# Patient Record
Sex: Female | Born: 1960 | Race: White | Hispanic: No | Marital: Single | Smoking: Never smoker
Health system: Southern US, Community
[De-identification: ages and names within clinical notes are randomized; demographics above are authoritative.]

## PROBLEM LIST (undated history)

## (undated) DIAGNOSIS — R51 Headache: Secondary | ICD-10-CM

## (undated) DIAGNOSIS — K5090 Crohn's disease, unspecified, without complications: Secondary | ICD-10-CM

## (undated) DIAGNOSIS — F319 Bipolar disorder, unspecified: Secondary | ICD-10-CM

## (undated) DIAGNOSIS — R569 Unspecified convulsions: Secondary | ICD-10-CM

## (undated) DIAGNOSIS — F329 Major depressive disorder, single episode, unspecified: Secondary | ICD-10-CM

## (undated) DIAGNOSIS — G43909 Migraine, unspecified, not intractable, without status migrainosus: Secondary | ICD-10-CM

## (undated) DIAGNOSIS — K259 Gastric ulcer, unspecified as acute or chronic, without hemorrhage or perforation: Secondary | ICD-10-CM

## (undated) DIAGNOSIS — K219 Gastro-esophageal reflux disease without esophagitis: Secondary | ICD-10-CM

## (undated) DIAGNOSIS — I10 Essential (primary) hypertension: Secondary | ICD-10-CM

## (undated) DIAGNOSIS — F32A Depression, unspecified: Secondary | ICD-10-CM

## (undated) DIAGNOSIS — E7800 Pure hypercholesterolemia, unspecified: Secondary | ICD-10-CM

## (undated) DIAGNOSIS — F419 Anxiety disorder, unspecified: Secondary | ICD-10-CM

## (undated) DIAGNOSIS — B029 Zoster without complications: Secondary | ICD-10-CM

## (undated) DIAGNOSIS — G8929 Other chronic pain: Secondary | ICD-10-CM

## (undated) HISTORY — PX: TONSILLECTOMY: SUR1361

## (undated) HISTORY — PX: STOMACH SURGERY: SHX791

## (undated) HISTORY — PX: CHOLECYSTECTOMY: SHX55

## (undated) HISTORY — PX: TUBAL LIGATION: SHX77

---

## 2004-08-03 ENCOUNTER — Emergency Department (HOSPITAL_COMMUNITY): Admission: EM | Admit: 2004-08-03 | Discharge: 2004-08-03 | Payer: Self-pay | Admitting: Emergency Medicine

## 2004-09-06 ENCOUNTER — Emergency Department (HOSPITAL_COMMUNITY): Admission: EM | Admit: 2004-09-06 | Discharge: 2004-09-06 | Payer: Self-pay | Admitting: Emergency Medicine

## 2004-10-08 ENCOUNTER — Emergency Department (HOSPITAL_COMMUNITY): Admission: EM | Admit: 2004-10-08 | Discharge: 2004-10-08 | Payer: Self-pay | Admitting: Emergency Medicine

## 2004-11-12 ENCOUNTER — Ambulatory Visit: Payer: Self-pay | Admitting: Internal Medicine

## 2004-11-12 ENCOUNTER — Inpatient Hospital Stay (HOSPITAL_COMMUNITY): Admission: EM | Admit: 2004-11-12 | Discharge: 2004-11-16 | Payer: Self-pay | Admitting: Emergency Medicine

## 2004-12-03 ENCOUNTER — Emergency Department (HOSPITAL_COMMUNITY): Admission: EM | Admit: 2004-12-03 | Discharge: 2004-12-03 | Payer: Self-pay | Admitting: Emergency Medicine

## 2004-12-23 ENCOUNTER — Emergency Department (HOSPITAL_COMMUNITY): Admission: EM | Admit: 2004-12-23 | Discharge: 2004-12-23 | Payer: Self-pay | Admitting: Emergency Medicine

## 2005-01-08 ENCOUNTER — Emergency Department (HOSPITAL_COMMUNITY): Admission: EM | Admit: 2005-01-08 | Discharge: 2005-01-08 | Payer: Self-pay | Admitting: Emergency Medicine

## 2005-01-20 ENCOUNTER — Inpatient Hospital Stay (HOSPITAL_COMMUNITY): Admission: EM | Admit: 2005-01-20 | Discharge: 2005-01-23 | Payer: Self-pay | Admitting: Emergency Medicine

## 2005-02-25 ENCOUNTER — Emergency Department (HOSPITAL_COMMUNITY): Admission: EM | Admit: 2005-02-25 | Discharge: 2005-02-25 | Payer: Self-pay | Admitting: Emergency Medicine

## 2005-04-29 ENCOUNTER — Emergency Department (HOSPITAL_COMMUNITY): Admission: EM | Admit: 2005-04-29 | Discharge: 2005-04-29 | Payer: Self-pay | Admitting: Emergency Medicine

## 2005-05-14 ENCOUNTER — Emergency Department (HOSPITAL_COMMUNITY): Admission: EM | Admit: 2005-05-14 | Discharge: 2005-05-14 | Payer: Self-pay | Admitting: Emergency Medicine

## 2005-05-15 ENCOUNTER — Emergency Department (HOSPITAL_COMMUNITY): Admission: EM | Admit: 2005-05-15 | Discharge: 2005-05-15 | Payer: Self-pay | Admitting: Emergency Medicine

## 2005-05-21 ENCOUNTER — Emergency Department (HOSPITAL_COMMUNITY): Admission: EM | Admit: 2005-05-21 | Discharge: 2005-05-21 | Payer: Self-pay | Admitting: Emergency Medicine

## 2005-05-23 ENCOUNTER — Observation Stay (HOSPITAL_COMMUNITY): Admission: EM | Admit: 2005-05-23 | Discharge: 2005-05-24 | Payer: Self-pay | Admitting: Emergency Medicine

## 2005-05-26 ENCOUNTER — Ambulatory Visit: Payer: Self-pay | Admitting: Family Medicine

## 2005-06-09 ENCOUNTER — Emergency Department (HOSPITAL_COMMUNITY): Admission: EM | Admit: 2005-06-09 | Discharge: 2005-06-09 | Payer: Self-pay | Admitting: Emergency Medicine

## 2005-06-10 ENCOUNTER — Emergency Department (HOSPITAL_COMMUNITY): Admission: EM | Admit: 2005-06-10 | Discharge: 2005-06-11 | Payer: Self-pay | Admitting: Emergency Medicine

## 2005-06-10 ENCOUNTER — Emergency Department (HOSPITAL_COMMUNITY): Admission: EM | Admit: 2005-06-10 | Discharge: 2005-06-10 | Payer: Self-pay | Admitting: Emergency Medicine

## 2005-06-14 ENCOUNTER — Emergency Department (HOSPITAL_COMMUNITY): Admission: EM | Admit: 2005-06-14 | Discharge: 2005-06-14 | Payer: Self-pay | Admitting: Emergency Medicine

## 2005-06-22 ENCOUNTER — Emergency Department (HOSPITAL_COMMUNITY): Admission: EM | Admit: 2005-06-22 | Discharge: 2005-06-22 | Payer: Self-pay | Admitting: Emergency Medicine

## 2005-07-07 ENCOUNTER — Emergency Department (HOSPITAL_COMMUNITY): Admission: EM | Admit: 2005-07-07 | Discharge: 2005-07-07 | Payer: Self-pay | Admitting: Emergency Medicine

## 2005-07-08 ENCOUNTER — Emergency Department (HOSPITAL_COMMUNITY): Admission: EM | Admit: 2005-07-08 | Discharge: 2005-07-08 | Payer: Self-pay | Admitting: Emergency Medicine

## 2005-07-09 ENCOUNTER — Observation Stay (HOSPITAL_COMMUNITY): Admission: EM | Admit: 2005-07-09 | Discharge: 2005-07-10 | Payer: Self-pay | Admitting: Emergency Medicine

## 2005-09-14 ENCOUNTER — Emergency Department (HOSPITAL_COMMUNITY): Admission: EM | Admit: 2005-09-14 | Discharge: 2005-09-14 | Payer: Self-pay | Admitting: Emergency Medicine

## 2005-10-12 ENCOUNTER — Emergency Department (HOSPITAL_COMMUNITY): Admission: EM | Admit: 2005-10-12 | Discharge: 2005-10-12 | Payer: Self-pay | Admitting: *Deleted

## 2005-10-16 ENCOUNTER — Emergency Department (HOSPITAL_COMMUNITY): Admission: EM | Admit: 2005-10-16 | Discharge: 2005-10-16 | Payer: Self-pay | Admitting: Emergency Medicine

## 2005-10-26 ENCOUNTER — Emergency Department (HOSPITAL_COMMUNITY): Admission: EM | Admit: 2005-10-26 | Discharge: 2005-10-26 | Payer: Self-pay | Admitting: Emergency Medicine

## 2005-11-06 ENCOUNTER — Emergency Department (HOSPITAL_COMMUNITY): Admission: EM | Admit: 2005-11-06 | Discharge: 2005-11-06 | Payer: Self-pay | Admitting: Emergency Medicine

## 2005-12-10 ENCOUNTER — Emergency Department (HOSPITAL_COMMUNITY): Admission: EM | Admit: 2005-12-10 | Discharge: 2005-12-10 | Payer: Self-pay | Admitting: Emergency Medicine

## 2005-12-15 ENCOUNTER — Emergency Department (HOSPITAL_COMMUNITY): Admission: EM | Admit: 2005-12-15 | Discharge: 2005-12-15 | Payer: Self-pay | Admitting: Emergency Medicine

## 2005-12-30 ENCOUNTER — Emergency Department (HOSPITAL_COMMUNITY): Admission: EM | Admit: 2005-12-30 | Discharge: 2005-12-30 | Payer: Self-pay | Admitting: Emergency Medicine

## 2006-01-06 ENCOUNTER — Ambulatory Visit: Payer: Self-pay | Admitting: Family Medicine

## 2006-01-06 ENCOUNTER — Emergency Department (HOSPITAL_COMMUNITY): Admission: EM | Admit: 2006-01-06 | Discharge: 2006-01-06 | Payer: Self-pay | Admitting: Emergency Medicine

## 2006-01-12 ENCOUNTER — Ambulatory Visit (HOSPITAL_COMMUNITY): Admission: RE | Admit: 2006-01-12 | Discharge: 2006-01-12 | Payer: Self-pay | Admitting: Emergency Medicine

## 2006-01-15 ENCOUNTER — Emergency Department (HOSPITAL_COMMUNITY): Admission: EM | Admit: 2006-01-15 | Discharge: 2006-01-15 | Payer: Self-pay | Admitting: Emergency Medicine

## 2006-01-20 ENCOUNTER — Ambulatory Visit: Payer: Self-pay | Admitting: Family Medicine

## 2006-01-21 ENCOUNTER — Ambulatory Visit (HOSPITAL_COMMUNITY): Admission: RE | Admit: 2006-01-21 | Discharge: 2006-01-21 | Payer: Self-pay | Admitting: Family Medicine

## 2006-01-22 ENCOUNTER — Emergency Department (HOSPITAL_COMMUNITY): Admission: EM | Admit: 2006-01-22 | Discharge: 2006-01-22 | Payer: Self-pay | Admitting: Emergency Medicine

## 2006-02-23 ENCOUNTER — Ambulatory Visit: Payer: Self-pay | Admitting: Family Medicine

## 2006-03-20 ENCOUNTER — Emergency Department (HOSPITAL_COMMUNITY): Admission: EM | Admit: 2006-03-20 | Discharge: 2006-03-20 | Payer: Self-pay | Admitting: Emergency Medicine

## 2006-03-26 ENCOUNTER — Ambulatory Visit: Payer: Self-pay | Admitting: Internal Medicine

## 2006-04-02 ENCOUNTER — Ambulatory Visit (HOSPITAL_COMMUNITY): Admission: RE | Admit: 2006-04-02 | Discharge: 2006-04-02 | Payer: Self-pay | Admitting: Internal Medicine

## 2006-04-09 ENCOUNTER — Emergency Department (HOSPITAL_COMMUNITY): Admission: EM | Admit: 2006-04-09 | Discharge: 2006-04-09 | Payer: Self-pay | Admitting: Emergency Medicine

## 2006-04-11 ENCOUNTER — Emergency Department (HOSPITAL_COMMUNITY): Admission: EM | Admit: 2006-04-11 | Discharge: 2006-04-11 | Payer: Self-pay | Admitting: Emergency Medicine

## 2006-04-14 ENCOUNTER — Ambulatory Visit (HOSPITAL_COMMUNITY): Admission: RE | Admit: 2006-04-14 | Discharge: 2006-04-14 | Payer: Self-pay | Admitting: Internal Medicine

## 2006-04-14 ENCOUNTER — Ambulatory Visit: Payer: Self-pay | Admitting: Internal Medicine

## 2006-04-27 ENCOUNTER — Emergency Department (HOSPITAL_COMMUNITY): Admission: EM | Admit: 2006-04-27 | Discharge: 2006-04-27 | Payer: Self-pay | Admitting: Emergency Medicine

## 2006-04-30 ENCOUNTER — Emergency Department (HOSPITAL_COMMUNITY): Admission: EM | Admit: 2006-04-30 | Discharge: 2006-04-30 | Payer: Self-pay | Admitting: Emergency Medicine

## 2006-05-03 ENCOUNTER — Ambulatory Visit: Payer: Self-pay | Admitting: Internal Medicine

## 2006-05-23 ENCOUNTER — Emergency Department (HOSPITAL_COMMUNITY): Admission: EM | Admit: 2006-05-23 | Discharge: 2006-05-23 | Payer: Self-pay | Admitting: Emergency Medicine

## 2006-06-15 ENCOUNTER — Emergency Department (HOSPITAL_COMMUNITY): Admission: EM | Admit: 2006-06-15 | Discharge: 2006-06-15 | Payer: Self-pay | Admitting: Emergency Medicine

## 2006-06-22 ENCOUNTER — Emergency Department (HOSPITAL_COMMUNITY): Admission: EM | Admit: 2006-06-22 | Discharge: 2006-06-22 | Payer: Self-pay | Admitting: Emergency Medicine

## 2006-06-24 ENCOUNTER — Ambulatory Visit: Payer: Self-pay | Admitting: Gastroenterology

## 2006-06-30 ENCOUNTER — Ambulatory Visit (HOSPITAL_COMMUNITY): Admission: RE | Admit: 2006-06-30 | Discharge: 2006-06-30 | Payer: Self-pay | Admitting: Gastroenterology

## 2006-06-30 ENCOUNTER — Ambulatory Visit: Payer: Self-pay | Admitting: Gastroenterology

## 2006-07-02 ENCOUNTER — Encounter (HOSPITAL_COMMUNITY): Admission: RE | Admit: 2006-07-02 | Discharge: 2006-08-01 | Payer: Self-pay | Admitting: Gastroenterology

## 2006-08-21 ENCOUNTER — Emergency Department (HOSPITAL_COMMUNITY): Admission: EM | Admit: 2006-08-21 | Discharge: 2006-08-21 | Payer: Self-pay | Admitting: Emergency Medicine

## 2006-08-23 ENCOUNTER — Ambulatory Visit: Payer: Self-pay | Admitting: Gastroenterology

## 2006-09-08 ENCOUNTER — Ambulatory Visit (HOSPITAL_COMMUNITY): Admission: RE | Admit: 2006-09-08 | Discharge: 2006-09-08 | Payer: Self-pay | Admitting: Gastroenterology

## 2006-09-08 ENCOUNTER — Ambulatory Visit: Payer: Self-pay | Admitting: Gastroenterology

## 2006-10-04 ENCOUNTER — Emergency Department (HOSPITAL_COMMUNITY): Admission: EM | Admit: 2006-10-04 | Discharge: 2006-10-05 | Payer: Self-pay | Admitting: *Deleted

## 2006-10-20 ENCOUNTER — Emergency Department (HOSPITAL_COMMUNITY): Admission: EM | Admit: 2006-10-20 | Discharge: 2006-10-20 | Payer: Self-pay | Admitting: Emergency Medicine

## 2006-12-05 ENCOUNTER — Emergency Department (HOSPITAL_COMMUNITY): Admission: EM | Admit: 2006-12-05 | Discharge: 2006-12-06 | Payer: Self-pay | Admitting: Emergency Medicine

## 2006-12-07 ENCOUNTER — Observation Stay (HOSPITAL_COMMUNITY): Admission: EM | Admit: 2006-12-07 | Discharge: 2006-12-09 | Payer: Self-pay | Admitting: Emergency Medicine

## 2007-01-08 ENCOUNTER — Emergency Department (HOSPITAL_COMMUNITY): Admission: EM | Admit: 2007-01-08 | Discharge: 2007-01-08 | Payer: Self-pay | Admitting: Emergency Medicine

## 2007-02-07 ENCOUNTER — Emergency Department (HOSPITAL_COMMUNITY): Admission: EM | Admit: 2007-02-07 | Discharge: 2007-02-07 | Payer: Self-pay | Admitting: Emergency Medicine

## 2007-02-23 ENCOUNTER — Emergency Department (HOSPITAL_COMMUNITY): Admission: EM | Admit: 2007-02-23 | Discharge: 2007-02-23 | Payer: Self-pay | Admitting: Emergency Medicine

## 2007-04-07 ENCOUNTER — Emergency Department (HOSPITAL_COMMUNITY): Admission: EM | Admit: 2007-04-07 | Discharge: 2007-04-07 | Payer: Self-pay | Admitting: Emergency Medicine

## 2007-04-19 ENCOUNTER — Emergency Department (HOSPITAL_COMMUNITY): Admission: EM | Admit: 2007-04-19 | Discharge: 2007-04-19 | Payer: Self-pay | Admitting: Emergency Medicine

## 2007-05-05 ENCOUNTER — Emergency Department (HOSPITAL_COMMUNITY): Admission: EM | Admit: 2007-05-05 | Discharge: 2007-05-05 | Payer: Self-pay | Admitting: Emergency Medicine

## 2007-05-09 ENCOUNTER — Emergency Department (HOSPITAL_COMMUNITY): Admission: EM | Admit: 2007-05-09 | Discharge: 2007-05-09 | Payer: Self-pay | Admitting: Emergency Medicine

## 2007-05-25 ENCOUNTER — Emergency Department (HOSPITAL_COMMUNITY): Admission: EM | Admit: 2007-05-25 | Discharge: 2007-05-25 | Payer: Self-pay | Admitting: Emergency Medicine

## 2007-05-26 ENCOUNTER — Emergency Department (HOSPITAL_COMMUNITY): Admission: EM | Admit: 2007-05-26 | Discharge: 2007-05-26 | Payer: Self-pay | Admitting: Emergency Medicine

## 2007-06-13 ENCOUNTER — Emergency Department (HOSPITAL_COMMUNITY): Admission: EM | Admit: 2007-06-13 | Discharge: 2007-06-13 | Payer: Self-pay | Admitting: Emergency Medicine

## 2007-06-17 ENCOUNTER — Emergency Department (HOSPITAL_COMMUNITY): Admission: EM | Admit: 2007-06-17 | Discharge: 2007-06-17 | Payer: Self-pay | Admitting: Emergency Medicine

## 2007-06-23 ENCOUNTER — Emergency Department (HOSPITAL_COMMUNITY): Admission: EM | Admit: 2007-06-23 | Discharge: 2007-06-23 | Payer: Self-pay | Admitting: Emergency Medicine

## 2007-07-02 ENCOUNTER — Emergency Department (HOSPITAL_COMMUNITY): Admission: EM | Admit: 2007-07-02 | Discharge: 2007-07-02 | Payer: Self-pay | Admitting: *Deleted

## 2007-07-05 ENCOUNTER — Emergency Department (HOSPITAL_COMMUNITY): Admission: EM | Admit: 2007-07-05 | Discharge: 2007-07-05 | Payer: Self-pay | Admitting: *Deleted

## 2007-07-21 ENCOUNTER — Emergency Department (HOSPITAL_COMMUNITY): Admission: EM | Admit: 2007-07-21 | Discharge: 2007-07-21 | Payer: Self-pay | Admitting: Emergency Medicine

## 2007-08-01 ENCOUNTER — Emergency Department (HOSPITAL_COMMUNITY): Admission: EM | Admit: 2007-08-01 | Discharge: 2007-08-01 | Payer: Self-pay | Admitting: Emergency Medicine

## 2007-08-10 ENCOUNTER — Emergency Department (HOSPITAL_COMMUNITY): Admission: EM | Admit: 2007-08-10 | Discharge: 2007-08-10 | Payer: Self-pay | Admitting: Emergency Medicine

## 2007-08-15 ENCOUNTER — Emergency Department (HOSPITAL_COMMUNITY): Admission: EM | Admit: 2007-08-15 | Discharge: 2007-08-15 | Payer: Self-pay | Admitting: Emergency Medicine

## 2007-08-19 ENCOUNTER — Emergency Department (HOSPITAL_COMMUNITY): Admission: EM | Admit: 2007-08-19 | Discharge: 2007-08-19 | Payer: Self-pay | Admitting: Emergency Medicine

## 2007-08-28 ENCOUNTER — Emergency Department (HOSPITAL_COMMUNITY): Admission: EM | Admit: 2007-08-28 | Discharge: 2007-08-28 | Payer: Self-pay | Admitting: Emergency Medicine

## 2007-09-07 ENCOUNTER — Emergency Department (HOSPITAL_COMMUNITY): Admission: EM | Admit: 2007-09-07 | Discharge: 2007-09-07 | Payer: Self-pay | Admitting: Emergency Medicine

## 2007-09-14 ENCOUNTER — Emergency Department (HOSPITAL_COMMUNITY): Admission: EM | Admit: 2007-09-14 | Discharge: 2007-09-14 | Payer: Self-pay | Admitting: Emergency Medicine

## 2007-09-16 ENCOUNTER — Emergency Department (HOSPITAL_COMMUNITY): Admission: EM | Admit: 2007-09-16 | Discharge: 2007-09-17 | Payer: Self-pay | Admitting: Emergency Medicine

## 2007-09-22 ENCOUNTER — Emergency Department (HOSPITAL_COMMUNITY): Admission: EM | Admit: 2007-09-22 | Discharge: 2007-09-22 | Payer: Self-pay | Admitting: Emergency Medicine

## 2007-10-15 ENCOUNTER — Emergency Department (HOSPITAL_COMMUNITY): Admission: EM | Admit: 2007-10-15 | Discharge: 2007-10-15 | Payer: Self-pay | Admitting: Emergency Medicine

## 2007-11-04 ENCOUNTER — Emergency Department (HOSPITAL_COMMUNITY): Admission: EM | Admit: 2007-11-04 | Discharge: 2007-11-04 | Payer: Self-pay | Admitting: Emergency Medicine

## 2007-11-26 ENCOUNTER — Emergency Department (HOSPITAL_COMMUNITY): Admission: EM | Admit: 2007-11-26 | Discharge: 2007-11-26 | Payer: Self-pay | Admitting: Emergency Medicine

## 2007-11-27 ENCOUNTER — Emergency Department (HOSPITAL_COMMUNITY): Admission: EM | Admit: 2007-11-27 | Discharge: 2007-11-27 | Payer: Self-pay | Admitting: Emergency Medicine

## 2007-11-27 ENCOUNTER — Emergency Department (HOSPITAL_COMMUNITY): Admission: EM | Admit: 2007-11-27 | Discharge: 2007-11-28 | Payer: Self-pay | Admitting: Emergency Medicine

## 2007-11-29 ENCOUNTER — Ambulatory Visit: Payer: Self-pay | Admitting: Internal Medicine

## 2007-12-25 ENCOUNTER — Emergency Department (HOSPITAL_COMMUNITY): Admission: EM | Admit: 2007-12-25 | Discharge: 2007-12-25 | Payer: Self-pay | Admitting: Emergency Medicine

## 2007-12-27 ENCOUNTER — Emergency Department (HOSPITAL_COMMUNITY): Admission: EM | Admit: 2007-12-27 | Discharge: 2007-12-27 | Payer: Self-pay | Admitting: Emergency Medicine

## 2008-01-19 ENCOUNTER — Ambulatory Visit: Payer: Self-pay | Admitting: Gastroenterology

## 2008-01-19 ENCOUNTER — Emergency Department (HOSPITAL_COMMUNITY): Admission: EM | Admit: 2008-01-19 | Discharge: 2008-01-19 | Payer: Self-pay | Admitting: Emergency Medicine

## 2008-02-24 ENCOUNTER — Emergency Department (HOSPITAL_COMMUNITY): Admission: EM | Admit: 2008-02-24 | Discharge: 2008-02-24 | Payer: Self-pay | Admitting: Emergency Medicine

## 2008-02-28 ENCOUNTER — Emergency Department (HOSPITAL_COMMUNITY): Admission: EM | Admit: 2008-02-28 | Discharge: 2008-02-28 | Payer: Self-pay | Admitting: Emergency Medicine

## 2008-02-29 ENCOUNTER — Emergency Department (HOSPITAL_COMMUNITY): Admission: EM | Admit: 2008-02-29 | Discharge: 2008-02-29 | Payer: Self-pay | Admitting: Emergency Medicine

## 2008-04-04 ENCOUNTER — Emergency Department (HOSPITAL_COMMUNITY): Admission: EM | Admit: 2008-04-04 | Discharge: 2008-04-04 | Payer: Self-pay | Admitting: Emergency Medicine

## 2008-05-04 ENCOUNTER — Emergency Department (HOSPITAL_COMMUNITY): Admission: EM | Admit: 2008-05-04 | Discharge: 2008-05-04 | Payer: Self-pay | Admitting: Emergency Medicine

## 2008-06-04 ENCOUNTER — Emergency Department (HOSPITAL_COMMUNITY): Admission: EM | Admit: 2008-06-04 | Discharge: 2008-06-04 | Payer: Self-pay | Admitting: Emergency Medicine

## 2008-06-06 ENCOUNTER — Emergency Department (HOSPITAL_COMMUNITY): Admission: EM | Admit: 2008-06-06 | Discharge: 2008-06-06 | Payer: Self-pay | Admitting: Emergency Medicine

## 2008-07-07 ENCOUNTER — Emergency Department (HOSPITAL_COMMUNITY): Admission: EM | Admit: 2008-07-07 | Discharge: 2008-07-07 | Payer: Self-pay | Admitting: Emergency Medicine

## 2008-08-25 ENCOUNTER — Emergency Department (HOSPITAL_COMMUNITY): Admission: EM | Admit: 2008-08-25 | Discharge: 2008-08-26 | Payer: Self-pay | Admitting: Emergency Medicine

## 2008-08-31 ENCOUNTER — Ambulatory Visit: Payer: Self-pay | Admitting: Gastroenterology

## 2008-10-06 ENCOUNTER — Emergency Department (HOSPITAL_COMMUNITY): Admission: EM | Admit: 2008-10-06 | Discharge: 2008-10-06 | Payer: Self-pay | Admitting: Emergency Medicine

## 2009-01-25 ENCOUNTER — Telehealth (INDEPENDENT_AMBULATORY_CARE_PROVIDER_SITE_OTHER): Payer: Self-pay

## 2009-01-25 ENCOUNTER — Ambulatory Visit: Payer: Self-pay | Admitting: Gastroenterology

## 2009-01-28 ENCOUNTER — Encounter: Payer: Self-pay | Admitting: Urgent Care

## 2009-01-30 ENCOUNTER — Telehealth (INDEPENDENT_AMBULATORY_CARE_PROVIDER_SITE_OTHER): Payer: Self-pay

## 2009-01-30 ENCOUNTER — Ambulatory Visit: Payer: Self-pay | Admitting: Gastroenterology

## 2009-02-01 ENCOUNTER — Telehealth (INDEPENDENT_AMBULATORY_CARE_PROVIDER_SITE_OTHER): Payer: Self-pay

## 2009-02-01 LAB — CONVERTED CEMR LAB
Eosinophils Absolute: 0.2 10*3/uL (ref 0.0–0.7)
Eosinophils Relative: 2 % (ref 0–5)
HCT: 31.8 % — ABNORMAL LOW (ref 36.0–46.0)
Hemoglobin: 9.2 g/dL — ABNORMAL LOW (ref 12.0–15.0)
Lymphocytes Relative: 41 % (ref 12–46)
Lymphs Abs: 3 10*3/uL (ref 0.7–4.0)
MCV: 73.8 fL — ABNORMAL LOW (ref 78.0–100.0)
Monocytes Absolute: 0.4 10*3/uL (ref 0.1–1.0)
Monocytes Relative: 5 % (ref 3–12)
RBC: 4.31 M/uL (ref 3.87–5.11)
Tissue Transglutaminase Ab, IgA: 0.4 units (ref <7)
UIBC: 512 ug/dL
Vitamin B-12: 446 pg/mL (ref 211–911)
WBC: 7.3 10*3/uL (ref 4.0–10.5)

## 2009-02-11 ENCOUNTER — Ambulatory Visit (HOSPITAL_COMMUNITY): Payer: Self-pay | Admitting: Oncology

## 2009-02-12 ENCOUNTER — Encounter: Payer: Self-pay | Admitting: Gastroenterology

## 2009-02-13 ENCOUNTER — Telehealth (INDEPENDENT_AMBULATORY_CARE_PROVIDER_SITE_OTHER): Payer: Self-pay

## 2009-02-14 ENCOUNTER — Encounter: Payer: Self-pay | Admitting: Gastroenterology

## 2009-02-14 ENCOUNTER — Ambulatory Visit (HOSPITAL_COMMUNITY): Admission: RE | Admit: 2009-02-14 | Discharge: 2009-02-14 | Payer: Self-pay | Admitting: Internal Medicine

## 2009-02-14 ENCOUNTER — Ambulatory Visit: Payer: Self-pay | Admitting: Gastroenterology

## 2009-02-18 ENCOUNTER — Telehealth: Payer: Self-pay | Admitting: Gastroenterology

## 2009-02-18 ENCOUNTER — Encounter: Payer: Self-pay | Admitting: Gastroenterology

## 2009-02-19 ENCOUNTER — Telehealth: Payer: Self-pay | Admitting: Gastroenterology

## 2009-02-19 ENCOUNTER — Ambulatory Visit (HOSPITAL_COMMUNITY): Admission: RE | Admit: 2009-02-19 | Discharge: 2009-02-19 | Payer: Self-pay | Admitting: Oncology

## 2009-02-19 ENCOUNTER — Encounter: Payer: Self-pay | Admitting: Gastroenterology

## 2009-02-22 ENCOUNTER — Encounter: Payer: Self-pay | Admitting: Gastroenterology

## 2009-02-25 ENCOUNTER — Encounter (INDEPENDENT_AMBULATORY_CARE_PROVIDER_SITE_OTHER): Payer: Self-pay

## 2009-02-25 ENCOUNTER — Ambulatory Visit (HOSPITAL_COMMUNITY): Admission: RE | Admit: 2009-02-25 | Discharge: 2009-02-25 | Payer: Self-pay | Admitting: Gastroenterology

## 2009-03-05 ENCOUNTER — Telehealth: Payer: Self-pay | Admitting: Gastroenterology

## 2009-03-05 ENCOUNTER — Encounter (INDEPENDENT_AMBULATORY_CARE_PROVIDER_SITE_OTHER): Payer: Self-pay | Admitting: *Deleted

## 2009-03-15 ENCOUNTER — Encounter: Payer: Self-pay | Admitting: Gastroenterology

## 2009-03-20 ENCOUNTER — Inpatient Hospital Stay (HOSPITAL_COMMUNITY): Admission: EM | Admit: 2009-03-20 | Discharge: 2009-03-27 | Payer: Self-pay | Admitting: Emergency Medicine

## 2009-03-21 ENCOUNTER — Ambulatory Visit: Payer: Self-pay | Admitting: Internal Medicine

## 2009-03-22 ENCOUNTER — Encounter: Payer: Self-pay | Admitting: Internal Medicine

## 2009-03-23 ENCOUNTER — Ambulatory Visit: Payer: Self-pay | Admitting: Gastroenterology

## 2009-03-25 ENCOUNTER — Ambulatory Visit: Payer: Self-pay | Admitting: Internal Medicine

## 2009-03-26 ENCOUNTER — Ambulatory Visit: Payer: Self-pay | Admitting: Gastroenterology

## 2009-03-27 ENCOUNTER — Ambulatory Visit: Payer: Self-pay | Admitting: Gastroenterology

## 2009-04-02 ENCOUNTER — Observation Stay (HOSPITAL_COMMUNITY): Admission: EM | Admit: 2009-04-02 | Discharge: 2009-04-03 | Payer: Self-pay | Admitting: Obstetrics and Gynecology

## 2009-04-04 ENCOUNTER — Encounter: Payer: Self-pay | Admitting: Gastroenterology

## 2009-04-09 ENCOUNTER — Telehealth (INDEPENDENT_AMBULATORY_CARE_PROVIDER_SITE_OTHER): Payer: Self-pay

## 2009-04-17 ENCOUNTER — Telehealth: Payer: Self-pay | Admitting: Gastroenterology

## 2009-05-02 ENCOUNTER — Encounter: Payer: Self-pay | Admitting: Gastroenterology

## 2009-05-02 ENCOUNTER — Telehealth (INDEPENDENT_AMBULATORY_CARE_PROVIDER_SITE_OTHER): Payer: Self-pay

## 2009-05-08 ENCOUNTER — Encounter: Payer: Self-pay | Admitting: Gastroenterology

## 2009-05-08 ENCOUNTER — Telehealth (INDEPENDENT_AMBULATORY_CARE_PROVIDER_SITE_OTHER): Payer: Self-pay

## 2009-05-27 ENCOUNTER — Inpatient Hospital Stay (HOSPITAL_COMMUNITY): Admission: EM | Admit: 2009-05-27 | Discharge: 2009-05-28 | Payer: Self-pay | Admitting: Emergency Medicine

## 2009-06-04 ENCOUNTER — Telehealth (INDEPENDENT_AMBULATORY_CARE_PROVIDER_SITE_OTHER): Payer: Self-pay

## 2009-06-07 ENCOUNTER — Emergency Department (HOSPITAL_COMMUNITY): Admission: EM | Admit: 2009-06-07 | Discharge: 2009-06-07 | Payer: Self-pay | Admitting: Emergency Medicine

## 2009-06-11 DIAGNOSIS — K3184 Gastroparesis: Secondary | ICD-10-CM

## 2009-06-11 DIAGNOSIS — R569 Unspecified convulsions: Secondary | ICD-10-CM

## 2009-06-11 DIAGNOSIS — K6289 Other specified diseases of anus and rectum: Secondary | ICD-10-CM

## 2009-06-11 DIAGNOSIS — K7689 Other specified diseases of liver: Secondary | ICD-10-CM

## 2009-06-11 DIAGNOSIS — Z8719 Personal history of other diseases of the digestive system: Secondary | ICD-10-CM

## 2009-06-11 DIAGNOSIS — K5900 Constipation, unspecified: Secondary | ICD-10-CM | POA: Insufficient documentation

## 2009-06-11 DIAGNOSIS — F329 Major depressive disorder, single episode, unspecified: Secondary | ICD-10-CM

## 2009-06-11 DIAGNOSIS — G589 Mononeuropathy, unspecified: Secondary | ICD-10-CM | POA: Insufficient documentation

## 2009-06-11 DIAGNOSIS — M549 Dorsalgia, unspecified: Secondary | ICD-10-CM | POA: Insufficient documentation

## 2009-06-11 DIAGNOSIS — M129 Arthropathy, unspecified: Secondary | ICD-10-CM | POA: Insufficient documentation

## 2009-06-11 DIAGNOSIS — R109 Unspecified abdominal pain: Secondary | ICD-10-CM | POA: Insufficient documentation

## 2009-06-11 DIAGNOSIS — J189 Pneumonia, unspecified organism: Secondary | ICD-10-CM

## 2009-06-11 DIAGNOSIS — I10 Essential (primary) hypertension: Secondary | ICD-10-CM | POA: Insufficient documentation

## 2009-06-11 DIAGNOSIS — N390 Urinary tract infection, site not specified: Secondary | ICD-10-CM

## 2009-06-11 DIAGNOSIS — F411 Generalized anxiety disorder: Secondary | ICD-10-CM | POA: Insufficient documentation

## 2009-06-11 DIAGNOSIS — G43909 Migraine, unspecified, not intractable, without status migrainosus: Secondary | ICD-10-CM | POA: Insufficient documentation

## 2009-06-11 DIAGNOSIS — D649 Anemia, unspecified: Secondary | ICD-10-CM | POA: Insufficient documentation

## 2009-06-29 ENCOUNTER — Emergency Department (HOSPITAL_COMMUNITY): Admission: EM | Admit: 2009-06-29 | Discharge: 2009-06-29 | Payer: Self-pay | Admitting: Emergency Medicine

## 2009-07-05 ENCOUNTER — Telehealth (INDEPENDENT_AMBULATORY_CARE_PROVIDER_SITE_OTHER): Payer: Self-pay | Admitting: *Deleted

## 2009-07-30 ENCOUNTER — Emergency Department (HOSPITAL_COMMUNITY): Admission: EM | Admit: 2009-07-30 | Discharge: 2009-07-30 | Payer: Self-pay | Admitting: Emergency Medicine

## 2009-08-17 ENCOUNTER — Emergency Department (HOSPITAL_COMMUNITY): Admission: EM | Admit: 2009-08-17 | Discharge: 2009-08-18 | Payer: Self-pay | Admitting: Emergency Medicine

## 2009-09-06 ENCOUNTER — Emergency Department (HOSPITAL_COMMUNITY): Admission: EM | Admit: 2009-09-06 | Discharge: 2009-09-06 | Payer: Self-pay | Admitting: Emergency Medicine

## 2009-10-06 ENCOUNTER — Inpatient Hospital Stay (HOSPITAL_COMMUNITY): Admission: EM | Admit: 2009-10-06 | Discharge: 2009-10-13 | Payer: Self-pay | Admitting: Emergency Medicine

## 2009-10-09 ENCOUNTER — Encounter: Payer: Self-pay | Admitting: Gastroenterology

## 2009-12-01 ENCOUNTER — Emergency Department (HOSPITAL_COMMUNITY): Admission: EM | Admit: 2009-12-01 | Discharge: 2009-12-01 | Payer: Self-pay | Admitting: Emergency Medicine

## 2009-12-03 ENCOUNTER — Emergency Department (HOSPITAL_COMMUNITY): Admission: EM | Admit: 2009-12-03 | Discharge: 2009-12-03 | Payer: Self-pay | Admitting: Emergency Medicine

## 2010-02-15 ENCOUNTER — Emergency Department (HOSPITAL_COMMUNITY): Admission: EM | Admit: 2010-02-15 | Discharge: 2010-02-15 | Payer: Self-pay | Admitting: Emergency Medicine

## 2010-04-10 ENCOUNTER — Telehealth (INDEPENDENT_AMBULATORY_CARE_PROVIDER_SITE_OTHER): Payer: Self-pay

## 2010-04-21 ENCOUNTER — Encounter: Payer: Self-pay | Admitting: Urgent Care

## 2010-05-05 ENCOUNTER — Telehealth (INDEPENDENT_AMBULATORY_CARE_PROVIDER_SITE_OTHER): Payer: Self-pay

## 2010-05-15 ENCOUNTER — Encounter (INDEPENDENT_AMBULATORY_CARE_PROVIDER_SITE_OTHER): Payer: Self-pay | Admitting: *Deleted

## 2010-05-15 ENCOUNTER — Inpatient Hospital Stay (HOSPITAL_COMMUNITY): Admission: EM | Admit: 2010-05-15 | Discharge: 2010-05-17 | Payer: Self-pay | Admitting: Emergency Medicine

## 2010-05-30 ENCOUNTER — Encounter: Payer: Self-pay | Admitting: Internal Medicine

## 2010-06-06 ENCOUNTER — Encounter: Admission: RE | Admit: 2010-06-06 | Discharge: 2010-06-06 | Payer: Self-pay | Admitting: Family Medicine

## 2010-06-26 ENCOUNTER — Emergency Department (HOSPITAL_COMMUNITY): Admission: EM | Admit: 2010-06-26 | Discharge: 2010-06-26 | Payer: Self-pay | Admitting: Emergency Medicine

## 2010-07-16 ENCOUNTER — Ambulatory Visit: Payer: Self-pay | Admitting: Vascular Surgery

## 2010-07-23 ENCOUNTER — Ambulatory Visit: Payer: Self-pay | Admitting: Vascular Surgery

## 2010-07-28 ENCOUNTER — Emergency Department (HOSPITAL_COMMUNITY): Admission: EM | Admit: 2010-07-28 | Discharge: 2010-07-28 | Payer: Self-pay | Admitting: Emergency Medicine

## 2010-10-25 ENCOUNTER — Emergency Department (HOSPITAL_COMMUNITY): Admission: EM | Admit: 2010-10-25 | Discharge: 2010-10-26 | Payer: Self-pay | Admitting: Emergency Medicine

## 2010-12-21 ENCOUNTER — Encounter: Payer: Self-pay | Admitting: Internal Medicine

## 2010-12-21 ENCOUNTER — Encounter: Payer: Self-pay | Admitting: Preventative Medicine

## 2010-12-21 ENCOUNTER — Encounter: Payer: Self-pay | Admitting: Emergency Medicine

## 2010-12-31 NOTE — Letter (Signed)
Summary: DC from practice acknowledged  DC from practice acknowledged   Imported By: Minna Merritts 05/30/2010 11:29:50  _____________________________________________________________________  External Attachment:    Type:   Image     Comment:   External Document

## 2010-12-31 NOTE — Progress Notes (Signed)
Summary: pain rx  Phone Note Call from Patient Call back at Home Phone [phone]   Caller: Patient Summary of Call: pt called- she has had a headache with vomiting and diarrhea (? migraine) for 2 days. pt does not have a pcp. She stated she has tried to take excederin and bc powders and it made her have increased diarrhea. She also said she was trying to drink ensure and couldnt keep it down. informed her she should stay away from dairy products when having vomiting and diarrhea. also advise clear liquids untill diarrhea stops and them brat diet and tylenol for her headache. pt stated she didnt think that would work and wanted something called in for pain. Advised pt that she has not been seen since 12/2008 and has "no-showed" several appts and that I could not guarantee that we could call in anything. pt stated she was aware of this. please advise.. Initial call taken by: Hendricks Limes LPN,  Apr 10, 2010 12:25 PM     Appended Document: pain rx Pt need to go to ED if she needs to be evaluated. We would be more than happy to make a follow up appt. She needs an E:30 visit and 1st available slot. Because she has not been seen in over a year, we cannot call in any meds for her. She may use Tylenol and/or Imodium as needed for diarrhea.  Appended Document: pain rx tried to call pt- LMOM  Appended Document: pain rx LMOM to call.  Appended Document: pain rx Pt called and was informed. Said she is doing some better, but would like an appt.  Appended Document: pain rx Pt on the schedule for 05/12/10@11 :00 a.m.,called pt, lmom.

## 2010-12-31 NOTE — Medication Information (Signed)
Summary: Tax adviser   Imported By: Diana Eves 04/21/2010 11:51:35  _____________________________________________________________________  External Attachment:    Type:   Image     Comment:   External Document  Appended Document: RX Folder Please let pt know she needs to be seen 1st  Appended Document: RX Folder tried to call pt NA- pharmacy aware to let pt know

## 2010-12-31 NOTE — Progress Notes (Signed)
Summary: phone note...Marland KitchenMarland Kitchenn/v diarrhea/ abd pain  Phone Note Call from Patient   Caller: Patient Summary of Call: Pt called and said she is having n/v and diarrhea today. She also has a migraine  (was seen in ER yesterday for that).  She said she has had very bad abdominal pain. Only had baby food to eat. Please advise.  [phone])   uses [employer]. Initial call taken by: Cloria Spring LPN,  May 05, 5620 3:31 PM     Appended Document: phone note...Marland KitchenMarland Kitchenn/v diarrhea/ abd pain VM from pt just now, pleading for pain med to be called in for her. (sending Dr. Darrick Penna page)  Appended Document: phone note...Marland KitchenMarland Kitchenn/v diarrhea/ abd pain LMOM  i have not heard from Dr. Darrick Penna, if she is hurting that bad, she might should go to the hospital.  Appended Document: phone note...Marland KitchenMarland Kitchenn/v diarrhea/ abd pain Pt has not been seen in over a year. She needs 1st available OPV, E:30, RE: abd pain. She needs to see her PCP or go to the ED for evaluation until then.  Appended Document: phone note...Marland KitchenMarland Kitchenn/v diarrhea/ abd pain tried to call pt- LMOM  Appended Document: phone note...Marland KitchenMarland Kitchenn/v diarrhea/ abd pain pt aware.pt wanted me to let SLF know that she doesnt have a PCP and went to the hosp in [place], [place] the other day. pt is claiming that there is something wrong with her stomach and causing her to double over in pain. She has appt on 05/12/10. would like to be informed if anyone cancels their appt.  Appended Document: phone note...Marland KitchenMarland Kitchenn/v diarrhea/ abd pain Obtain records from ED in [facility].  Appended Document: phone note...Marland KitchenMarland Kitchenn/v diarrhea/ abd pain I tried calling pt at [phone], but the voice mailbox is full where I couldnt leave a message. I need to get pt to sign a release of information before I can get any records from [facility] ED.   Appended Document: phone note...Marland KitchenMarland Kitchenn/v diarrhea/ abd pain Pt called Friday 6/10 and I told her that I needed for her to call [facility] to see if they would fax [place]  the records of her visit or for her to sign release on Monday when she comes to her appt.. Pt was aware of appointment for 6/13 w/KJ

## 2010-12-31 NOTE — Letter (Signed)
Summary: Patient Discharge Letter  [facility]  [address]   [place], [place] [address]   Phone: [phone]  Fax: [phone]    May 15, 2010  [name] [address] [place], [place]  [address] [birth year]   Dear Ms. Brassell,   This is to inform you our intention to terminate the physician/patient relationship.  The physician/patient relationship is no longer productive due to the frequency of your missed appointments.  We we will no longer be responsible for your care effective July 15,2011.  Upon proper authorization, we will be glad to provide a copy of your medical records to the Gastroenterologist of your choice.  You may consult your family physician and/or yellow pages of the phone directory to find another qualified Gastroenterologist to provide your care.  It is important that you follow up with another Gastroenterologist for your medical needs.  Please do not neglect your health.   Best regards,      Sandi L. Darrick Penna, MD [facility] Ph: [phone]    Fax: [phone]

## 2011-02-15 LAB — BASIC METABOLIC PANEL
CO2: 22 mEq/L (ref 19–32)
CO2: 24 mEq/L (ref 19–32)
CO2: 20 mEq/L (ref 19–32)
Calcium: 9.7 mg/dL (ref 8.4–10.5)
Calcium: 9.1 mg/dL (ref 8.4–10.5)
Chloride: 104 mEq/L (ref 96–112)
Chloride: 99 mEq/L (ref 96–112)
GFR calc Af Amer: 46 mL/min — ABNORMAL LOW (ref >60)
GFR calc Af Amer: >60 mL/min (ref >60)
GFR calc Af Amer: 9 mL/min — ABNORMAL LOW (ref >60)
GFR calc non Af Amer: 59 mL/min — ABNORMAL LOW (ref >60)
Glucose, Bld: 104 mg/dL — ABNORMAL HIGH (ref 70–99)
Potassium: 3.7 mEq/L (ref 3.5–5.1)
Sodium: 133 mEq/L — ABNORMAL LOW (ref 135–145)
Sodium: 137 mEq/L (ref 135–145)
Sodium: 131 mEq/L — ABNORMAL LOW (ref 135–145)

## 2011-02-15 LAB — RENAL FUNCTION PANEL
BUN: 14 mg/dL (ref 6–23)
Chloride: 114 mEq/L — ABNORMAL HIGH (ref 96–112)
Creatinine, Ser: 1.05 mg/dL (ref 0.4–1.2)
Glucose, Bld: 114 mg/dL — ABNORMAL HIGH (ref 70–99)
Potassium: 4.6 mEq/L (ref 3.5–5.1)

## 2011-02-15 LAB — RETICULOCYTES
RBC.: 3.25 MIL/uL — ABNORMAL LOW (ref 3.87–5.11)
Retic Count, Absolute: 68.3 10*3/uL (ref 19.0–186.0)
Retic Ct Pct: 2.1 % (ref 0.4–3.1)

## 2011-02-15 LAB — COMPREHENSIVE METABOLIC PANEL
ALT: 17 U/L (ref 0–35)
ALT: 18 U/L (ref 0–35)
AST: 14 U/L (ref 0–37)
Albumin: 3.5 g/dL (ref 3.5–5.2)
BUN: 49 mg/dL — ABNORMAL HIGH (ref 6–23)
CO2: 19 mEq/L (ref 19–32)
CO2: 22 mEq/L (ref 19–32)
Calcium: 9 mg/dL (ref 8.4–10.5)
Chloride: 110 mEq/L (ref 96–112)
Creatinine, Ser: 1.98 mg/dL — ABNORMAL HIGH (ref 0.4–1.2)
Creatinine, Ser: 3.79 mg/dL — ABNORMAL HIGH (ref 0.4–1.2)
GFR calc Af Amer: 32 mL/min — ABNORMAL LOW (ref >60)
GFR calc non Af Amer: 13 mL/min — ABNORMAL LOW (ref >60)
GFR calc non Af Amer: 27 mL/min — ABNORMAL LOW (ref >60)
Glucose, Bld: 88 mg/dL (ref 70–99)
Sodium: 130 mEq/L — ABNORMAL LOW (ref 135–145)
Sodium: 137 mEq/L (ref 135–145)
Total Bilirubin: 0.6 mg/dL (ref 0.3–1.2)
Total Protein: 7.1 g/dL (ref 6.0–8.3)

## 2011-02-15 LAB — CBC
HCT: 33.7 % — ABNORMAL LOW (ref 36.0–46.0)
Hemoglobin: 11.5 g/dL — ABNORMAL LOW (ref 12.0–15.0)
Hemoglobin: 9.8 g/dL — ABNORMAL LOW (ref 12.0–15.0)
MCHC: 34 g/dL (ref 30.0–36.0)
MCHC: 34.7 g/dL (ref 30.0–36.0)
Platelets: 234 10*3/uL (ref 150–400)
RBC: 3.97 MIL/uL (ref 3.87–5.11)
RBC: 3.16 MIL/uL — ABNORMAL LOW (ref 3.87–5.11)
RBC: 3.33 MIL/uL — ABNORMAL LOW (ref 3.87–5.11)
RDW: 19.6 % — ABNORMAL HIGH (ref 11.5–15.5)
WBC: 5.1 10*3/uL (ref 4.0–10.5)
WBC: 7.3 10*3/uL (ref 4.0–10.5)

## 2011-02-15 LAB — DIFFERENTIAL
Band Neutrophils: 0 % (ref 0–10)
Basophils Absolute: 0 10*3/uL (ref 0.0–0.1)
Basophils Absolute: 0 10*3/uL (ref 0.0–0.1)
Basophils Relative: 0 % (ref 0–1)
Basophils Relative: 0 % (ref 0–1)
Eosinophils Absolute: 0.2 10*3/uL (ref 0.0–0.7)
Eosinophils Relative: 5 % (ref 0–5)
Lymphocytes Relative: 34 % (ref 12–46)
Lymphocytes Relative: 42 % (ref 12–46)
Lymphs Abs: 3.4 10*3/uL (ref 0.7–4.0)
Lymphs Abs: 2.1 10*3/uL (ref 0.7–4.0)
Lymphs Abs: 2.2 10*3/uL (ref 0.7–4.0)
Metamyelocytes Relative: 0 %
Monocytes Absolute: 0.2 10*3/uL (ref 0.1–1.0)
Monocytes Absolute: 0.4 10*3/uL (ref 0.1–1.0)
Monocytes Absolute: 0.5 10*3/uL (ref 0.1–1.0)
Monocytes Relative: 2 % — ABNORMAL LOW (ref 3–12)
Monocytes Relative: 8 % (ref 3–12)
Neutro Abs: 4.2 10*3/uL (ref 1.7–7.7)
Neutrophils Relative %: 57 % (ref 43–77)
Smear Review: ADEQUATE

## 2011-02-15 LAB — URINALYSIS, ROUTINE W REFLEX MICROSCOPIC
Bilirubin Urine: NEGATIVE
Bilirubin Urine: NEGATIVE
Glucose, UA: NEGATIVE mg/dL
Hgb urine dipstick: NEGATIVE
Hgb urine dipstick: NEGATIVE
Ketones, ur: NEGATIVE mg/dL
Protein, ur: NEGATIVE mg/dL
Specific Gravity, Urine: 1.025 (ref 1.005–1.030)
Urobilinogen, UA: 0.2 mg/dL (ref 0.0–1.0)
Urobilinogen, UA: 0.2 mg/dL (ref 0.0–1.0)
pH: 5 (ref 5.0–8.0)

## 2011-02-15 LAB — RAPID URINE DRUG SCREEN, HOSP PERFORMED
Amphetamines: NOT DETECTED
Barbiturates: NOT DETECTED
Barbiturates: NOT DETECTED
Benzodiazepines: POSITIVE — AB
Cocaine: NOT DETECTED
Opiates: NOT DETECTED
Tetrahydrocannabinol: NOT DETECTED

## 2011-02-15 LAB — ETHANOL
Alcohol, Ethyl (B): <5 mg/dL (ref 0–10)
Alcohol, Ethyl (B): <5 mg/dL (ref 0–10)

## 2011-02-15 LAB — MAGNESIUM: Magnesium: 2.1 mg/dL (ref 1.5–2.5)

## 2011-02-15 LAB — IRON AND TIBC
Iron: 62 ug/dL (ref 42–135)
TIBC: 381 ug/dL (ref 250–470)
UIBC: 319 ug/dL

## 2011-02-15 LAB — TSH: TSH: 0.894 u[IU]/mL (ref 0.350–4.500)

## 2011-02-15 LAB — URINE MICROSCOPIC-ADD ON

## 2011-02-15 LAB — PREGNANCY, URINE: Preg Test, Ur: NEGATIVE

## 2011-03-04 LAB — CBC
HCT: 25.1 % — ABNORMAL LOW (ref 36.0–46.0)
HCT: 28.3 % — ABNORMAL LOW (ref 36.0–46.0)
HCT: 29.2 % — ABNORMAL LOW (ref 36.0–46.0)
HCT: 29.2 % — ABNORMAL LOW (ref 36.0–46.0)
Hemoglobin: 8.5 g/dL — ABNORMAL LOW (ref 12.0–15.0)
Hemoglobin: 9.5 g/dL — ABNORMAL LOW (ref 12.0–15.0)
Hemoglobin: 9.7 g/dL — ABNORMAL LOW (ref 12.0–15.0)
Hemoglobin: 9.9 g/dL — ABNORMAL LOW (ref 12.0–15.0)
MCHC: 33.2 g/dL (ref 30.0–36.0)
MCHC: 33.9 g/dL (ref 30.0–36.0)
MCHC: 34 g/dL (ref 30.0–36.0)
MCHC: 34 g/dL (ref 30.0–36.0)
MCHC: 34.2 g/dL (ref 30.0–36.0)
MCV: 79.5 fL (ref 78.0–100.0)
MCV: 83.6 fL (ref 78.0–100.0)
MCV: 83.6 fL (ref 78.0–100.0)
Platelets: 433 10*3/uL — ABNORMAL HIGH (ref 150–400)
Platelets: 442 10*3/uL — ABNORMAL HIGH (ref 150–400)
Platelets: 461 10*3/uL — ABNORMAL HIGH (ref 150–400)
Platelets: 462 10*3/uL — ABNORMAL HIGH (ref 150–400)
RBC: 3.16 MIL/uL — ABNORMAL LOW (ref 3.87–5.11)
RBC: 3.48 MIL/uL — ABNORMAL LOW (ref 3.87–5.11)
RDW: 17.3 % — ABNORMAL HIGH (ref 11.5–15.5)
RDW: 17.4 % — ABNORMAL HIGH (ref 11.5–15.5)
RDW: 17.5 % — ABNORMAL HIGH (ref 11.5–15.5)
RDW: 17.7 % — ABNORMAL HIGH (ref 11.5–15.5)
WBC: 10.7 10*3/uL — ABNORMAL HIGH (ref 4.0–10.5)
WBC: 9.9 10*3/uL (ref 4.0–10.5)

## 2011-03-04 LAB — URINE CULTURE

## 2011-03-04 LAB — RAPID URINE DRUG SCREEN, HOSP PERFORMED
Benzodiazepines: POSITIVE — AB
Cocaine: NOT DETECTED
Opiates: NOT DETECTED
Tetrahydrocannabinol: NOT DETECTED

## 2011-03-04 LAB — CROSSMATCH: ABO/RH(D): AB POS

## 2011-03-04 LAB — DIFFERENTIAL
Basophils Absolute: 0 10*3/uL (ref 0.0–0.1)
Basophils Absolute: 0 10*3/uL (ref 0.0–0.1)
Basophils Absolute: 0 10*3/uL (ref 0.0–0.1)
Basophils Absolute: 0 10*3/uL (ref 0.0–0.1)
Basophils Absolute: 0 10*3/uL (ref 0.0–0.1)
Basophils Relative: 0 % (ref 0–1)
Basophils Relative: 0 % (ref 0–1)
Basophils Relative: 0 % (ref 0–1)
Basophils Relative: 0 % (ref 0–1)
Basophils Relative: 1 % (ref 0–1)
Eosinophils Absolute: 0 10*3/uL (ref 0.0–0.7)
Eosinophils Absolute: 0.1 10*3/uL (ref 0.0–0.7)
Eosinophils Absolute: 0.1 10*3/uL (ref 0.0–0.7)
Eosinophils Absolute: 0.2 10*3/uL (ref 0.0–0.7)
Eosinophils Relative: 1 % (ref 0–5)
Eosinophils Relative: 1 % (ref 0–5)
Eosinophils Relative: 2 % (ref 0–5)
Eosinophils Relative: 2 % (ref 0–5)
Lymphocytes Relative: 21 % (ref 12–46)
Lymphocytes Relative: 22 % (ref 12–46)
Lymphs Abs: 1.2 10*3/uL (ref 0.7–4.0)
Lymphs Abs: 1.5 10*3/uL (ref 0.7–4.0)
Monocytes Absolute: 0.4 10*3/uL (ref 0.1–1.0)
Monocytes Absolute: 0.6 10*3/uL (ref 0.1–1.0)
Monocytes Absolute: 0.6 10*3/uL (ref 0.1–1.0)
Monocytes Absolute: 0.9 10*3/uL (ref 0.1–1.0)
Monocytes Relative: 7 % (ref 3–12)
Monocytes Relative: 9 % (ref 3–12)
Monocytes Relative: 9 % (ref 3–12)
Neutro Abs: 11.9 10*3/uL — ABNORMAL HIGH (ref 1.7–7.7)
Neutro Abs: 8.3 10*3/uL — ABNORMAL HIGH (ref 1.7–7.7)
Neutrophils Relative %: 78 % — ABNORMAL HIGH (ref 43–77)
Neutrophils Relative %: 78 % — ABNORMAL HIGH (ref 43–77)
Neutrophils Relative %: 81 % — ABNORMAL HIGH (ref 43–77)

## 2011-03-04 LAB — COMPREHENSIVE METABOLIC PANEL
ALT: 42 U/L — ABNORMAL HIGH (ref 0–35)
ALT: 49 U/L — ABNORMAL HIGH (ref 0–35)
AST: 18 U/L (ref 0–37)
AST: 38 U/L — ABNORMAL HIGH (ref 0–37)
Albumin: 2.6 g/dL — ABNORMAL LOW (ref 3.5–5.2)
Alkaline Phosphatase: 98 U/L (ref 39–117)
BUN: 75 mg/dL — ABNORMAL HIGH (ref 6–23)
CO2: 16 mEq/L — ABNORMAL LOW (ref 19–32)
Calcium: 8.2 mg/dL — ABNORMAL LOW (ref 8.4–10.5)
Calcium: 8.7 mg/dL (ref 8.4–10.5)
Chloride: 106 mEq/L (ref 96–112)
GFR calc Af Amer: 24 mL/min — ABNORMAL LOW (ref >60)
GFR calc Af Amer: 6 mL/min — ABNORMAL LOW (ref >60)
GFR calc non Af Amer: 4 mL/min — ABNORMAL LOW (ref >60)
Glucose, Bld: 101 mg/dL — ABNORMAL HIGH (ref 70–99)
Potassium: 4.1 mEq/L (ref 3.5–5.1)
Sodium: 132 mEq/L — ABNORMAL LOW (ref 135–145)
Sodium: 133 mEq/L — ABNORMAL LOW (ref 135–145)
Sodium: 146 mEq/L — ABNORMAL HIGH (ref 135–145)
Total Bilirubin: 0.6 mg/dL (ref 0.3–1.2)
Total Protein: 6.2 g/dL (ref 6.0–8.3)

## 2011-03-04 LAB — RENAL FUNCTION PANEL
Albumin: 2.6 g/dL — ABNORMAL LOW (ref 3.5–5.2)
Albumin: 2.8 g/dL — ABNORMAL LOW (ref 3.5–5.2)
Albumin: 3.1 g/dL — ABNORMAL LOW (ref 3.5–5.2)
BUN: 10 mg/dL (ref 6–23)
BUN: 18 mg/dL (ref 6–23)
BUN: 6 mg/dL (ref 6–23)
BUN: 6 mg/dL (ref 6–23)
CO2: 29 mEq/L (ref 19–32)
CO2: 29 mEq/L (ref 19–32)
CO2: 33 mEq/L — ABNORMAL HIGH (ref 19–32)
Calcium: 7.9 mg/dL — ABNORMAL LOW (ref 8.4–10.5)
Calcium: 8 mg/dL — ABNORMAL LOW (ref 8.4–10.5)
Calcium: 8.3 mg/dL — ABNORMAL LOW (ref 8.4–10.5)
Calcium: 9 mg/dL (ref 8.4–10.5)
Chloride: 104 mEq/L (ref 96–112)
Chloride: 106 mEq/L (ref 96–112)
Creatinine, Ser: 1.08 mg/dL (ref 0.4–1.2)
Creatinine, Ser: 1.2 mg/dL (ref 0.4–1.2)
Creatinine, Ser: 1.32 mg/dL — ABNORMAL HIGH (ref 0.4–1.2)
Creatinine, Ser: 1.49 mg/dL — ABNORMAL HIGH (ref 0.4–1.2)
GFR calc Af Amer: 45 mL/min — ABNORMAL LOW (ref >60)
GFR calc Af Amer: 58 mL/min — ABNORMAL LOW (ref >60)
GFR calc Af Amer: >60 mL/min (ref >60)
GFR calc non Af Amer: 37 mL/min — ABNORMAL LOW (ref >60)
GFR calc non Af Amer: 43 mL/min — ABNORMAL LOW (ref >60)
GFR calc non Af Amer: 9 mL/min — ABNORMAL LOW (ref >60)
Glucose, Bld: 101 mg/dL — ABNORMAL HIGH (ref 70–99)
Glucose, Bld: 95 mg/dL (ref 70–99)
Phosphorus: 4 mg/dL (ref 2.3–4.6)
Phosphorus: 5.4 mg/dL — ABNORMAL HIGH (ref 2.3–4.6)
Potassium: 3.7 mEq/L (ref 3.5–5.1)
Sodium: 139 mEq/L (ref 135–145)

## 2011-03-04 LAB — URINE MICROSCOPIC-ADD ON

## 2011-03-04 LAB — IRON AND TIBC
Iron: 22 ug/dL — ABNORMAL LOW (ref 42–135)
Saturation Ratios: 7 % — ABNORMAL LOW (ref 20–55)
UIBC: 291 ug/dL

## 2011-03-04 LAB — LIPASE, BLOOD: Lipase: 28 U/L (ref 11–59)

## 2011-03-04 LAB — TSH
TSH: 0.269 u[IU]/mL — ABNORMAL LOW (ref 0.350–4.500)
TSH: 1.625 u[IU]/mL (ref 0.350–4.500)

## 2011-03-04 LAB — VITAMIN B12: Vitamin B-12: 334 pg/mL (ref 211–911)

## 2011-03-04 LAB — TYPE AND SCREEN: ABO/RH(D): AB POS

## 2011-03-04 LAB — URINALYSIS, ROUTINE W REFLEX MICROSCOPIC
Glucose, UA: NEGATIVE mg/dL
Specific Gravity, Urine: >1.03 — ABNORMAL HIGH (ref 1.005–1.030)

## 2011-03-04 LAB — T3: T3, Total: 80.3 ng/dl (ref 80.0–204.0)

## 2011-03-04 LAB — HEMOCCULT GUIAC POC 1CARD (OFFICE): Fecal Occult Bld: NEGATIVE

## 2011-03-04 LAB — PROTIME-INR: Prothrombin Time: 14.1 seconds (ref 11.6–15.2)

## 2011-03-04 LAB — ETHANOL: Alcohol, Ethyl (B): <5 mg/dL (ref 0–10)

## 2011-03-08 LAB — CBC
Hemoglobin: 11 g/dL — ABNORMAL LOW (ref 12.0–15.0)
Platelets: 224 10*3/uL (ref 150–400)
RDW: 17.8 % — ABNORMAL HIGH (ref 11.5–15.5)
WBC: 8.9 10*3/uL (ref 4.0–10.5)

## 2011-03-08 LAB — HEPATIC FUNCTION PANEL
ALT: 14 U/L (ref 0–35)
AST: 19 U/L (ref 0–37)
Total Protein: 7.5 g/dL (ref 6.0–8.3)

## 2011-03-08 LAB — DIFFERENTIAL
Basophils Absolute: 0 10*3/uL (ref 0.0–0.1)
Lymphocytes Relative: 37 % (ref 12–46)
Monocytes Absolute: 0.7 10*3/uL (ref 0.1–1.0)
Neutro Abs: 4.7 10*3/uL (ref 1.7–7.7)

## 2011-03-08 LAB — URINALYSIS, ROUTINE W REFLEX MICROSCOPIC
Bilirubin Urine: NEGATIVE
Nitrite: NEGATIVE
Specific Gravity, Urine: 1.01 (ref 1.005–1.030)
Urobilinogen, UA: 0.2 mg/dL (ref 0.0–1.0)

## 2011-03-08 LAB — BASIC METABOLIC PANEL
Calcium: 9.7 mg/dL (ref 8.4–10.5)
GFR calc non Af Amer: 57 mL/min — ABNORMAL LOW (ref >60)
Glucose, Bld: 85 mg/dL (ref 70–99)
Sodium: 138 mEq/L (ref 135–145)

## 2011-03-09 LAB — COMPREHENSIVE METABOLIC PANEL
ALT: 24 U/L (ref 0–35)
ALT: 24 U/L (ref 0–35)
Alkaline Phosphatase: 98 U/L (ref 39–117)
BUN: 17 mg/dL (ref 6–23)
Calcium: 8.7 mg/dL (ref 8.4–10.5)
Chloride: 111 mEq/L (ref 96–112)
Creatinine, Ser: 1.02 mg/dL (ref 0.4–1.2)
Glucose, Bld: 107 mg/dL — ABNORMAL HIGH (ref 70–99)
Glucose, Bld: 82 mg/dL (ref 70–99)
Potassium: 4 mEq/L (ref 3.5–5.1)
Sodium: 135 mEq/L (ref 135–145)
Sodium: 140 mEq/L (ref 135–145)
Total Protein: 6.4 g/dL (ref 6.0–8.3)
Total Protein: 6.6 g/dL (ref 6.0–8.3)

## 2011-03-09 LAB — CBC
HCT: 30.5 % — ABNORMAL LOW (ref 36.0–46.0)
Hemoglobin: 9.2 g/dL — ABNORMAL LOW (ref 12.0–15.0)
MCHC: 34.2 g/dL (ref 30.0–36.0)
MCV: 77.7 fL — ABNORMAL LOW (ref 78.0–100.0)
MCV: 79.2 fL (ref 78.0–100.0)
Platelets: 209 10*3/uL (ref 150–400)
RBC: 3.86 MIL/uL — ABNORMAL LOW (ref 3.87–5.11)
RDW: 21.5 % — ABNORMAL HIGH (ref 11.5–15.5)
WBC: 7 10*3/uL (ref 4.0–10.5)

## 2011-03-09 LAB — URINALYSIS, ROUTINE W REFLEX MICROSCOPIC
Glucose, UA: NEGATIVE mg/dL
pH: 5.5 (ref 5.0–8.0)

## 2011-03-09 LAB — BLOOD GAS, ARTERIAL
Acid-base deficit: 5 mmol/L — ABNORMAL HIGH (ref 0.0–2.0)
Bicarbonate: 20.4 mEq/L (ref 20.0–24.0)
O2 Content: 4 L/min
O2 Saturation: 99 %
pCO2 arterial: 39.3 mmHg (ref 35.0–45.0)
pH, Arterial: 7.341 — ABNORMAL LOW (ref 7.350–7.400)

## 2011-03-09 LAB — DIFFERENTIAL
Basophils Relative: 1 % (ref 0–1)
Eosinophils Absolute: 0.1 10*3/uL (ref 0.0–0.7)
Lymphocytes Relative: 29 % (ref 12–46)
Lymphs Abs: 2.6 10*3/uL (ref 0.7–4.0)
Monocytes Absolute: 0.4 10*3/uL (ref 0.1–1.0)
Monocytes Relative: 5 % (ref 3–12)
Monocytes Relative: 7 % (ref 3–12)
Neutro Abs: 5.5 10*3/uL (ref 1.7–7.7)
Neutrophils Relative %: 57 % (ref 43–77)
Neutrophils Relative %: 63 % (ref 43–77)

## 2011-03-09 LAB — TROPONIN I: Troponin I: 0.03 ng/mL (ref 0.00–0.06)

## 2011-03-09 LAB — CARDIAC PANEL(CRET KIN+CKTOT+MB+TROPI)
CK, MB: 1.3 ng/mL (ref 0.3–4.0)
Relative Index: INVALID (ref 0.0–2.5)
Relative Index: INVALID (ref 0.0–2.5)
Total CK: 92 U/L (ref 7–177)

## 2011-03-09 LAB — CK TOTAL AND CKMB (NOT AT ARMC)
CK, MB: 1 ng/mL (ref 0.3–4.0)
Relative Index: INVALID (ref 0.0–2.5)
Total CK: 66 U/L (ref 7–177)

## 2011-03-09 LAB — URINE CULTURE: Colony Count: NO GROWTH

## 2011-03-09 LAB — RAPID URINE DRUG SCREEN, HOSP PERFORMED
Amphetamines: NOT DETECTED
Barbiturates: NOT DETECTED

## 2011-03-09 LAB — ACETAMINOPHEN LEVEL
Acetaminophen (Tylenol), Serum: 17.7 ug/mL (ref 10–30)
Acetaminophen (Tylenol), Serum: <10 ug/mL — ABNORMAL LOW (ref 10–30)

## 2011-03-09 LAB — TSH: TSH: 0.428 u[IU]/mL (ref 0.350–4.500)

## 2011-03-10 LAB — URINE CULTURE
Colony Count: NO GROWTH
Culture: NO GROWTH

## 2011-03-10 LAB — HEPATIC FUNCTION PANEL
Albumin: 3.9 g/dL (ref 3.5–5.2)
Bilirubin, Direct: 0.1 mg/dL (ref 0.0–0.3)
Indirect Bilirubin: 0.2 mg/dL — ABNORMAL LOW (ref 0.3–0.9)
Total Bilirubin: 0.3 mg/dL (ref 0.3–1.2)

## 2011-03-10 LAB — URINALYSIS, ROUTINE W REFLEX MICROSCOPIC
Bilirubin Urine: NEGATIVE
Glucose, UA: NEGATIVE mg/dL
Glucose, UA: NEGATIVE mg/dL
Hgb urine dipstick: NEGATIVE
Ketones, ur: NEGATIVE mg/dL
Nitrite: NEGATIVE
Protein, ur: NEGATIVE mg/dL
Specific Gravity, Urine: 1.01 (ref 1.005–1.030)
pH: 5 (ref 5.0–8.0)

## 2011-03-10 LAB — DIFFERENTIAL
Basophils Absolute: 0 10*3/uL (ref 0.0–0.1)
Basophils Relative: 0 % (ref 0–1)
Eosinophils Absolute: 0.3 10*3/uL (ref 0.0–0.7)
Eosinophils Relative: 2 % (ref 0–5)
Monocytes Absolute: 0.7 10*3/uL (ref 0.1–1.0)
Monocytes Relative: 5 % (ref 3–12)
Neutro Abs: 7.1 10*3/uL (ref 1.7–7.7)
Neutrophils Relative %: 72 % (ref 43–77)
Neutrophils Relative %: 80 % — ABNORMAL HIGH (ref 43–77)

## 2011-03-10 LAB — BASIC METABOLIC PANEL
CO2: 26 mEq/L (ref 19–32)
Calcium: 9 mg/dL (ref 8.4–10.5)
Calcium: 9.3 mg/dL (ref 8.4–10.5)
Creatinine, Ser: 1.5 mg/dL — ABNORMAL HIGH (ref 0.4–1.2)
GFR calc Af Amer: 45 mL/min — ABNORMAL LOW (ref >60)
GFR calc Af Amer: >60 mL/min (ref >60)
GFR calc non Af Amer: 37 mL/min — ABNORMAL LOW (ref >60)
GFR calc non Af Amer: >60 mL/min (ref >60)
Sodium: 136 mEq/L (ref 135–145)

## 2011-03-10 LAB — CBC
MCHC: 33.7 g/dL (ref 30.0–36.0)
MCV: 73.7 fL — ABNORMAL LOW (ref 78.0–100.0)
Platelets: 325 10*3/uL (ref 150–400)
RBC: 4.2 MIL/uL (ref 3.87–5.11)
RDW: 27.1 % — ABNORMAL HIGH (ref 11.5–15.5)
WBC: 13.3 10*3/uL — ABNORMAL HIGH (ref 4.0–10.5)

## 2011-03-10 LAB — RAPID URINE DRUG SCREEN, HOSP PERFORMED
Barbiturates: NOT DETECTED
Benzodiazepines: POSITIVE — AB
Cocaine: NOT DETECTED

## 2011-03-10 LAB — PHOSPHORUS: Phosphorus: 5 mg/dL — ABNORMAL HIGH (ref 2.3–4.6)

## 2011-03-10 LAB — CARDIAC PANEL(CRET KIN+CKTOT+MB+TROPI)
Relative Index: INVALID (ref 0.0–2.5)
Total CK: 82 U/L (ref 7–177)
Troponin I: 0.02 ng/mL (ref 0.00–0.06)

## 2011-03-10 LAB — PROTIME-INR: Prothrombin Time: 13.4 seconds (ref 11.6–15.2)

## 2011-03-10 LAB — ETHANOL: Alcohol, Ethyl (B): <5 mg/dL (ref 0–10)

## 2011-03-10 LAB — PHENYTOIN LEVEL, TOTAL: Phenytoin Lvl: <2.5 ug/mL — ABNORMAL LOW (ref 10.0–20.0)

## 2011-03-11 LAB — COMPREHENSIVE METABOLIC PANEL
ALT: 159 U/L — ABNORMAL HIGH (ref 0–35)
ALT: 164 U/L — ABNORMAL HIGH (ref 0–35)
ALT: 52 U/L — ABNORMAL HIGH (ref 0–35)
AST: 151 U/L — ABNORMAL HIGH (ref 0–37)
AST: 247 U/L — ABNORMAL HIGH (ref 0–37)
Albumin: 3.1 g/dL — ABNORMAL LOW (ref 3.5–5.2)
Albumin: 3.1 g/dL — ABNORMAL LOW (ref 3.5–5.2)
Albumin: 3.2 g/dL — ABNORMAL LOW (ref 3.5–5.2)
Alkaline Phosphatase: 119 U/L — ABNORMAL HIGH (ref 39–117)
Alkaline Phosphatase: 135 U/L — ABNORMAL HIGH (ref 39–117)
Alkaline Phosphatase: 158 U/L — ABNORMAL HIGH (ref 39–117)
Alkaline Phosphatase: 212 U/L — ABNORMAL HIGH (ref 39–117)
BUN: 2 mg/dL — ABNORMAL LOW (ref 6–23)
BUN: 22 mg/dL (ref 6–23)
BUN: 28 mg/dL — ABNORMAL HIGH (ref 6–23)
BUN: 9 mg/dL (ref 6–23)
CO2: 22 mEq/L (ref 19–32)
CO2: 24 mEq/L (ref 19–32)
CO2: 29 mEq/L (ref 19–32)
Calcium: 8.3 mg/dL — ABNORMAL LOW (ref 8.4–10.5)
Chloride: 107 mEq/L (ref 96–112)
Chloride: 109 mEq/L (ref 96–112)
Chloride: 112 mEq/L (ref 96–112)
Creatinine, Ser: 2.43 mg/dL — ABNORMAL HIGH (ref 0.4–1.2)
GFR calc Af Amer: >60 mL/min (ref >60)
GFR calc Af Amer: >60 mL/min (ref >60)
GFR calc non Af Amer: 21 mL/min — ABNORMAL LOW (ref >60)
GFR calc non Af Amer: >60 mL/min (ref >60)
GFR calc non Af Amer: >60 mL/min (ref >60)
Glucose, Bld: 101 mg/dL — ABNORMAL HIGH (ref 70–99)
Glucose, Bld: 110 mg/dL — ABNORMAL HIGH (ref 70–99)
Glucose, Bld: 83 mg/dL (ref 70–99)
Potassium: 3.2 mEq/L — ABNORMAL LOW (ref 3.5–5.1)
Potassium: 3.5 mEq/L (ref 3.5–5.1)
Potassium: 3.9 mEq/L (ref 3.5–5.1)
Potassium: 4.2 mEq/L (ref 3.5–5.1)
Sodium: 138 mEq/L (ref 135–145)
Sodium: 138 mEq/L (ref 135–145)
Sodium: 140 mEq/L (ref 135–145)
Sodium: 141 mEq/L (ref 135–145)
Total Bilirubin: 0.2 mg/dL — ABNORMAL LOW (ref 0.3–1.2)
Total Bilirubin: 0.3 mg/dL (ref 0.3–1.2)
Total Bilirubin: 0.3 mg/dL (ref 0.3–1.2)
Total Bilirubin: 0.4 mg/dL (ref 0.3–1.2)
Total Protein: 5.6 g/dL — ABNORMAL LOW (ref 6.0–8.3)
Total Protein: 5.8 g/dL — ABNORMAL LOW (ref 6.0–8.3)
Total Protein: 6.7 g/dL (ref 6.0–8.3)

## 2011-03-11 LAB — DIFFERENTIAL
Basophils Absolute: 0 10*3/uL (ref 0.0–0.1)
Basophils Absolute: 0 10*3/uL (ref 0.0–0.1)
Basophils Absolute: 0 10*3/uL (ref 0.0–0.1)
Basophils Absolute: 0 10*3/uL (ref 0.0–0.1)
Basophils Absolute: 0 10*3/uL (ref 0.0–0.1)
Basophils Absolute: 0 10*3/uL (ref 0.0–0.1)
Basophils Absolute: 0 10*3/uL (ref 0.0–0.1)
Basophils Relative: 0 % (ref 0–1)
Basophils Relative: 0 % (ref 0–1)
Basophils Relative: 0 % (ref 0–1)
Basophils Relative: 1 % (ref 0–1)
Basophils Relative: 1 % (ref 0–1)
Eosinophils Absolute: 0.1 10*3/uL (ref 0.0–0.7)
Eosinophils Absolute: 0.1 10*3/uL (ref 0.0–0.7)
Eosinophils Absolute: 0.1 10*3/uL (ref 0.0–0.7)
Eosinophils Absolute: 0.1 10*3/uL (ref 0.0–0.7)
Eosinophils Absolute: 0.2 10*3/uL (ref 0.0–0.7)
Eosinophils Relative: 0 % (ref 0–5)
Eosinophils Relative: 1 % (ref 0–5)
Eosinophils Relative: 1 % (ref 0–5)
Eosinophils Relative: 1 % (ref 0–5)
Eosinophils Relative: 2 % (ref 0–5)
Eosinophils Relative: 2 % (ref 0–5)
Lymphocytes Relative: 10 % — ABNORMAL LOW (ref 12–46)
Lymphocytes Relative: 23 % (ref 12–46)
Lymphocytes Relative: 28 % (ref 12–46)
Lymphs Abs: 2.2 10*3/uL (ref 0.7–4.0)
Lymphs Abs: 2.3 10*3/uL (ref 0.7–4.0)
Lymphs Abs: 2.7 10*3/uL (ref 0.7–4.0)
Monocytes Absolute: 0.5 10*3/uL (ref 0.1–1.0)
Monocytes Absolute: 0.6 10*3/uL (ref 0.1–1.0)
Monocytes Absolute: 0.6 10*3/uL (ref 0.1–1.0)
Monocytes Absolute: 0.7 10*3/uL (ref 0.1–1.0)
Monocytes Absolute: 0.8 10*3/uL (ref 0.1–1.0)
Monocytes Relative: 6 % (ref 3–12)
Monocytes Relative: 8 % (ref 3–12)
Monocytes Relative: 8 % (ref 3–12)
Neutro Abs: 18 10*3/uL — ABNORMAL HIGH (ref 1.7–7.7)
Neutro Abs: 4.4 10*3/uL (ref 1.7–7.7)
Neutro Abs: 5.4 10*3/uL (ref 1.7–7.7)
Neutro Abs: 6.1 10*3/uL (ref 1.7–7.7)
Neutro Abs: 7.8 10*3/uL — ABNORMAL HIGH (ref 1.7–7.7)
Neutrophils Relative %: 57 % (ref 43–77)
Neutrophils Relative %: 60 % (ref 43–77)
Neutrophils Relative %: 67 % (ref 43–77)
Neutrophils Relative %: 73 % (ref 43–77)

## 2011-03-11 LAB — BASIC METABOLIC PANEL
BUN: 1 mg/dL — ABNORMAL LOW (ref 6–23)
BUN: 4 mg/dL — ABNORMAL LOW (ref 6–23)
BUN: 6 mg/dL (ref 6–23)
CO2: 25 mEq/L (ref 19–32)
Calcium: 9 mg/dL (ref 8.4–10.5)
Chloride: 107 mEq/L (ref 96–112)
Chloride: 110 mEq/L (ref 96–112)
Creatinine, Ser: 0.56 mg/dL (ref 0.4–1.2)
Creatinine, Ser: 0.66 mg/dL (ref 0.4–1.2)
GFR calc Af Amer: >60 mL/min (ref >60)
GFR calc non Af Amer: >60 mL/min (ref >60)
GFR calc non Af Amer: >60 mL/min (ref >60)
Glucose, Bld: 90 mg/dL (ref 70–99)
Glucose, Bld: 92 mg/dL (ref 70–99)
Potassium: 2.8 mEq/L — ABNORMAL LOW (ref 3.5–5.1)
Potassium: 3.2 mEq/L — ABNORMAL LOW (ref 3.5–5.1)
Sodium: 141 mEq/L (ref 135–145)

## 2011-03-11 LAB — CLOSTRIDIUM DIFFICILE EIA: C difficile Toxins A+B, EIA: NEGATIVE

## 2011-03-11 LAB — STOOL CULTURE

## 2011-03-11 LAB — OVA AND PARASITE EXAMINATION: Ova and parasites: NONE SEEN

## 2011-03-11 LAB — CBC
HCT: 22.7 % — ABNORMAL LOW (ref 36.0–46.0)
HCT: 24.2 % — ABNORMAL LOW (ref 36.0–46.0)
HCT: 25.2 % — ABNORMAL LOW (ref 36.0–46.0)
HCT: 27.9 % — ABNORMAL LOW (ref 36.0–46.0)
HCT: 29.2 % — ABNORMAL LOW (ref 36.0–46.0)
HCT: 29.5 % — ABNORMAL LOW (ref 36.0–46.0)
Hemoglobin: 7.9 g/dL — CL (ref 12.0–15.0)
Hemoglobin: 8.1 g/dL — ABNORMAL LOW (ref 12.0–15.0)
Hemoglobin: 9.1 g/dL — ABNORMAL LOW (ref 12.0–15.0)
Hemoglobin: 9.4 g/dL — ABNORMAL LOW (ref 12.0–15.0)
Hemoglobin: 9.5 g/dL — ABNORMAL LOW (ref 12.0–15.0)
MCHC: 32.1 g/dL (ref 30.0–36.0)
MCHC: 32.7 g/dL (ref 30.0–36.0)
MCHC: 33 g/dL (ref 30.0–36.0)
MCV: 73 fL — ABNORMAL LOW (ref 78.0–100.0)
MCV: 73.6 fL — ABNORMAL LOW (ref 78.0–100.0)
MCV: 74.4 fL — ABNORMAL LOW (ref 78.0–100.0)
Platelets: 177 10*3/uL (ref 150–400)
Platelets: 182 10*3/uL (ref 150–400)
Platelets: 198 10*3/uL (ref 150–400)
Platelets: 208 10*3/uL (ref 150–400)
Platelets: 208 10*3/uL (ref 150–400)
Platelets: 216 10*3/uL (ref 150–400)
Platelets: 229 10*3/uL (ref 150–400)
RBC: 3.33 MIL/uL — ABNORMAL LOW (ref 3.87–5.11)
RBC: 3.33 MIL/uL — ABNORMAL LOW (ref 3.87–5.11)
RBC: 3.5 MIL/uL — ABNORMAL LOW (ref 3.87–5.11)
RBC: 3.79 MIL/uL — ABNORMAL LOW (ref 3.87–5.11)
RBC: 4.01 MIL/uL (ref 3.87–5.11)
RDW: 25.4 % — ABNORMAL HIGH (ref 11.5–15.5)
RDW: 25.9 % — ABNORMAL HIGH (ref 11.5–15.5)
RDW: 26.1 % — ABNORMAL HIGH (ref 11.5–15.5)
RDW: 27 % — ABNORMAL HIGH (ref 11.5–15.5)
RDW: 27.2 % — ABNORMAL HIGH (ref 11.5–15.5)
RDW: 28.1 % — ABNORMAL HIGH (ref 11.5–15.5)
WBC: 21.5 10*3/uL — ABNORMAL HIGH (ref 4.0–10.5)
WBC: 7.8 10*3/uL (ref 4.0–10.5)
WBC: 7.9 10*3/uL (ref 4.0–10.5)
WBC: 9.3 10*3/uL (ref 4.0–10.5)

## 2011-03-11 LAB — CROSSMATCH

## 2011-03-11 LAB — RAPID URINE DRUG SCREEN, HOSP PERFORMED
Amphetamines: NOT DETECTED
Benzodiazepines: POSITIVE — AB
Cocaine: NOT DETECTED
Opiates: POSITIVE — AB
Tetrahydrocannabinol: NOT DETECTED

## 2011-03-11 LAB — URINALYSIS, ROUTINE W REFLEX MICROSCOPIC
Glucose, UA: NEGATIVE mg/dL
Ketones, ur: NEGATIVE mg/dL
Urobilinogen, UA: 0.2 mg/dL (ref 0.0–1.0)

## 2011-03-11 LAB — PHENYTOIN LEVEL, TOTAL: Phenytoin Lvl: <2.5 ug/mL — ABNORMAL LOW (ref 10.0–20.0)

## 2011-03-11 LAB — METHYLMALONIC ACID, SERUM: Methylmalonic Acid, Quantitative: 109 nmol/L (ref 87–318)

## 2011-03-11 LAB — URINE MICROSCOPIC-ADD ON

## 2011-03-11 LAB — HEMOGLOBIN AND HEMATOCRIT, BLOOD
HCT: 26.4 % — ABNORMAL LOW (ref 36.0–46.0)
Hemoglobin: 8.6 g/dL — ABNORMAL LOW (ref 12.0–15.0)

## 2011-03-11 LAB — LIPASE, BLOOD: Lipase: 40 U/L (ref 11–59)

## 2011-03-11 LAB — PHOSPHORUS: Phosphorus: 4.9 mg/dL — ABNORMAL HIGH (ref 2.3–4.6)

## 2011-03-11 LAB — VITAMIN B12: Vitamin B-12: 402 pg/mL (ref 211–911)

## 2011-03-12 LAB — BASIC METABOLIC PANEL
BUN: 9 mg/dL (ref 6–23)
CO2: 25 mEq/L (ref 19–32)
Chloride: 87 mEq/L — ABNORMAL LOW (ref 96–112)
Creatinine, Ser: 0.61 mg/dL (ref 0.4–1.2)
GFR calc Af Amer: >60 mL/min (ref >60)
GFR calc non Af Amer: >60 mL/min (ref >60)
Glucose, Bld: 80 mg/dL (ref 70–99)
Potassium: 3.6 mEq/L (ref 3.5–5.1)
Sodium: 126 mEq/L — ABNORMAL LOW (ref 135–145)

## 2011-03-12 LAB — HEMOGLOBIN AND HEMATOCRIT, BLOOD
HCT: 25.1 % — ABNORMAL LOW (ref 36.0–46.0)
Hemoglobin: 8 g/dL — ABNORMAL LOW (ref 12.0–15.0)

## 2011-03-30 ENCOUNTER — Emergency Department (HOSPITAL_COMMUNITY)
Admission: EM | Admit: 2011-03-30 | Discharge: 2011-03-30 | Discharge status: Against Medical Advice | Payer: Self-pay | Attending: Emergency Medicine | Admitting: Emergency Medicine

## 2011-04-14 NOTE — Procedures (Signed)
NAME:  [name], [name]                ACCOUNT NO.:  [id number]   MEDICAL RECORD NO.:  [id number]          PATIENT TYPE:  INP   LOCATION:  A306                          FACILITY:  [facility]   PHYSICIAN:  Kofi A. Gerilyn Pilgrim, M.D. DATE OF BIRTH:  [birth year]   DATE OF PROCEDURE:  DATE OF DISCHARGE:  03/27/2009                              EEG INTERPRETATION   The patient is a 47-year lady who presents with altered mental status,  confusion with possible seizures, does have a history of seizures.   MEDICATIONS:  Xanax, Zestril, Lasix, Tenormin, Seroquel, Cymbalta,  Levaquin, Bentyl, Protonix, Phenergan, potassium, Dilaudid, Benadryl,  Darvocet, and Keppra.   ANALYSIS:  A 16-channel recording is conducted for approximately 20  minutes.  There is a well-formed posterior rhythm of 10 Hz with  attenuates with eye opening.  There is beta activity observed in the  frontal areas.  Awake and drowsy activities are observed.  Photic  stimulation is carried out without significant changes in the background  activity.  There is no focal or lateralized slowing.  There are few  sharp wave activity with phase reverses at T3.   IMPRESSION:  Mildly abnormal recording with a few epileptiform activity  involving in the left temporal area.  However, there is no evidence of  ongoing electrographic seizures.      Kofi A. Gerilyn Pilgrim, M.D.  Electronically Signed     KAD/MEDQ  D:  03/27/2009  T:  03/27/2009  Job:  [id number]

## 2011-04-14 NOTE — Procedures (Signed)
RENAL ARTERY DUPLEX EVALUATION   INDICATION:  Follow up abnormal MRA on 06/06/2010.   HISTORY:  Diabetes:  No.  Cardiac:  No.  Hypertension:  Yes.  Smoking:  No.   RENAL ARTERY DUPLEX FINDINGS:  Aorta-Proximal:  94 cm/s  Aorta-Mid:  85 cm/s  Aorta-Distal:  85 cm/s  Celiac Artery Origin:  85 cm/s  SMA Origin:  186 cm/s                                    RIGHT               LEFT  Renal Artery Origin:             Not visualized      196 cm/s  Renal Artery Proximal:           146 cm/s            168 cm/s  Renal Artery Mid:                160 cm/s            134 cm/s  Renal Artery Distal:             130 cm/s            72 cm/s  Hilar Acceleration Time (AT):  Renal-Aortic Ratio (RAR):        1.88                2.3  Kidney Size:                     8.52 cm             11.43 cm  End Diastolic Ratio (EDR):  Resistive Index (RI):            0.72                0.63   IMPRESSION:  1. No evidence of a hemodynamically significant stenosis of the      bilateral renal arteries, based on renal aortic ratio and decreased      visualization. Mildly increased velocities of >150 cm/s noted in      the bilateral renal arteries, as described above, with no plaque      formations being adequately visualized.  2. The bilateral intrarenal resistive indices and kidney length      measurements are within normal limits.  3. Unable to adequately visualize the right renal artery origin due to      overlying bowel gas patterns.   ___________________________________________  [name], M.D.   CH/MEDQ  D:  07/23/2010  T:  07/23/2010  Job:  [id number]

## 2011-04-14 NOTE — Assessment & Plan Note (Signed)
NAMEMarland Kitchen  Danielle, [name]                 CHART#:  [id number]   DATE:  11/29/2007                       DOB:  18-Jun-1961   CHIEF COMPLAINT:  Abdominal pain, nausea and vomiting.   SUBJECTIVE:  [name] is here for an urgent visit today. Dr. Jena Gauss  requested that we see her today as she was seen in the Emergency  Department twice yesterday with complaints of migraine headaches and  abdominal pain. She also reports nausea and vomiting. She is well known  to Dr. Cira Servant. The last time she was seen, however, was in September  2007. She has either rescheduled or no showed on multiple visits since  that time. We most recently had contact with the patient when she called  in November 10 complaining of abdominal pain. She rescheduled a November  11 and no showed for November 13 appointment. She states she has had  intermittent abdominal pain and vomiting for a couple of months now. She  complains of a constant epigastric pain. She states she has intermittent  pain in the left upper quadrant region underneath her ribs.  Seems to be  worse postprandially. She states she has been avoiding foods. She states  she is vomiting green slimy stuff for the past 3-4 days. She also  complains of passing mucusy stools 1 or 2 times a week. Denies any blood  in the stools or emesis. She denies any dysuria. She does have some back  pain in the midback. Denies any fever or chills. During the course of  our conversations, she talked about how persistent her vomiting has  been. She states she lost about 25 pounds over the past several months.  She then, upon exiting the room, begins to enquire about having lap band  or gastric bypass surgery done for weight loss purposes. She also has  been having migraine headaches which she has had for 17 years. She  states they seem to be triggered by stress and she admits to having a  lot of stress lately. She usually has vomiting associated with her  migraines.   Recently she had a  CT of the head on December 5 which was stable with  small bilateral dilated perivascular stasis. She has had no recent  imaging of her abdomen. She is being treated for a UTI with greater than  100,000 colony forming units of gram negative rods detected the last 2  days. She started Cipro yesterday. No labs done the last 2 weeks.  Notably back in September 2008, her alk phos was up at 143.  In July  2008, her alk phos and transaminases were mildly elevated.   CURRENT MEDICATIONS:  Dilantin 100 mg t.i.d., Cymbalta 60 mg daily,  Xanax 1 mg t.i.d. p.r.n., Lasix 40 mg daily, Pepcid AC 20 mg t.i.d.,  Phenergan 25 mg q. 6 hours p.r.n., lisinopril 10 mg daily, atenolol 25  mg daily, Cipro b.i.d.   ALLERGIES:  Multiple. She lists penicillin, tetracycline, erythromycin,  Keflex, Compazine, Toradol, tramadol, Imitrex, Zofran, Reglan, Haldol  and Tace.   PHYSICAL EXAMINATION:  VITAL SIGNS: Weight 194, height 5 feet, 4 inches,  temperature 98.2, blood pressure 112/70, pulse 68. Her last documented  weight in our office from June 25, 2006 at which time she weight 212  pounds.  GENERAL: Pleasant, obese, Caucasian female who  appears in mild distress.  I can hear her moaning and heaving from outside the exam room.  Throughout a 20 minute encounter time, however, she did not heave any  and appeared quite comfortable.  SKIN: Warm and dry. No jaundice.  HEENT: Sclerae nonicteric.  OROPHARYNX: Mucosa moist and pink.  CHEST: Lungs clear to auscultation.  CARDIAC: Reveals regular rate and rhythm.  ABDOMEN: Obese. Positive bowel sounds. Soft. Mild epigastric tenderness  to deep palpation. No rebound tenderness or guarding. No abdominal  bruits or hernias.  EXTREMITIES: Lower extremities no edema.   IMPRESSION:  [name] is a 50 year old lady who presents for an urgent  visit for abdominal pain, vomiting. Clinically she does not appear to be  in any distress at this time. Given that there has not been any  recent  lab or radiological workup for abdominal pain, I feel that we do have to  pursue this. It is notable that she has had intermittent elevated  transaminases as well as alkaline phosphatase dating back to 2007.  Therefore we will repeat these. She has a history of pancreatitis in the  past. We will repeat her amylase and lipase as well. Unfortunately, this  patient has a reputation of multiple ED visits as well as reputation for  drug seeking. I have discussed with her today that we will treat her  abdominal pain with appropriate therapy. However, she needs to seek  treatment for her migraine headache from her [facility]  [facility]. After outlining our course  of action with the patient, she asks that we postpone workup until next  week, because she has something she had to get done today. I encouraged  her to proceed to the lab for blood work analysis and schedule CT of her  abdomen and pelvis as planned. She agreed with the plan and stated that  she would comply.   PLAN:  1. CBC, CMET, amylase and lipase.  2. CT of the abdomen and pelvis without oral contrast.  3. Bentyl 10 mg p.o. q.i.d. p.r.n. abdominal pain #60, zero refills.  4. Nexium 40 mg daily, #10 samples provided.  5. Further recommendations to follow.       Tana Coast, P.A.  Electronically Signed     R. Roetta Sessions, M.D.  Electronically Signed   LL/MEDQ  D:  11/29/2007  T:  11/29/2007  Job:  [id number]   cc:   [facility]

## 2011-04-14 NOTE — H&P (Signed)
NAMEMERRICK, [name]:  [id number]   MEDICAL RECORD NO.:  [id number]          PATIENT TYPE:  INP   LOCATION:  A337                          FACILITY:  [facility]   PHYSICIAN:  Dorris Singh, DO    DATE OF BIRTH:  [birth year]   DATE OF ADMISSION:  04/02/2009  DATE OF DISCHARGE:  LH                              HISTORY & PHYSICAL   ADMISSION DIAGNOSIS:  Altered mental status.   The patient is a 47-year female who presented to the [facility]  Emergency Room after being discharged on April 28 for altered mental  status where she received an EEG and was seen by neurology and it was a  slightly mildly abnormal recording.  She returned home and stated that  her family felt like she had a seizure, became confused, and they called  EMS.  She was also very upset that she could not find her medications  and she has had two of these episodes since being discharged in the  hospital.  Also there was some confusion that the patient per EMS was  injecting her on own medications at the PICC line.  Will have Dr.  Mariel Sleet see her regarding her IV iron and PICC line situation while  she is here as well.   PAST MEDICAL HISTORY:  Significant for hypertension, anemia, Crohn's  disease, depression, migraines, and bipolar.   PAST SURGICAL HISTORY:  Appendectomy, hysterectomy, PICC line placement  for IV iron, and laparoscopic cholecystectomy.   SOCIAL HISTORY:  The patient is a nonsmoker, nondrinker.  No drug abuse.   ALLERGIES:  She is allergic to several medications that include  penicillin, Zofran, tetracycline, Keflex, Reglan, Compazine, Toradol,  tramadol, and erythromycin.   CURRENT MEDICATIONS:  1. Lisinopril 20 mg once a day.  2. Alprazolam 1 mg four times a day.  3. Ambien 10 mg at bedtime.  4. Potassium chloride 10 mEq once a day.  5. Phenytoin 100 mg three tablets three times a day.  6. Furosemide 40 mg daily.  7. Bentyl 10 mg p.o. q.4 h.  8. Atenolol 25 mg p.o.  daily.  9. Seroquel 400 mg p.o. q.h.s.  10.Cymbalta 60 mg p.o. daily.  11.Aleve as needed.  12.Bactrim 1 tablet twice a day which was filled in April which she is      not on any more.  13.Amitiza 8 mg twice a day.   REVIEW OF SYSTEMS:  Pertinent positives:  CONSTITUTIONAL:  Negative.  HEENT: Negative.  CARDIOVASCULAR:  Negative.  RESPIRATORY:  Negative.  GASTROINTESTINAL:  Negative.  GU: Negative.  MUSCULOSKELETAL:  Negative.  PSYCHIATRIC:  Positive for altered mental status and confusion.   PHYSICAL EXAMINATION:  Blood pressure 118/71, pulse rate 80,  respirations 28, O2 sats 97%.  GENERAL:  The patient is a 47-year female who is well-developed, well-  nourished, and  in no acute distress.  She is able to answer questions  appropriately.  HEENT:  Head is normocephalic, atraumatic.  Eyes are PERLA.  There is no  scleral icterus or conjunctival injection.  Ears, nose, mouth, and  throat are  within normal limits.  NECK:  Supple.  No lymphadenopathy.  HEART:  Regular rate and rhythm.  No murmurs, rubs or gallops.  CHEST:  Clear to auscultation bilaterally.  No rales, wheeze or rhonchi.  ABDOMEN:  Soft and nontender.  Bowel sounds are present.  EXTREMITIES:  Positive pulses.  No ecchymosis or cyanosis.  NEUROLOGIC:  Cranial nerves are grossly intact.  She is alert and  oriented x3.  SKIN:  Good turgor, good texture.   LABORATORY DATA:  Her current labs are as follows:  EKG is normal.  Urine is within normal limits.  CT of the head:  No evidence of acute  intracranial abnormality.  Sodium is 131, potassium is 5.1, chloride is  102, carbon dioxide 20, glucose 91, BUN 16.  Alcohol level less than 5.  Liver enzymes:  Alkaline phosphatase is 137 which is the pertinent high:  White count 13.3, hemoglobin 10.3, hematocrit 31.1, platelet count of  340 and she is positive for opiates, benzodiazepines, and amphetamines.   ASSESSMENT/PLAN:  1. Altered mental status which is resolved.  2.  Hyponatremia.  3. Leukocytosis.   Plan will be to admit her for observation to telemetry to 3A, do q.2  neuro checks, and keep her sats above 92%.  We will put her on half  normal saline at a rate of 100 mL an hour.  Will repeat an EEG due to  that this could be seizure activity.  Will consult Dr. Gerilyn Pilgrim again to  see her in the morning.  We will place her on DVT and GI prophylaxis.  Will keep the patient off of all sedative  medications due to her  previous history on the last admission where she became unresponsive  after taking several medications.  I have expressed this to the patient  in detail.  She has stated understanding.  Also, we will continue to  monitor her white count and her low sodium level.      Dorris Singh, DO  Electronically Signed     CB/MEDQ  D:  04/02/2009  T:  04/02/2009  Job:  [id number]

## 2011-04-14 NOTE — Letter (Signed)
January 19, 2008    [facility]  [address]  [place], [place]  [address]   Dear Sir/Madam:   I am Dr. Kassie Mends, a Gastroenterologist in [place], [place]. I initially saw [name] in July 2007 for vomiting. She had  a subsequent workup, which included an upper endoscopy, which showed  normal Billroth II anatomy and a colonoscopy, which showed hemorrhoids  in 2007. She had a gastric emptying study, which revealed delayed  gastric emptying, which is not unusual in a patient who had partial  gastrectomy. I saw [name] in October of 2007 and she was not seen  again in our office until December of 2008. She would periodically call  and get Phenergan because of periodic vomiting, which is likely  secondary to her gastroparesis. She reports having a dystonic reaction  to Reglan and reports being allergic to Zofran and Erythromycin and can  only use Phenergan to her vomiting. She was asked to follow a  gastroparesis diet. When I first met [name], she was 212 pounds and  now, she is 187 pounds. She has been a no show for multiple appointments  in 2007 and in 2008. She requested that I write you a letter in regards  to her gastrointestinal issues.   Today, she complains of throwing up and hurting in her right and left  upper abdomen. I believe that [name] suffers from chronic abdominal  pain due to no acute intra-abdominal pathology. 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 She has  also intermittently had elevated liver enzymes since 2006. 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 She has had  negative serologies for hepatitis A, B, and C. She does not have any  evidence of fatigue, itching, yellow eyes, black  stool, or blood in her  stool. She reports throwing up about every day. She said she is  pretty much following her gastroparesis diet.  PHYSICAL EXAMINATION:  VITAL SIGNS:  Weight is 187 pounds, height 5 foot  4 1/2 inches. Body mass index is 32.1 (obese.) Temperature 97.9. Blood  pressure 110/82, pulse 64. GENERAL:  No apparent distress.LUNGS:  Clear  to auscultation bilaterally.  CARDIOVASCULAR:  Regular rhythm. No murmur. Normal S1 and S2.ABDOMEN:  Bowel sounds are present, soft, reproducible abdominal pain with  straight leg raises off the examination table, consistent with Carnet  sign and abdominal wall pain. She has no rebound or guarding. Her  abdomen is obese. EXTREMITIES:  No cyanosis or edema.  NEUROLOGIC:  She does not appear to have any focal neurological  deficits.   [name] is a 50 year old lady with gastroparesis and has periodic  vomiting. She has, what I believe, is abdominal wall pain. 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 The differential diagnoses for her elevated liver  enzymes includes primary biliary cirrhosis and a low likelihood of  autoimmune hepatitis. She will have labs that are pending to include an  GGT, an ANA, an anti-smooth muscle antibody, quantitative immune  globulin and anti-mitochondrial antibody. She is given prescription for  Phenergan to use as needed for vomiting. She is also given Bentyl to  assist with any component of functional gut disturbance. She has  a  return patient visit to see me in 4 months. I believe if Mr. [name]  remained on the proper therapy for her gastroparesis and followed her  diet, that the gastroparesis is not disabling. I believe the majority of  [name]' gastrointestinal difficulties result from the physical  manifestation of psychological stress.   Please feel free to contact me at [phone], if you have additional  questions.      Kassie Mends, M.D.   Electronically Signed     SM/MEDQ  D:  01/19/2008  T:  01/20/2008  Job:  [id number]
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
## 2011-04-14 NOTE — Consult Note (Signed)
NAME:  [name], [name]                ACCOUNT NO.:  [id number]   MEDICAL RECORD NO.:  [id number]          PATIENT TYPE:  INP   LOCATION:  A306                          FACILITY:  [facility]   PHYSICIAN:  Kofi A. Gerilyn Pilgrim, M.D. DATE OF BIRTH:  1961-04-16   DATE OF CONSULTATION:  DATE OF DISCHARGE:                                 CONSULTATION   REASON FOR CONSULTATION:  Altered mental status.   The patient is a 50 year old white female who has a long history of  abdominal and GI problems.  She is well known to Dr. Kassie Mends for  chronic abdominal pain.  She presented to the hospital for abdominal  pain.  It appears that she has had some problems with altered mentation  and hence the neurologic consultation.  Dr. Luvenia Starch note indicated that  she has problems with complete thought process.  Was not able to tell if  she has a PICC line in her arm or not.  Nurse reported that she has had  some problems with repeating things and short-term memory impairment.  The patient herself also reports today that she has had some problems  with short-term memory impairment.  It appears that the patient has had  a very extensive workup and evaluation over the years for chronic  abdominal pain which is elicited in Dr. Luvenia Starch notes.  The patient has  a history of seizure disorder.  She reports that her seizures have been  typical grand mal seizures with occasional having urinary incontinence  and sometimes biting her tongue.  She may also have a staring spell, but  the history was not crisp regarding this.  She cannot state exactly how  frequently she has seizures, and she really could not tell me what types  of medicine she takes for seizures.  She does take a lot of psychiatric  medications for depression which she sees Dr. Betti Cruz for.   PAST MEDICAL HISTORY:  1. Chronic abdominal pain thought to be significant functional      component.  2. Hypertension.  3. Depression.  4. Migraine headaches.  5.  Seizure disorder.  6. Peptic ulcer disease.  7. Iron deficiency anemia.  She sees Dr. Mariel Sleet.  8. Chronic low back pain.  9. History of prior polysubstance abuse.  No abuse recently.      Apparently consumed alcohol heavily in the past.   PAST SURGICAL HISTORY:  1. Recent EGD, colonoscopy.  2. She had a Billroth II procedure for peptic ulcer disease.  3. Appendectomy.  4. Cholecystectomy.  5. Tubal ligation.  6. PICC line placement.  7. Hysterectomy.   FAMILY HISTORY:  Negative for colorectal carcinoma.   ADMISSION MEDICATIONS:  1. Phenytoin 300 mg at bedtime.  2. Atenolol 25 mg daily.  3. Seroquel 400 mg q.h.s.  4. Ambien 10 mg at bedtime.  5. Amitiza 8 mcg b.i.d.  6. Lasix 40 mg.  7. Potassium chloride 10 mEq daily.  8. Cymbalta 60 mg daily.  9. Lisinopril 20 mg daily.  10.Sulfamethoxazole b.i.d. for 7 days.   ALLERGIES:  PENICILLIN, REGLAN, HALDOL, ZOFRAN, IMIPRAMINE,  TETRACYCLINE, ERYTHROMYCIN, KEFLEX, COMPAZINE, TORADOL, TRAMADOL.   SOCIAL HISTORY:  She is divorced.  She lives with her boyfriend.  Daughter lives in [place].  Son committed suicide 3 years ago.  The  patient suffers from depression.   REVIEW OF SYSTEMS:  As stated in the history of present illness.   PHYSICAL EXAMINATION:  Examination shows an obese pleasant lady in no  acute distress.  Temperature 98.6, pulse 110, respirations 18, blood pressure 159/92.  NECK:  Supple.  HEENT:  Head is normocephalic, atraumatic.  ABDOMEN:  Obese, soft.  EXTREMITIES:  No significant edema.  NEUROLOGIC:  Mentation:  The patient is currently awake and alert.  She  converses fairly well.  She has a little bit of difficulties with giving  a crisp history but indicated some likely short-term memory impairment.  No aphasia is observed.  No dysarthria.  She follows commands well.  She  is oriented to person, place, year and medical situation.  Cranial nerve  evaluation:  Pupils equal, round, reactive to light and  accommodation.  Visual fields are intact.  Extraocular movements are intact.  Facial  muscle strength is symmetric.  Tongue midline.  Uvula midline.  Motor  examination shows normal tone, bulk and strength.  There is no pronator  drift.  Coordination is essentially intact.  There is no parkinsonism,  no dysmetria or tremors.  Reflexes are normal throughout, although she  does have upgoing toes on the right and equivocal on the left.  Sensation normal to light touch.   LABORATORY EVALUATION:  She has had some increasing liver enzymes,  alkaline phosphatase 158 increased to 212, AFB 202 now increased to 209,  ALT 52 now increased to 138.  Today's basic chemistries showed a sodium  of 140, potassium 2.8, chloride 107, CO2 27, BUN 1, creatinine 0.3,  glucose 95.  Urinalysis showed moderate blood, leukocytes negative,  nitrites negative.  A CT scan is reviewed in person and shows frontal  hypodensity especially along the left frontal area.  Nothing acute is  seen, however.  Additional labs:  WBC is up to 21,500,  hemoglobin 9.   ASSESSMENT:  1. Mild encephalopathy of unclear etiology.  There is discussed to be      a low-grade mild cognitive impairment at baseline.  The recent      encephalopathy could be due to metabolic processes including      hepatic encephalopathy, acute medical illness.  The baseline mild      cognitive impairment may be due to ischemic white matter changes      especially involving the left frontal area which may cause some      problems with memory or language impairment.  2. Seizure disorder.  3. Hypertension which increases the risk of white matter lesions.  4. Status post abdominal procedure, Billroth II, which increases the      risk of nutritional deficiency.   RECOMMENDATIONS:  1. Blood testing for ammonia level, phenytoin level, vitamin B12      level, homocysteine level and methylmalonic acid level.  2. MRI of the brain.  3. EEG.      Kofi A.  Gerilyn Pilgrim, M.D.  Electronically Signed     KAD/MEDQ  D:  03/25/2009  T:  03/25/2009  Job:  [id number]

## 2011-04-14 NOTE — H&P (Signed)
NAME:  [name], [name]:  [id number]   MEDICAL RECORD NO.:  [id number]          PATIENT TYPE:  INP   LOCATION:  A316                          FACILITY:  [facility]   PHYSICIAN:  Dorris Singh, DO    DATE OF BIRTH:  Jan 05, 1961   DATE OF ADMISSION:  03/20/2009  DATE OF DISCHARGE:  LH                              HISTORY & PHYSICAL   PRIMARY CARE PHYSICIAN:  The patient has no PCP.   CHIEF COMPLAINT:  Abdominal pain.   HISTORY OF THE PRESENT ILLNESS:  The patient is a 50 year old female who  presented to the [facility] emergency room with the above complaint.  She states that she had been having problems for some time and it took  it to prostrate about 2 hours ago.  She tried to have a bowel movement;  however, she began to have severe cramping and she vomited.  The emesis  was green and it was liquidly.  She states that she has a history of  being anemic and she has been getting PICC line iron infusion.  She  states that her pain at the max was 10/10 and located in midabdominal  area.  Also about 2 hours ago she had a bologna burger and the pain  started; it is persistent and has not gone away.   PAST MEDICAL HISTORY:  The patient's past medical history is remarkable  for:  1. Hypertension.  2. Anemia.  3. Crohn's disease.  4. Depression.  5. Migraines.   PAST SURGICAL HISTORY:  1. Appendectomy.  2. Cholecystectomy.  3. Hysterectomy.  4. Also a PICC line placement.  5. Laparoscopic cholecystectomy.   SOCIAL HISTORY:  The patient is a nonsmoker and a nondrinker.  No drug  abuse.   ALLERGIES:  THE PATIENT IS ALLERGIC TO PENICILLIN.   MEDICATIONS:  The patient's list of medications include:  1. Lisinopril 20 mg once a day.  2. Alprazolam 1 mg four times a day.  3. Zolpidem 10 mg at bedtime.  4. Potassium chloride 10 mEq once a day.  5. Phenytoin 100 mg times 3 tablets at bedtime.  6. __________ 40 mg once a day.  7. Bentyl 10 mg every 4 hours.  8.  Atenolol 25 mg once a day.  9. Aleve as needed.  10.Seroquel 400 mg at bedtime.  11.Amitiza 8 mg twice a day.  12.Cymbalta 60 mg once a day.  13.Bactrim 1 tablet twice a day; this was just filled on March 19, 2009.   REVIEW OF SYSTEMS:  The patient's review of systems is generally  negative for changes; however, positive for changes in appetite.  Negative for fever or chills.  HEENT:  Eyes are negative for eye pain or  changes in vision.  Ears, nose, mouth and throat are negative for  changes in hearing, smell and taste.  CARDIOVASCULAR:  Negative for  chest pain or palpitations.  RESPIRATORY:  Negative for shortness of  breath and wheezing.  GASTROINTESTINAL:  Positive for nausea, vomiting  and abdominal pain; and, negative for diarrhea.  GENITOURINARY:  Negative for dysuria and flank pain.  MUSCULOSKELETAL:  Negative for  arthralgias, back pain and neck pain.  INTEGUMENT:  Skin is negative for  rash.  NEUROLOGICAL:  Positive for headache.  PSYCHIATRIC:  Positive for  depression.  HEMATOLOGIC:  Negative for anemia.   PHYSICAL EXAMINATION:  VITAL SIGNS:  The patient's current vital signs  reveal a blood pressure of 101/80, pulse rate 98, respirations 24 and  temperature 98.8.  GENERAL APPEARANCE:  Generally the patient is a 50 year old female who  is well-developed, well-nourished in no acute distress.  HEENT:  Head is normocephalic and atraumatic.  Eyes are PERRLA.  EOMI.  No scleral icterus or conjunctival injection.  Ears, nose, mouth and  throat are all within normal limits.  NECK:  The neck is supple.  No lymphadenopathy.  The neck has full range  of motion.  HEART:  Cardiovascular is regular rate and rhythm.  No murmurs, rubs or  gallops noted.  CHEST:  The chest is clear to auscultation bilaterally.  No rales,  wheezes or rhonchi.  ABDOMEN:  The abdomen is soft and nontender.  Bowel sounds are present.  EXTREMITIES:  The extremities have positive pulses.  No ecchymosis  or  cyanosis; however, with positive trace to +1 edema.  NEUROLOGIC EXAMINATION:  Neuro - cranial nerves II-XII are grossly  intact.  She is alert and oriented times three.  SKIN:  The skin has good turgor and good texture.  No breaks in the  skin.  No rashes noted.   LABORATORY DATA:  The patient's results were the following:  sodium 138,  potassium 4.2, chloride 102, carbon dioxide 29, glucose 101, BUN 9, and  creatinine 0.77.  Her lipase is 68.  White count of 11.4, hemoglobin  9.1, hematocrit 25.2 and platelet count 325,000.  An acute abdominal  series showed no acute abnormalities.   DIAGNOSES:  Abdominal pain of nausea and vomiting, and elevated lipase.   PLAN:  The plan will be to:  1. Admit the patient to the Service of [facility].  2. We will place her on a general med floor.  3. We will make her nothing per os.  4. We will do some pain management and also offer Fleet's enema if the      patient still feels constipated.  5. We will do DVT and GI prophylaxes.  6. We will place her on her home medications.  7. The patient will be nothing per os, except for meds; and, if need      be, we will consider a consult with gastroenterology.  8. We will continue to monitor her and change therapy as necessary.      Dorris Singh, DO  Electronically Signed     CB/MEDQ  D:  03/20/2009  T:  03/21/2009  Job:  [id number]

## 2011-04-14 NOTE — Discharge Summary (Signed)
NAME:  [name], [name]:  [id number]   MEDICAL RECORD NO.:  [id number]          PATIENT TYPE:  INP   LOCATION:  A306                          FACILITY:  [facility]   PHYSICIAN:  Dorris Singh, DO    DATE OF BIRTH:  04-Sep-1961   DATE OF ADMISSION:  03/20/2009  DATE OF DISCHARGE:  04/28/2010LH                               DISCHARGE SUMMARY   ADMISSION DIAGNOSES:  1. Abdominal pain.  2. Nausea, vomiting.  3. Elevated lipase.   DISCHARGE DIAGNOSES:  1. Seizures.  2. Altered mental status.  3. Gastroparesis, which causes her acute on chronic abdominal pain.  4. Polysubstance use of narcotics and antianxiolytics.   TESTING INCLUDES:  1. On April 21 an acute abdomen, which shows no acute abnormalities.  2. CT of the head on April 22, which shows no acute intracranial      abnormalities.  3. On April 22, ultrasound of the abdomen, which shows status post      cholecystectomy, no evidence of biliary dilation, incomplete      visualization of the pancreas, mildly coarse hepatic echogenicity      without focal mass lesion, nonspecific; minimal __________, right      kidney.  4. MRI of the brain on April 26 demonstrates abnormal usual pattern of      restricted diffusion as described above.  I believe findings are      most consistent with imaging of the brain and the intraictal or      postictal state.  See comments:  While not completely excluding the      imaging findings are not a characteristic of limbic encephalitis or      __________ encephalitis as described above.  5. The patient did have an EEG.   HOSPITAL COURSE:  The patient was originally admitted with abdominal  pain.  She was admitted with the above diagnoses.  While being  evaluated, patient had received a lot of pain medication.  This is when  I was following the patient and had to be given Narcan due to being  unresponsive.  She continued without being on any pain medications.  However, she was on  Phenergan.  Dr. Jena Gauss was consulted to see her due  to her nausea and vomiting.  She was found to have chronic gastroparesis  and needed to continue on her current medications as recommended.  Also  Dr. Gerilyn Pilgrim was consulted to see her and felt that her problems with  her altered mental status could be due to an increase in her seizure  activity.  She was then placed on IV Keppra and she did not get an EGD  done here due to some technical difficulties, so she will need to see  Dr. Gerilyn Pilgrim as an outpatient to have that done.  The patient was kept  on IV Keppra and on April 28 it was determined she could be sent home  and we will go ahead and change her to p.o. medications.  I spoke with  Dr. Gerilyn Pilgrim and GI regarding her plan of care for home.  The plan  will  be to keep her on Phenergan 12.5 mg p.o. t.i.d. and for her to take it  as directed from a GI standpoint, to follow up with them within 2-4  weeks.  Also for Dr. Gerilyn Pilgrim, the plan is to switch her to Keppra 500  mg one p.o. in the morning and two in the evening and for her to follow  up with him in 4 weeks and to have the EEG done at that point in time.  She will be sent home on the following medications which are her normal.   DISCHARGE MEDICATIONS:  1. Atenolol 25 mg one p.o. daily.  2. She will stop the phenytoin.  3. Seroquel 400 mg 1 tab at bedtime.  4. Ambien 10 mg at bedtime.  5. Amitiza 8 mcg twice a day.  6. Furosemide 40 mg p.o. daily.  7. Potassium chloride 10 mEq daily.  8. Cymbalta 60 mg daily.  9. Lisinopril 20 mg daily.  10.Also she was sent home on Levaquin p.o. daily times 5 days.  11.Phenergan 12.5 p.o. t.i.d.  12.An over-the-counter iron pill.  13.We increased her KCl from 10 mEq to 28 mEq p.o. daily.   Also the patient was not sent home on any type of narcotic medications  due to my experience with her and the Narcan, plus she was having issues  with altered mental status.  Also did not send her home on  any type of  antianxiety medications, even though she did receive them here in the  hospital.  This will be something she will need to take up with her  family care practitioner.   CONDITION AT DISCHARGE:  Stable.   DISPOSITION:  To home.   Her vitals and labs have been reviewed today.  The patient stated  understanding of plan of care.  She was concerned more about getting her  IV Phenergan than being interested in the plan of care at the time of  discharge.  However, hopefully she will follow up with doctors as  recommended.      Dorris Singh, DO  Electronically Signed     CB/MEDQ  D:  03/27/2009  T:  03/27/2009  Job:  [id number]

## 2011-04-14 NOTE — Assessment & Plan Note (Signed)
OFFICE VISIT   [name], Danielle L  DOB:  1961/03/25                                       07/23/2010  OZHYQ#:[id number]   I saw the patient in the office today for followup of her possible renal  artery stenosis.  This is a pleasant 50 year old woman whom I had seen  in consultation for possible renal artery stenosis on July 16, 2010.  She had an MRA which suggested a left renal artery stenosis although it  was somewhat difficult to quantitate this.  Her only complaint on her  previous visit was migraine headaches.  She had no flank pain or  hematuria, or significant problems with hypertension.  Since I saw her  last, again, she is continuing to have headaches and I have encouraged  her to go to the Emergency department to have these evaluated.  She also  states that she will follow up with her primary MD.   On review of systems, she has had no chest pain, chest pressure, or  palpitations.   On physical examination, this is a pleasant 50 year old woman who  appears her stated age.  Blood pressure is 82/50, heart rate is 83,  temperature is 97.8.  Lungs are clear bilaterally to auscultation.  She  did have a renal artery duplex today which shows that her renal aortic  ratio is 1.8 on the right and 2.3 on the left.  This would suggest only  mild renal artery stenosis of probably 50% or less.   I have reassured her that she has no evidence of significant renal  artery stenosis and at this point no further workup is indicated.  Her  biggest complaint appears to be her headaches and, again, I have  encouraged her to go to the Emergency department to have this evaluated  if she cannot get in to see her primary MD.  I have given her a  prescription for 2 Demerol to cover her until she can get help.  We will  see her back p.r.n.     [name], M.D.  Electronically Signed   CSD/MEDQ  D:  07/23/2010  T:  07/24/2010  Job:  [id number]   cc:   [name], PA

## 2011-04-14 NOTE — H&P (Signed)
NAMEMIYOSHI, LIGAS                ACCOUNT NO.:  [id number]   MEDICAL RECORD NO.:  [id number]          PATIENT TYPE:  INP   LOCATION:  IC04                          FACILITY:  [facility]   PHYSICIAN:  Osvaldo Shipper, MD     DATE OF BIRTH:  1961-05-18   DATE OF ADMISSION:  05/27/2009  DATE OF DISCHARGE:  LH                              HISTORY & PHYSICAL   The patient apparently does not have a primary medical doctor.  She is  followed by Dr. Kassie Mends for her GI issues, which include chronic  abdominal pain.  She is also apparently followed by Dr. Gerilyn Pilgrim for  seizure disorder and she is followed by Dr. Betti Cruz for her psychiatric  problems.   ADMITTING DIAGNOSES:  1. Altered mental status, likely secondary to benzodiazepine overdose.  2. History of seizure disorder.  3. History of depression and bipolar.  4. History of migraine headaches.   CHIEF COMPLAINT:  Unresponsive.   HISTORY OF PRESENT ILLNESS:  The patient is a 50 year old Caucasian  female who has a past medical history of bipolar disorder, depression,  gastroparesis, chronic abdominal pain and seizure disorder, who  apparently was in her usual state of health until about 9:00 p.m.  yesterday night.  This is according to her boyfriend.  The patient is  currently somnolent, not arousable, maintaining her respirations, but no  history is available from this patient.  So essentially according to the  boyfriend she was in her usual state of health until about 9:00 p.m.  yesterday when he saw her.  It appears that she appeared to be to the  boyfriend a little bit confused at times.  She was according to him  staggering.  This was at his home and then the patient told the  boyfriend that she was going to go to her place.  However, this did not  happen.  The patient was found outside [employer] unresponsive.  She was  actually found in their drive-thru.  EMS was called.  EMS gave her 2 mg  of Narcan with slight improvement.  She  was also found to be hypotensive  and she was brought into the hospital for further evaluation.  So  currently the patient again, she moans when I give her sternal rub, but  apart from that there is really no responsive.  So history is obviously  limited in this patient.  The boyfriend did mention that she was  complaining of shoulder pain a few days ago for which she took some  Tylenol over-the-counter.   MEDICATIONS AT HOME:  We only have one bottle of medication that is  Xanax 1 mg each tablet, 136 tablets were prescribed yesterday, of that,  only 125 tablets are left.  She was suppose to take it four times daily.  We also know that she is on Keppra, the dose is not clear.  She is also  on Cymbalta per her boyfriend.  I do not want to quote medication list  from her previous summaries because they have also been quoting from  previous summaries, so I told  the boyfriend to get all of her  medications tomorrow morning and give it to one of the nurse's so that  we can document accurately what she takes at home.   ALLERGIES:  She is allergic to a lot of medicines;  1. COMPAZINE.  2. ERYTHROMYCIN.  3. KEFLEX.  4. PENICILLIN.  5. REGLAN.  6. TETRACYCLINE.  7. TORADOL.  8. TRAMADOL.  9. ZOFRAN.   PAST MEDICAL HISTORY:  Based on previous records, she has a history of;  1. Chronic abdominal pain, thought to be functional.  2. She has a history of hypertension.  3. Depression.  4. Migraine headaches.  5. She has a history of peptic ulcer disease, status post Billroth II      procedure in 2007.  6. History of iron deficiency anemia.  She has completed a course of      IV iron therapy.  7. History of recent endoscopies in March, upper and lower.  8. 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  9. History of seizure disorder.  10.Chronic back pain, although the boyfriend tells me that she is not      taking any kind of narcotic drugs.   SOCIAL HISTORY:  Apparently, she lives here locally in  [place].  She  does not live with her boyfriend at this time.  Her boyfriend's name is  [employer].  His home phone number is [phone].  The patient's home  phone number is [phone].   FAMILY HISTORY:  Unable to obtain from the patient.   REVIEW OF SYSTEMS:  Unable to do because of altered mental status.   PHYSICAL EXAMINATION:  VITAL SIGNS:  Temperature 97.7, blood pressure  initially was 57/46, improving to 90s/50-60s.  Heart rate in the 70s,  regular.  Respiratory rate 16, saturation 99-100% on 2 liters.  GENERAL:  This is an obese white female maintaining her respirations,  not really responsive.  Appears to be in no distress otherwise.  HEENT:  There is no pallor, no icterus.  Pupils are dilated, reacting to  light, equal bilaterally.  Oral mucous membranes are slightly dry.  No  oral lesions are noted.  NECK:  Soft and supple.  No thyromegaly appreciated.  LUNGS:  Reveal a few rhonchi bilaterally.  No definite crackles are  present.  CARDIOVASCULAR:  S1-S2 is normal, regular.  No S3-S4.  No rubs, no  bruits, no murmurs, no JVD.  Pedal edema is absent.  Peripheral pulses  are present.  ABDOMEN:  Soft.  Scar from previous surgeries noted.  Nontender,  nondistended.  Bowel sounds are present.  No masses or organomegaly  appreciated.  Abdomen is obese.  GU:  She has got a Foley coming out, but otherwise full examination was  not done.  MUSCULOSKELETAL:  Otherwise unremarkable.  NEUROLOGIC:  Glasgow coma scale is about 7.  She only responds to  painful stimuli at this time.  Plantars are equal.   LABORATORY DATA:  Her white count is 8.8, hemoglobin 9.2, previous  hemoglobin was 10.7 on Apr 03, 2009, and actually was 9.5 on March 27, 2009.  MCV 77, platelet count 223, potassium 3.5, glucose 107,  BUN/creatinine are normal.  LFTs are normal.  Acetaminophen level was  17.7.  Urine beta HCG was negative.  Urine drug screen positive for  benzos, negative for opiates.  UA did  not show any infection.  She had a  CT head which was negative for any acute intracranial process.   She had an EKG  done which showed sinus rhythm with a normal axis.  Intervals appear to be in the normal range.  No definite Q-waves are  noted.  No concerning ST or T wave changes are noted.   ASSESSMENT:  This is a 50 year old Caucasian female who comes in  unresponsive.  The most likely etiology is benzodiazepine overdose.  She  has had about 11 tablets since the prescription was filled yesterday.  She is suppose to take only four of them in a 24-hour period.  She has  had almost double that.  Acute intracranial process has been ruled out.  The reason for her hypotension is not very clear, could be just  hypovolemia.  There is no clear evidence for sepsis.  A cardiac etiology  is also unlikely.   I think her behavioral and psychiatric issues are playing a big role  here.  I think she is being prescribed too many tablets of  benzodiazepines at one time.  I think this is going to require some  behavioral intervention.   PLAN:  1. Altered mental status secondary to benzodiazepine overdose.  We      will admit the patient to the hospital.  ABG did not show any CO2      retention and she seems to be maintaining her respirations quite      well.  We will have to repeat the ABG later on tonight.  I think we      will need to monitor her in the intensive care unit.  I hesitate      giving flumazenil because of her history of seizure disorder.  So      we will withhold that at this time.  We will mainly provide      supportive therapy.  2. Hypotension has improved with IV fluids.  We will keep a close      watch on her for now.  Etiology for hypotension is not very clear.      We will get an x-ray done to make sure she does not have any      pneumonia, though there is no real evidence for sepsis.  3. History of seizure disorder.  Continue with Keppra.  We will give      it intravenously.   4. We will repeat serum acetaminophen level in a couple of hours.  5. Hypokalemia will be repleted through the IV route.  6. We will await all of her medications to be brought in from home by      her boyfriend tomorrow morning.  DVT and GI prophylaxis will be      initiated.  7. Anemia.  She has a known history of iron deficiency anemia.  No      transfusion at this time.   Further management decisions will depend on results of further testing  and patient's response to treatment.   Please note the patient is a full code.      Osvaldo Shipper, MD  Electronically Signed     GK/MEDQ  D:  05/27/2009  T:  05/27/2009  Job:  [id number]   cc:   Daine Floras, M.D.  Fax: [phone]

## 2011-04-14 NOTE — Consult Note (Signed)
NAME:  [name], [name]                ACCOUNT NO.:  [id number]   MEDICAL RECORD NO.:  [id number]          PATIENT TYPE:  OBV   LOCATION:  A337                          FACILITY:  [facility]   PHYSICIAN:  Kofi A. Gerilyn Pilgrim, M.D. DATE OF BIRTH:  12/26/1960   DATE OF CONSULTATION:  04/03/2009  DATE OF DISCHARGE:  04/03/2009                                 CONSULTATION   REASON FOR CONSULTATION:  Altered mental status.   The patient is a 50 year old lady who is seen by [place] about a week ago  because of similar complaints.  She has a baseline history of epileptic  seizure and has been on Dilantin.  She reports being compliant with this  on the previous admission, but levels were undetected.  The patient has  had a recurrent seizure activity and confusion at home.  The patient  seem to have significant short-term memory, cannot relate a lot of the  history, but reports concerning about relatives or friends staying at  their home that may be taking her medications.  The patient accuses this  person of stealing her Xanax and possibly all the medications.  The  patient could not tell that she has been taking any of her seizure  medications.  In the last hospitalization, we discontinued Dilantin and  started her on Keppra because of concerns of not observing the  medication well.   PAST MEDICAL HISTORY:  1. Hypertension.  2. Anemia.  3. Crohn disease.  4. Seizures.  5. Migraines.  6. Bipolar disorder.  7. Depression.   PAST SURGICAL HISTORY:  Appendectomy, hysterectomy, declined placements  for IV infusion, and laparoscopic cholecystectomy.   SOCIAL HISTORY:  Nonsmoker.  No tobacco or illicit drug use.   ALLERGIES:  ZOFRAN, PENICILLIN, TETRACYCLINE, KEFLEX, REGLAN, COMPAZINE,  TORADOL, TRAMADOL, and ERYTHROMYCIN.   REVIEW OF SYSTEMS:  The patient reports that she has a lot of headaches,  is asking for pain medication, also complains of significant nausea.  No  fevers.  No focal neurological  deficits are reported.   ADMISSION MEDICATIONS:  1. Lisinopril 20 mg daily.  2. Alprazolam 1 mg q.i.d.  3. Ambien 10 mg at bedtime.  4. Potassium chloride 10 mEq daily.  5. She is supposed to be on Keppra 500 mg in the morning and 1000 mg      at bedtime.  Also, the medication list also lists phenytoin 300 mg      at bedtime, but this was discontinued on the last visit.  6. Furosemide 40 mg daily.  7. Bentyl 10 mg q.4 h.  8. Atenolol 25 mg daily.  9. Seroquel 400 mg at bedtime.  10.Cymbalta 60 mg daily.  11.Colace p.r.n.  12.Bactrim.  13.Amitiza 8 mg b.i.d.   PHYSICAL EXAMINATION:  GENERAL:  This is a moderately overweight lady in  no acute stress.  VITAL SIGNS:  Temperature 97.9, respiration 20, heart rate 77, and blood  pressure 120/91.  NEUROLOGIC:  The patient is awake and alert.  She is oriented to person  and place.  She is also oriented to the month and year, although,  she  had to look at the calendar.  She again has significant problems  relating to history, mostly this is significant short-term memory  impairment.  No dysarthria is observed.  CRANIAL NERVE:  Pupils are equally, round, and reactive to light and  accommodation.  Extraocular movements are intact.  Facial muscle  strength is symmetric.  Tongue is midline.  Uvula is midline.  Shoulder  shrug is normal.  MOTOR:  Normal tone, bulk, and strength.  There is no pronator drift.  Coordination, no dysmetria, tremors, or past-pointing.  No parkinsonism.  Reflexes are preserved.  Sensation is normal to light touch and  temperature.   LABORATORY DATA:  Sodium 131, potassium 5.1, chloride 102, CO2 of 20,  BUN 16, creatinine is slightly elevated at 1.5, glucose 91, and calcium  9.0.  WBC 9.9, hemoglobin 10, and platelet count of 325.  Urinalysis  negative.  Dilantin level undetected.  Urine drug screen positive for  benzodiazepines and opioids.  Her previous MRI again showed bitemporal  hyperintensity involving the  bilateral cerebellum seen on diffusion  imaging.  EEG shows a few left temporal epileptiform discharges.   ASSESSMENT AND PLAN:  1. Multiple recurrent seizures, off medications.  2. Short-term memory impairment.  3. Migraine headaches and nausea.  4. Poor supervision of medication at home.   RECOMMENDATIONS:  I think, we will start her back on Keppra.  We will  give her a loading dose of 1 g and start her back on 500 mg a.m. and  1000 mg at bedtime.  I think we will discontinue the Dilantin.  Also  made recommendations to Dr. Elige Radon that the patient should have some  home help with administration of her medication.      Kofi A. Gerilyn Pilgrim, M.D.  Electronically Signed     KAD/MEDQ  D:  04/04/2009  T:  04/05/2009  Job:  [id number]

## 2011-04-14 NOTE — Consult Note (Signed)
NEW PATIENT CONSULTATION   [name], Danielle L  DOB:  June 07, 1961                                       07/16/2010  YNWGN#:[id number]   I saw the patient in the office today in consultation concerning a  possible renal artery stenosis.  This is a pleasant 50 year old woman  with a history of hypertension.  She had an MRI of her renal arteries  which suggested a left renal artery stenosis with no significant right  renal artery stenosis.  She was sent for vascular consultation.  She  states that she noted the gradual onset of hypertension approximately 2  years ago.  She is unaware of any sudden changes in her blood pressure.  According to her records she has had labile blood pressure at times and  her highest blood pressure at one point was 180/120.  This has prompted  the MRA with the results as described above.  Of note, she has had no  hematuria.  She has had a long history of migraines for at least 10  years and this does not appear to be related to her blood pressure.  She  has had no flank pain.  She does have some mild low back pain.   PAST MEDICAL HISTORY:  Significant for hypertension,  hypercholesterolemia and a history of congestive heart failure.  She  denies any history of diabetes, history of myocardial infarction or  history of COPD.   PAST SURGICAL HISTORY:  She has had previous laparoscopic  cholecystectomy, partial gastrectomy for ulcers and an appendectomy.   SOCIAL HISTORY:  She is single.  She does not smoke cigarettes.   FAMILY HISTORY:  There is no history of premature cardiovascular  disease.   REVIEW OF SYSTEMS:  GENERAL:  She has had no recent weight loss, weight  gain or problems with her appetite.  She is 5 feet 4 inches, 180 pounds.  CARDIOVASCULAR:  She has had no chest pain, chest pressure, palpitations  or arrhythmias.  She has had no history of stroke or TIAs.  She has had  no history of DVT.  GI:  She has occasional diarrhea and  constipation.  NEUROLOGIC:  She has had some occasional dizziness.  She has had recent  migraines.  GU:  She has some dysuria.  MUSCULOSKELETAL:  She does have a history of arthritis.  PSYCHIATRIC:  She has had depression and anxiety in the past.  Pulmonary, hematologic, ENT review of systems is unremarkable.   PHYSICAL EXAMINATION:  General:  This is a pleasant 50 year old woman  who appears her stated age.  Vital signs:  Blood pressure is 98/60,  heart rate is 76, saturation 98%.  HEENT:  Unremarkable.  Lungs:  Are  clear bilaterally to auscultation without rales, rhonchi or wheezing.  Cardiovascular:  I do not detect any carotid bruits.  She has a regular  rate and rhythm.  She has palpable femoral pulses and palpable posterior  tibial pulses bilaterally.  She has no significant lower extremity  swelling.  Abdomen:  Soft and nontender.  I cannot appreciate an  abdominal bruit.  She has normal pitched bowel sounds.  Musculoskeletal:  There are no major deformities or cyanosis.  Neurological:  She has no  focal weakness or paresthesias.  Skin:  There are no ulcers or rashes.   I have reviewed her MRA.  It is somewhat difficult to evaluate the renal  arteries on this study.  However, it is interpreted as showing possibly  an ostial left renal artery stenosis and some mild diffuse narrowing of  the main right renal artery.  I have reviewed her lab studies also which  show a creatinine of 1.0 on 06/03/2010.  She has also had an ultrasound  of her kidneys which show that the right kidney measures 9.2 cm in  length and the left kidney 12.6 cm in length.  She does have some  increase in renal cortical echogenicity compatible with nonspecific  renal medical disease.  There was no hydronephrosis noted.   The MRA suggests possible left renal artery stenosis and also some mild  right renal artery disease.  It is difficult to really quantitate this  on the MRA and I have recommended that we  obtain a renal artery duplex  before proceeding with renal arteriography.  If the renal artery duplex  does not show significantly elevated velocities then I probably would  not proceed with arteriography at this time.  Currently she is only on  labetalol for blood pressure and her blood pressure seems to be well-  controlled at this point.  If the renal artery duplex does suggest  elevated velocities then I think we should proceed with arteriography to  further evaluate this and this could potentially be addressed with renal  angioplasty at the same time if we find significant disease.  I will see  her back next week and will schedule her renal artery duplex and make  further recommendations pending these results.  Of note, she was having  a significant migraine headache today and I have written a prescription  for 3 Demerol until she can get in to see her primary care doctor to  manage her migraine headaches.     [name], M.D.  Electronically Signed   CSD/MEDQ  D:  07/16/2010  T:  07/16/2010  Job:  [id number]   cc:   [name], PA

## 2011-04-14 NOTE — Group Therapy Note (Signed)
NAME:  [name], [name]:  [id number]   MEDICAL RECORD NO.:  [id number]          PATIENT TYPE:  INP   LOCATION:  A306                          FACILITY:  [facility]   PHYSICIAN:  Skeet Latch, DO    DATE OF BIRTH:  01-16-1961   DATE OF PROCEDURE:  03/26/2009  DATE OF DISCHARGE:                                 PROGRESS NOTE   SUBJECTIVE:  Patient is awake and alert.  Patient has slight abdominal  discomfort, but improved at this time.  Overall, patient seems to be  improving.  Still has episodes of confusion.  Patient is asking for pain  medication.   OBJECTIVE:  VITAL SIGNS:  Temperature is 97.9, pulse 79, respirations  20, blood pressure 149/93.  She is saturating 95% on room air.  CARDIOVASCULAR:  Regular rate and rhythm.  No murmurs, rubs, or gallops.  LUNGS:  Clear to auscultation bilaterally.  No rales, rhonchi, or  wheezing.  ABDOMEN:  Soft.  She has some generalized tenderness with some right  quadrant tenderness to deep palpation.  Positive bowel sounds.  EXTREMITIES:  No clubbing, cyanosis or edema.  NEUROLOGIC:  She is awake and alert.  Will answer questions  appropriately.  Episodes of confusion at times.   LABS:  White count is 8.5, hemoglobin 9.7, hematocrit 29.5, platelet  count 177,000.  Phosphorus is 4.8, magnesium is 1.8, sodium 143,  potassium 3.2, chloride 110, CO2 is 23, glucose 92, BUN 4, creatinine  0.66.  Patient's MRI showed an abnormal, unusual pattern of restricted  diffusion, as described above.  I believe the findings are most  consistent with __________ of the brain and an interictal or postictal  state.  I believe the constellation of findings is most consistent with  brain edema related to generalized seizures.   ASSESSMENT AND PLAN:  1. Acute on chronic abdominal pain.  She seems to be at her baseline      at this time.  Patient's diet has been increased, continuing with      antiemetics and oral pain medications.  2. Her  mental status changes could be secondary to possible seizure      activity.  Neurology is on the case.  IV Keppra eventually will be      switched to p.o. upon discharge.  Patient was in the process of      getting an EEG but there have been some technical difficulties.      Patient may need EEG as an outpatient.  3. For her hypokalemia, we will continue to replace orally.  We will      increase her daily potassium to 20 mEq twice a day.  Pending any      major complications, though, patient probably can be discharged in      the next 24 hours.      Skeet Latch, DO  Electronically Signed     SM/MEDQ  D:  03/26/2009  T:  03/26/2009  Job:  [id number] [id number]
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
## 2011-04-14 NOTE — Group Therapy Note (Signed)
NAME:  [name], [name]                ACCOUNT NO.:  [id number]   MEDICAL RECORD NO.:  [id number]          PATIENT TYPE:  INP   LOCATION:  A306                          FACILITY:  [facility]   PHYSICIAN:  Kofi A. Gerilyn Pilgrim, M.D. DATE OF BIRTH:  12-Feb-1961   DATE OF PROCEDURE:  DATE OF DISCHARGE:                                 PROGRESS NOTE   The patient reports that she is doing about the same.  Temperature 97.4,  pulse 96, respirations 20, blood pressure 152/87.  She is currently  today sleeping but easily arousable.  She is oriented to person, place  and year.  Eats fairly well and mostly coherent today.  MRI of the brain  is reviewed and shows a bright signal on clear imaging involving the  medial temporal area bilaterally.  It is symmetric.  There is also  bilateral basal ganglia lucency seen on T1 imaging indicative of lacunar  infarcts, more on the left side.  Dilantin level is undetected.  Ammonia  level 22.   ASSESSMENT:  Unusual magnetic resonance imaging findings.  Given the  history of epilepsy and undetected Dilantin, these findings are  suggestive of sequelae of seizures, possibly multiple seizures.  Could  be convulsive or nonconvulsive.  We are going to go ahead and load the  patient with Keppra.  It appears that she is not absorbing the Dilantin  and therefore we will just discontinue this.  The Keppra will be given  stat and maintain IV for now.  She has an EEG which is ordered and is  pending.  Hopefully, this will be obtained today.      Kofi A. Gerilyn Pilgrim, M.D.  Electronically Signed     KAD/MEDQ  D:  03/26/2009  T:  03/26/2009  Job:  [id number]

## 2011-04-14 NOTE — Discharge Summary (Signed)
[name], [name]                ACCOUNT NO.:  [id number]   MEDICAL RECORD NO.:  [id number]          PATIENT TYPE:  INP   LOCATION:  A321                          FACILITY:  [facility]   PHYSICIAN:  Renee Ramus, MD       DATE OF BIRTH:  11-12-1961   DATE OF ADMISSION:  05/26/2009  DATE OF DISCHARGE:  05/28/2009                               DISCHARGE SUMMARY   PRIMARY DISCHARGE DIAGNOSIS:  1. Benzodiazepine toxicity.   SECONDARY DIAGNOSES:  1. Chronic abdominal pain.  2. Hypertension.  3. Depression.  4. Migraine headaches.  5. Chronic iron deficiency anemia.  6. Epilepsy.  7. History of bipolar disorder.   HOSPITAL COURSE BY PROBLEM:  1. Benzodiazepine toxicity.  The patient is a 50 year old female who      has had multiple admissions secondary to medication misuses.  The      patient was found with mental status changes consistent with      benzodiazepine toxicity.  She tested positive for benzos and      negative for all else on her tox screen.  She does not appear to      have any other alternative diagnosis.  The patient recently filled      the prescription for Xanax and it was found to be reduced by      approximately 9-10 pills.  The patient filled the prescription      approximately 1 day prior to admission.  The patient reports that      it was a possibility she actually took more than what was      necessary.  She denies any type of suicidal ideation and suicide      attempt.  The patient reports that her boyfriend is going to help      her establish a pill cavity and she is going to use this at home to      prevent accidental overdose.  The patient is currently stable for      discharge.  2. Chronic abdominal pain.  The patient will continue on her      prehospital medications.  3. Hypertension.  The patient is on lisinopril and atenolol.  She will      continue this postdischarge.  4. Depression.  The patient will continue her SSRI.  5. Migraine headache.  This is  currently stable.  The patient will      take Maxalt on a p.r.n. basis.  6. Chronic iron deficiency anemia.  The patient reportedly gets IV      iron as an outpatient.  7. Epilepsy.  The patient will continue her gabapentin.   LABORATORY:  1. Mild metabolic acidosis with pH of 7.3, pCO2 of 43, and a pO2 of      144 on 2 L per minute nasal cannula oxygen consistent with an FIO2      of approximately 30%.  The patient does not have a significant A:A      gradient.  2. No leukocytosis, but chronic anemia with hemoglobin of 10.3 and      hematocrit of  30.5.  3. Negative cardiac enzymes x3.  4. Drug screen positive for benzos this with out of quant level.  5. Negative UA.   STUDIES:  1. Portable chest showing low volume with some bibasilar atelectasis,      likely secondary to low inspiratory effort.  2. CT head showing no bleed or acute intracranial process.   MEDICATIONS ON DISCHARGE:  1. Xanax 1 mg p.o. q.i.d. p.r.n. anxiety.  2. Phenergan 25 mg q.6 h. p.r.n. nausea and vomiting.  3. Cymbalta 90 mg p.o. daily.  4. Amitiza 8 mcg p.o. b.i.d.  5. Gabapentin 300 mg p.o. q.i.d.  6. Maxalt 10 mg p.o. daily p.r.n. migraine headache pain.  7. Amitriptyline 25 mg p.o. b.i.d.  8. Lisinopril 20 mg p.o. daily.  9. Lasix 40 mg p.o. daily.  10.Atenolol 25 mg p.o. daily.   No labs or studies pending at the time of discharge.  The patient is in  stable condition and anxious for discharge.  Time spent 35 minutes.      Renee Ramus, MD  Electronically Signed     JF/MEDQ  D:  05/28/2009  T:  05/29/2009  Job:  [id number]   cc:   Kassie Mends, M.D.  Kofi A. Gerilyn Pilgrim, M.D.  Daine Floras, M.D.

## 2011-04-14 NOTE — Group Therapy Note (Signed)
NAME:  [name], [name]                ACCOUNT NO.:  [id number]   MEDICAL RECORD NO.:  [id number]          PATIENT TYPE:  INP   LOCATION:  A306                          FACILITY:  [facility]   PHYSICIAN:  Kofi A. Gerilyn Pilgrim, M.D. DATE OF BIRTH:  1961/04/24   DATE OF PROCEDURE:  03/27/2009  DATE OF DISCHARGE:                                 PROGRESS NOTE   The patient does not have any new complaints.  She continues to complain  of pain and is requesting pain medication.   Temperature 97.5, pulse 80, respirations 20, blood pressure 166/96.  She  is awake, alert.  She converses fairly well, has good spontaneous  speech.  She is oriented to person, place, year and month.  She moves  all four extremities.   I did discuss with the patient about her EEG and MRI findings and that  we need to switch her to Keppra.  Actually, we did have a discussion  yesterday and she has forgotten it already.  She reported that she  continued to have short-term memory impairment, which seems to be  ongoing with the patient.  So far, she is able to tolerate a normal  diet.   ASSESSMENT AND PLAN:  Unusual MRI findings by mesiotemporal, caudate and  cerebellar hyperintensity.  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  The acute findings suggest seizures.  Therefore, she has been  switched from Dilantin as this was not being observed.  She continues to  have significant memory impairment, which I suspect is coming from  frequent seizure activity.  We will switch the patient to p.o. Keppra  and have her stay on 1500 mg for now.  She may need to be increased with  this.  However, EEG was reviewed and shows left temporal epileptiform  discharges, but no evidence of ongoing seizure activity.      Kofi A. Gerilyn Pilgrim, M.D.  Electronically Signed     KAD/MEDQ  D:  03/27/2009  T:  03/27/2009  Job:  [id number]

## 2011-04-14 NOTE — Assessment & Plan Note (Signed)
NAMEMarland Kitchen  RENI, HAUSNER                 CHART#:  [id number]   DATE:  01/25/2009                       DOB:  06-03-1961   PRIMARY CARE PHYSICIAN:  Aniceto Boss, [facility],  [place], [place], fax: [phone]   CHIEF COMPLAINT:  Follow up anemia.   PROBLEM LIST:  1. Gastroparesis.  2. Chronic abdominal pain.  3. Anemia.  4. Rectal bleeding likely secondary to benign anorectal source,      colonoscopy in 2007 for rectal bleeding.  5. Billroth II.  6. EGD in August 2007 by Dr. Cira Servant.  7. NASH.   SUBJECTIVE:  The patient is a 50 year old Caucasian female.  She returns  for a visit because she was found to have anemia with a hemoglobin of  8.1, hematocrit 25.1, and MCV of 72.8.  Her platelet count was 292,  white blood cell count was normal.  She had a high sed rate of 58.  Her  CRP was normal.  She had a negative ANA, negative rheumatoid factor, low  vitamin D.  Normal creatinine and alkaline phosphatase of 128, otherwise  LFTs were normal.  She had a normal TSH and a normal CRP.  She tells me  she was treated for H. pylori years ago prior to her gastrectomy.  She  denies any shortness of breath, chest pain, or cough.  She has had some  trace lower extremity edema.  She has had dark stools because she has  been on iron for about 1 week.  She was taking Aleve for twice a day for  about a week prior to the fact that she was found that she was anemic.  She has since discontinued this.  She has noted some vaginal spotting.  She has been taking Carafate.  She believes she may have been taking  something for her stomach, but denies any PPI use at this time.  Her  weight has steadily increased.  Her appetite is good.  She denies any  nausea or vomiting.  Denies any heartburn, indigestion, dysphagia, or  odynophagia.   CURRENT MEDICATIONS:  See the list from January 25, 2009.   ALLERGIES:  Multiple Reglan, Haldol, Zofran, Imitrex, penicillin,  tetracycline, erythromycin, Keflex,  Compazine, Toradol, and tramadol.   OBJECTIVE:  VITAL SIGNS:  Weight 196 pounds, height 64-1/2 inches,  temperature 98 degrees, blood pressure 180/78, and pulse 64.GENERAL:  She is an anxious-appearing obese Caucasian female, who is alert,  oriented, pleasant, cooperative, no acute distress.HEENT:  Sclerae  clear, nonicteric.  Conjunctivae pink.  Oropharynx pink and moist  without any lesions.CHEST:  Heart, regular rate and rhythm.  Normal S1  and S2.ABDOMEN:  Protuberant with positive bowel sounds x4.  No bruits  auscultated.  Soft, nontender, nondistended without palpable mass or  hepatosplenomegaly.  No rebound, tenderness, or guarding.EXTREMITIES:  Without clubbing or edema.  RECTAL:  No external lesion is visualized.  Good sphincter tone.  No  internal masses palpated.  Small amount of light brown stool and clear  secretions were obtained from the vault, which were Hemoccult negative.   ASSESSMENT:  1. The patient is a 50 year old Caucasian female with a microcytic      anemia with a hemoglobin of 8.1.  She has had dark stools  without      overt gastrointestinal bleeding.  I suspect she  may have some      chronic gastrointestinal bleeding.  She has hx of bypass, most      likely has altered absorption as well.  Hemoccult was negative      today in the office.  We will follow up with this.  If she has      evidence of Hemoccult-positive stools/gastrointestinal bleed, she      is going to need an EGD followed by a small bowel given the fact      that she has had a Billroth procedure.  She also needs to be      checked for celiac disease as well as be sure that her Helicobacter      pylori has been treated.  2. Gastroparesis, well controlled at this time.  3. Chronic abdominal pain.  4. Nonalcoholic steatohepatitis.   PLAN:  1. Begin omeprazole 20 mg daily, #31 with 2 refills.  2. Check H. pylori stool, celiac antibody panel, CBC, and anemia      panel.  3. Avoid NSAIDs.  4.  Hemoccult stools x3.  5. If her stools are positive, we will discuss further with Dr. Cira Servant      whether she is going to need EGD plus/minus Given capsule      placement.   ADDENDUM 04540:  Repeat EGD-Bx/TCS-Bx performed today. No source for  anemia idetified. Hemorrhoids or mild gastritis likely reason for heme  pos stool.  TCS in 10 years. FeDA s most likely secondary to post-  gastrectomy state. Proceed with IV iron with Dr. Mariel Sleet. Transfusion  for pRBCs cancelled. V.o. given to Dana. Discussed plan with pt.  Will likely need SBFT. Discussed with radiology. No imaging modality  will help visualize the afferent limb.       Lorenza Burton, N.P.  Electronically Signed     Kassie Mends, M.D.  Electronically Signed   KJ/MEDQ  D:  01/25/2009  T:  01/25/2009  Job:  [id number]   cc:   Ladona Horns. Mariel Sleet, MD

## 2011-04-14 NOTE — Assessment & Plan Note (Signed)
NAME:  [name], [name]                 CHART#:  [id number]   DATE:  08/31/2008                       DOB:  [birth year]   REFERRING PHYSICIAN:  Dr. Dierdre Forth at [facility].   PROBLEM LIST:  1. Gastroparesis.  2. Chronic abdominal pain.  3. Rectal bleeding likely secondary to benign anorectal source with      colonoscopy in 2007 performed for rectal bleeding.  4. Billroth II seen on EGD in August 2007.  5. Nonalcoholic steatohepatitis.   SUBJECTIVE:  The patient is a 50 year old female who is seen as a return  patient visit.  She was last seen and evaluated in February 2009.  She  is currently complaining of intermittent rectal bleeding twice a week,  and vomiting 2 times a day.  She reports staying thirsty.  She has  gained 7 pounds since her last visit.  She is out of Phenergan.  She  said she was seen in the emergency department for seizure disorder and  her blood sugar was low.  A Dilantin level was 5.  She plans to  establish care with [facility] and [facility] [place].   She also complains of rectal pain when stool comes out.  She has seen  blood twice a week.  She has her chronic abdominal pain.  She complains  of being constipated and having to sit and bend and rock on the commode  in order for stool to come out.  She has a bowel movement once a week.  She has increased fiber in her diet.  Her water intake is eating ice.  She never been tried on tricyclic antidepressants or Amitiza.   MEDICATIONS:  1. Diltiazem 300 mg nightly.  2. Cymbalta 60 mg daily.  3. Xanax as needed.  4. Lasix 40 mg daily.  5. Potassium daily.  6. Lisinopril 10 mg daily.  7. Atenolol 25 mg daily.  8. Seroquel 300 mg nightly.   OBJECTIVE:  PHYSICAL EXAM:  VITAL SIGNS:  Weight 194 pounds, height 5 feet 4 inches, temperature  97.9, blood pressure 102/78, and pulse 80.  GENERAL:  She is in no apparent distress.  Alert and oriented x4.HEENT:  Atraumatic and  normocephalic.  Pupils equal and react to light.  Mouth,  no oral lesions.  Posterior pharynx without erythema or exudate.NECK:  Full range of motion.  No lymphadenopathy.LUNGS:  Clear to auscultation  bilaterally.  CARDIOVASCULAR:  Regular rhythm.  No murmur.  Normal S1 and S2.ABDOMEN:  Bowel sounds present.  Soft, nondistended, mild tenderness to palpation  in the periumbilical region without rebound or guarding.EXTREMITIES:  No  cyanosis or edema.NEUROLOGIC:  She has no focal neurologic deficits.   ASSESSMENT:  The patient is a 50 year old female who has rectal  bleeding, which is intermittent and a chronic issue.  She has chronic  abdominal pain.  Her last CT scan was in January 2008, which showed  postoperative changes and no acute findings.  She also has gastroparesis  and continues to have intermittent vomiting.  She has had no weight loss  associated with this vomiting.  Thank you for allowing me to see the  patient in consultation.  My recommendations follow.   RECOMMENDATIONS:  1. She is asked to use powder fiber instead of high-fiber foods due to  her history of gastroparesis.  She is asked to continue to follow      the gastroparesis diet.  2. She may use Phenergan as needed for vomiting.  I did explain the      nature of gastroparesis.  She is going to intermittently have      vomiting.  The important thing is that she maintains her hydration.      She has no evidence of dehydration at this point.  3. For rectal bleeding, which is a benign anorectal source due to the      fact she had colonoscopy in 2007, she may use Anusol HC every 12      hours for 10 days.  If she continues to have rectal bleeding and      desires definitive therapy, then I would be more happy to give her      a surgery referral.  4. For constipation, add Amitiza 8 mcg b.i.d.  She has a return      patient visit to see me in 2 months.       Kassie Mends, M.D.  Electronically Signed      SM/MEDQ  D:  08/31/2008  T:  08/31/2008  Job:  [id number]   cc:   Dr. Dierdre Forth

## 2011-04-14 NOTE — Op Note (Signed)
NAME:  [name], HUSBY NO.:  [id number]   MEDICAL RECORD NO.:  [id number]          PATIENT TYPE:  AMB   LOCATION:  DAY                           FACILITY:  [facility]   PHYSICIAN:  Kassie Mends, M.D.      DATE OF BIRTH:  [birth year]   DATE OF PROCEDURE:  02/14/2009  DATE OF DISCHARGE:                               OPERATIVE REPORT   REFERRING Raymel Cull:  Prudy Feeler, PA-C, [facility], fax [phone].   PROCEDURE:  1. Colonoscopy with cold forceps biopsy.  2. Esophagogastroduodenoscopy with cold forceps biopsy of the afferent      and efferent limb and gastric nodules.   INDICATION FOR EXAM:  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  The operative  note is not available to me.  She presents with iron-deficiency anemia.  She also had heme-positive stools.   FINDINGS:  1. Nodular-appearing colon from the cecum to the sigmoid colon.      Biopsies obtained via cold forceps.  Most likely the etiology are      lymphoid hyperplasia.  Otherwise, no evidence of polyps, masses,      inflammatory changes, or diverticular AVMs.  2. Small internal hemorrhoids.  Otherwise, normal retroflexed view of      the rectum.  3. Normal esophagus without evidence of Barrett mass, erosion,      ulceration, or stricture.  A 6-mm and 3-mm gastric nodule seen near      the anastomosis.  Biopsies obtained via cold forceps.  Otherwise,      the gastric remnant was normal.  4. The anastomosis is normal.  5. The scope was passed to 60 cm from the teeth down the efferent      limb.  No abnormalities were seen.  The biopsies obtained via cold      forceps to evaluate for any evidence of celiac sprue.  The scope      was passed to approximately 60 cm from the teeth in the afferent      limb and no abnormalities were seen.  The biopsies were obtained      via cold forceps and sent to evaluate for a celiac sprue.   DIAGNOSES:  1. No obvious source for iron-deficiency anemia identified.  2. Heme-positive stools likely secondary to gastric inflammation or      internal hemorrhoids.  3. 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   RECOMMENDATIONS:  1. She should avoid aspirin and anti-inflammatory drugs for 30 days.      No anticoagulation for 7 days.  2. Will call her with the results of her biopsies.  3. Screening colonoscopy in 10 years.  4. She should follow a high-fiber diet.  She was given a handout on      high-fiber diet and hemorrhoids.   MEDICATIONS:  1. Fentanyl 100 mcg.  2. Versed 10 mg IV.  3. Phenergan 37.5 mg IV.   PROCEDURE TECHNIQUE:  Physical exam was performed.  Informed consent was  obtained from the  patient explaining the benefits, risks, and  alternatives to the procedure.  The patient was connected to monitor and  placed in left lateral position.  Continuous oxygen was provided by  nasal cannula and IV medicine was administered through an indwelling  cannula.  After administration of sedation and rectal exam, the  patient's rectum was intubated.  The scope was advanced under direct  visualization to the cecum.  The scope was removed slowly by carefully  examining the color, texture, anatomy, and integrity mucosa on the way  out.   After the colonoscopy, the patient's esophagus was intubated with a  diagnostic gastroscope.  The scope was advanced under direct  visualization to the efferent limb and then to the afferent limb.  The  scope was removed slowly by carefully examining the color, texture,  anatomy, and integrity mucosa on the way out.  The patient was recovered  in Endoscopy.  She did have some postprocedure nausea and was given a  dose of Phenergan 12.5 mg IV.  She was discharged home in satisfactory  condition.   PATH:  Inflammed gastric mucosa. Benign small bowel and colon.      Kassie Mends, M.D.  Electronically Signed      SM/MEDQ  D:  02/14/2009  T:  02/14/2009  Job:  [id number]   cc:   Ladona Horns. Mariel Sleet, MD  Fax: [phone]   Prudy Feeler, PA-C  [facility]
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
## 2011-04-17 NOTE — Consult Note (Signed)
NAME:  Danielle, [name]:  [id number]   MEDICAL RECORD NO.:  [id number]           [name] TYPE:   LOCATION:                                FACILITY:  [facility]   PHYSICIAN:  Kassie Mends, M.D.      DATE OF BIRTH:  March 09, 1961   DATE OF CONSULTATION:  DATE OF DISCHARGE:                                   CONSULTATION   REQUESTING PHYSICIAN:  Erle Crocker, M.D.   Dear Dr. Jen Mow:   I am seeing [name] as a new [name] in consultation per your request.  I  am seeing her for vomiting.   [name] is a 50 year old female who has a significant past medical  history of a partial gastrectomy in 1997 due to bleeding peptic ulcer  disease from [facility].  She complains of taking two to three bites and  then feeling bloated.  Her stomach has been troubling her for 10 years.  She  saw blood in her emesis once.  She has had weight gain, but not weight loss.  She eats small amounts frequently.  She may have seven to 12 bowel movements  a day.  She reports vomiting every night.  She reports Prilosec, AcipHex and  Protonix never helped; however, Maalox may have helped her vomiting.  She  reports vomiting three times during Danielle daytime at least two times a week.  This daytime vomiting has occurred for Danielle last year.  She denies any  difficulty swallowing.  She only has diarrhea if she is anxious.  She  reports her last upper endoscopy was six years.   PAST MEDICAL HISTORY:  1.  Migraines.  2.  Anemia.  3.  Ulcers.  4.  Depression.  5.  Anxiety.  6.      Hypertension.  7.  Seizures.  8.  Neuropathy.  2.  Back pain.  10.  Pneumonia.  11.  Arthritis.  12.  Constipation.  13.      Urinary tract infections.   PAST SURGICAL HISTORY:  1.  Cholecystectomy at age 78 in [place], [place] for unknown reasons.  2.      Tubal ligation.  3.  Appendectomy.  4.  Tonsillectomy.   ALLERGIES:  PENICILLIN, TETRACYCLINE, ERYTHROMYCIN, KEFLEX, COMPAZINE,  TORADOL,  TRAMADOL. Dystonic  reaction to Reglan eight years ago.   MEDICATIONS:  Dilantin, Diovan, Cymbalta and Xanax.   FAMILY HISTORY:  [name] denies any family history of colon cancer or  colon polyps.   SOCIAL HISTORY:  [name] eats tons of ice.  She denies any aspirin or  NSAID use.  She quit drinking alcohol 12 years ago.  She denies any tobacco  use.  She is unemployed.  She is single.   REVIEW OF SYSTEMS:  [name]'s review of systems is per Danielle HPI,  otherwise all systems are negative.   PHYSICAL EXAMINATION:  VITAL SIGNS:  On physical exam weight is 212 pounds,  height 5 foot 4 ounces, temperature 97.5, blood pressure 142/98, and pulse  64.  GENERAL APPEARANCE:  In general this is in  no apparent distress.  Alert  and oriented times four.  HEENT EXAMINATION:  Normocephalic and atraumatic.  Pupils equal and react to  light.  Mouth with no oral lesions.  Posterior pharynx without erythema or  exudates.  NECK:  [name]'s neck has full and no lymphadenopathy.  LUNGS:  [name]'s lungs are clear to auscultation bilaterally.  HEART:  Cardiovascular exam showed regular rhythm.  No murmurs. Normal S1 and S2.  ABDOMEN:  On abdominal exam her bowel sounds are present.  Nontender and  nondistended.  No rebound or guarding.  No hepatosplenomegaly.  Obese.  EXTREMITIES:  Danielle extremities are without cyanosis, clubbing or edema.  NEUROLOGIC EXAMINATION:  [name] has no focal neurological deficits.   DISCUSSION:  [name] is a 50 year old lady with nausea, vomiting, and  diarrhea.  Danielle differential diagnoses include:  1.  Gastroparesis, 2.  Celiac Sprue, 3.  Gastritis,  4.  Peptic ulcer      disease, or 5.  Anastomotic ulcer.  Thank you for allowing me to see [name] in consultation.  My  recommendations follow.   RECOMMENDATIONS:  1.  I recommend EGD within Danielle next seven to 10 days.  2.  If Danielle EGD is negative then a gastric emptying study will be performed      with what remains of  her stomach.  3.  I have given her a prescription for Phenergan 25 mg tablets, #30, with      three refills.  4.  [name] may follow up in three months.  5.  [name] was instructed to initiate Danielle gastroparesis diet.      Gastroparesis diet handout given and explained.   Please feel free to contact me at [phone] with additional questions.      Kassie Mends, M.D.  Electronically Signed     SM/MEDQ  D:  06/25/2006  T:  06/26/2006  Job:  [id number]   cc:   [facility]

## 2011-04-17 NOTE — Discharge Summary (Signed)
[name], [name]:  [id number]   MEDICAL RECORD NO.:  [id number]          PATIENT TYPE:  INP   LOCATION:  5735                         FACILITY:  [facility]   PHYSICIAN:  Ileana Roup, M.D.  DATE OF BIRTH:  04-29-1961   DATE OF ADMISSION:  11/12/2004  DATE OF DISCHARGE:  11/16/2004                                 DISCHARGE SUMMARY   DISCHARGE DIAGNOSES:  1.  Gastritis.  2.  Hypertension.  3.  Hyperlipidemia.  4.  Hypokalemia.  5.  Depression.  6.  Decreased TSH decreased to 54.   DISCHARGE MEDICATIONS:  1.  Thiamine 1.1 mg p.o. daily.  2.  Folic acid 1 mg p.o. daily.  3.  Protonix 40 mg p.o. daily.  4.  Darvocet 100 mg q.6h. p.r.n.  5.  Phenergan 12.5 mg q.6h. p.o. p.r.n.  6.  Ambien 5 mg q.h.s.  7.  HCTZ 25 mg p.o. daily.   CONSULTANT:  Roosvelt Harps, M.D., gastroenterology.   PROCEDURES:  1.  On November 15, 2004, endoscopy that showed mild gastritis and Billroth      II status post surgery.  2.  CT of the abdomen without contrast and with contrast on November 12, 2004, which showed no evidence of acute pancreatitis or other acute      process and postoperative changes related to cholecystectomy and      apparent partial gastric resection.  No acute findings in pelvic area.  3.  On November 12, 2004, abdominal plain x-ray showed minimal partial small      bowel obstruction or ileus.   BRIEF HISTORY OF PRESENT ILLNESS:  A 50 year old white lady with a past  medical history significant for peptic ulcer disease status post partial  gastrectomy on Cardizem with repeated episodes of acute pancreatitis, who  presents to the ER for nausea, vomiting, and abdominal pain.  She has been  having abdominal pain for six months that improved with Tylenol.  Approximately a week prior to admission after the patient had several drinks  and pizza, noticed an intense epigastric pain 10/10 with radiation on the  left and to the back.  She also started  having nausea 4-5 days prior to  admission and vomiting also 4-5 days prior to admission.  She reported  coffee-ground vomit.  The patient admitted diarrhea, soft black-brown stool,  chest pain tight, radiating to the arm, intensified by arm movement.   ALLERGIES:  PENICILLIN, REGLAN, TETRACYCLINE, CEPHALOSPORIN, MORPHINE,  DILAUDID, and COMPAZINE.   PAST MEDICAL HISTORY:  Significant for cholecystectomy in 1976, tubal  ligation at age 8, appendectomy in 2004, and partial gastrectomy in 1998.  Pancreatitis, recurrent in 2004 and 2005.  Bronchoscopy in 2005 showed mild  narrowing of the left bronchus.   DICTATION ENDED AT THIS POINT       DA/MEDQ  D:  11/16/2004  T:  11/17/2004  Job:  [id number]   cc:   [facility]

## 2011-04-17 NOTE — Discharge Summary (Signed)
NAME:  DESTANE, SPEAS NO.:  [id number]   MEDICAL RECORD NO.:  [id number]          PATIENT TYPE:  OBV   LOCATION:  A330                          FACILITY:  [facility]   PHYSICIAN:  Osvaldo Shipper, MD     DATE OF BIRTH:  03-19-1961   DATE OF ADMISSION:  12/07/2006  DATE OF DISCHARGE:  01/10/2008LH                               DISCHARGE SUMMARY   Please note this is actually a discharge summary on an individual who  left against medical advice.   Please review H&P dictated at time of admission for details regarding  the patient's presenting illness.   BRIEF HOSPITAL COURSE:  Briefly, this is a 50 year old Caucasian female,  who presented with symptoms of nausea, vomiting, diarrhea, was also  found to have a UTI.  She was also having abdominal pain, which was more  exacerbated, compared to the examination findings.  The patient had  undergone a CAT scan recently, which did not show any acute  abnormalities.  She had an acute abdominal series, which again did not  show any acute abnormalities.  The patient is known from previous  admission to seek pain medications.  We made it very clear that we would  treat her nausea and vomiting while she was in the hospital and will not  provide any narcotic agents.  The patient understood this.  We tried to  treat her symptoms with Phenergan, Zofran and also with some IV fluids.  The patient did not show much improvement and she requested some pain  medication, requested higher doses of Phenergan.  Unfortunately, we also  lost IV access.  However, I did not feel that she needed a PICC line  because her symptoms were not entirely convincing.  At times, she was  found to be lying in the bed with no discomfort and, as soon as a nurse  or a physician would walk in, she would start squirming in the bed and  complain of pain.  In any case, she requested some pain medication from  one of her night physicians, Dr. Lilian Kapur.  He refused this.   He also  told her that, if she continues to have nausea and vomiting, we would  feel inclined to insert an NG tube to alleviate some of those symptoms.  The patient, at that point, refused all further treatment and she  requested that she be signed out, AMA.  Subsequently, she left against  medical advice on early morning January 10.      Osvaldo Shipper, MD  Electronically Signed     GK/MEDQ  D:  12/10/2006  T:  12/10/2006  Job:  [id number]

## 2011-04-17 NOTE — Consult Note (Signed)
NAMESALVATORE, POE NO.:  [id number]   MEDICAL RECORD NO.:  [id number]          PATIENT TYPE:  INP   LOCATION:  5735                         FACILITY:  [facility]   PHYSICIAN:  Althea Grimmer. Santogade, M.D.DATE OF BIRTH:  03/11/1961   DATE OF CONSULTATION:  11/13/2004  DATE OF DISCHARGE:                                   CONSULTATION   [name] is a 50 year old female whom I am asked to see for possible  coffee-ground emesis, abdominal pain, and diarrhea. She is a very pressured  historian in terms of speech and has a history of polysubstance abuse. She  is status post gastrojejunostomy for bleeding ulcer disease with reported  partial gastrectomy in 1998. She also reports to met that she has been  diagnosed in the past with pancreatitis, but we have no records to verify  this.  She reportedly came to the hospital with nausea, vomiting, possible  coffee-ground emesis, and watery diarrhea. She had no melena. She had no  hematochezia. In the hospital she has been treated with Protonix IV and  Phenergan along with Stadol. At the current time she still says she is  having pain in the right epigastrium and she vomited once today which is a  chronic problem for her. She also is having diarrhea, which I observed and  it is watery brown with some particulate material. There is no melena and  there is no stool. Prior to admission she admits drinking six beers and her  blood toxicology was positive for benzodiazepines and cocaine, though she  insists she does not use cocaine. Although she says she has a pain in her  abdomen, reports that the pancreas appears normal. There is evidence for a  partial gastrectomy and a gastrojejunostomy in 1998, appendectomy,  cholecystectomy, hysterectomy, and depression.   CURRENT MEDICATIONS:  Protonix, thiamine, Stadol, and Phenergan.   ALLERGIES:  MORPHINE, COMPAZINE, REGLAN, ERYTHROMYCIN, HYDROMORPHONE,  PENICILLIN, CEPHALOSPORIN, and  TETRACYCLINE.   FAMILY HISTORY:  Noncontributory.   SOCIAL HISTORY:  Nonsmoker. History of polysubstance abuse, although she  says she stopped drinking; she was drinking six beers prior to admission.  Although she does not use cocaine any longer, her blood toxicology was  positive for cocaine. She is divorced and she reports that her 14 year old  son died several years ago.   PHYSICAL EXAMINATION:  GENERAL: She is a well-developed, mildly obese, adult  female.  SKIN: There is a rash around the ankles, but she says it is to her socks.  HEENT: Eyes are anicteric. Oropharynx is unremarkable.  NECK: Supple without thyromegaly.  There is no cervical or inguinal  lymphadenopathy.  CHEST: Sounds clear.  HEART: Regular rate and rhythm without murmurs, rubs, or gallops.  ABDOMEN: Active bowel sounds. Mild right epigastric tenderness. No rebound  or hernia.  RECTAL EXAM: Not performed.  EXTREMITIES: Without edema.   LABORATORY DATA:  Hemoglobin 13, white blood cell count 11.3, platelet count  256,000.   IMPRESSION:  This is a 50 year old female with a history of ulcer disease,  status post gastrojejunostomy presenting with vomiting and diarrhea. There  is no evidence for acute pancreatitis based on her enzymes and the CT does  not support a diagnosis of chronic pancreatitis. There are no calcifications  mentioned in the CT picture of the pancreas. She is not significantly  bleeding at this time and I do not think that an emergent endoscopy is in  order. Unfortunately the endoscopy schedule is so tapped I cannot get her on  the schedule, though I will address this again tomorrow.  Perhaps she should  be treated presumptively with b.i.d. Protonix and clear liquid diet until  she is tolerating things better. Stool for qualitative fecal sac could be  checked to see if there is any evidence for pancreatic malabsorption. If she  does allow better consideration could be given to discharge with  outpatient  endoscopy in the ensuing week. I would be glad to follow along, but I am not  positive I can get her on the endoscopy schedule during the daytime  tomorrow.       PJS/MEDQ  D:  11/13/2004  T:  11/13/2004  Job:  [id number]   cc:   Ileana Roup, M.D.  [address], [place] [address]  Fax: [phone]
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
## 2011-04-17 NOTE — H&P (Signed)
NAME:  [name], [name]:  [id number]   MEDICAL RECORD NO.:  [id number]          PATIENT TYPE:  OBV   LOCATION:  A330                          FACILITY:  [facility]   PHYSICIAN:  Osvaldo Shipper, MD     DATE OF BIRTH:  1961/07/10   DATE OF ADMISSION:  12/07/2006  DATE OF DISCHARGE:  LH                              HISTORY & PHYSICAL   The patient [facility] to the [facility] department for her medical needs.   ADMITTING DIAGNOSES:  1. Acute gastroenteritis, likely viral.  2. Urinary tract infection.  3. History of hypertension.  4. History of seizure disorder.  5. History of chronic abdominal pains.   CHIEF COMPLAINT:  Abdominal pain, nausea, and vomiting for the past 5  days.   HISTORY OF PRESENT ILLNESS:  The patient is a 50 year old Caucasian  female who has medical problems as stated above.  She presented to the  ED with worsening abdominal pain, nausea and vomiting.  The patient had  presented back on January 6 with similar complaints.  She says that she  has been having these symptoms over the past 5 days. They have not  improved.  She has also noted some burning sensation on urination.  Emesis mostly consisted of clear liquids and then she was having a lot  of dry heaves.  Abdominal pain is located in the upper abdomen as well  as in the middle part of the abdomen, nonradiating.  The pain is 10/10  in intensity, no aggravating or relieving factors.  Associated symptoms  include nausea and vomiting.  She has also been having some diarrhea and  loose stools for the past few days as well.  Her last bowel movement was  just a few hours ago.   MEDICATIONS AT HOME INCLUDE THE FOLLOWING:  1. Lisinopril 10 mg daily.  2. Xanax 1 mg p.o. t.i.d.  3. Dilantin 100 mg q.h.s.  4. Lasix 20 mg once a day.  5. Potassium chloride unknown dose once a day.  6. Compazine and Phenergan as needed for nausea.  7. Cymbalta 60 mg once a day.   ALLERGIES:  She is allergic to HALDOL,  KEFLEX, MORPHINE, PENICILLIN,  REGLAN, TETRACYCLINE, TORADOL, and TRAMADOL--none of these cause acute  anaphylaxis.   PAST MEDICAL HISTORY:  Includes hypertension and seizure disorder,  anxiety disorder, and depression.  As mentioned, she has a history of  pancreatitis diagnosed about a year ago.  She does not know the  etiology.  She says that she used to drink alcohol in the past, but has  not been doing so currently.   Review of her old records shows that just her lipase was elevated back  in January 2007, but her amylase was normal.  She has not had any other  elevated pancreatic enzymes ever.   SURGICAL HISTORY:  Includes partial gastrectomy for gastritis and GI  bleeding, cholecystectomy, tubal ligation, and exploratory laparotomy  with appendectomy in the past.   SOCIAL HISTORY:  Lives in [place] with her fiance.  No smoking use, no  alcohol use, no illicit drug use.  She is independent with the daily  activities.   FAMILY HISTORY:  Significant for pancreatitis in one of her aunts, lung  cancer in her father.  There is also history of tuberculosis,  hypertension, and diabetes in the family.   REVIEW OF SYSTEMS:  GENERAL:  Positive for weakness.  CARDIOVASCULAR:  Unremarkable.  RESPIRATORY:  Unremarkable.  GI:  See HPI.  GU:  Unremarkable.  RENAL:  Unremarkable.  MUSCULOSKELETAL:  Unremarkable.  NEUROLOGIC:  Unremarkable.  ENDOCRINE:  Unremarkable.  Other review of  systems unremarkable.   PHYSICAL EXAMINATION:  VITAL SIGNS:  Temperature 97.7, blood pressure  131/91, heart rate 100, respiratory rate 18, saturation 96% on room air.  GENERAL EXAM:  This is an obese white female who appears to be in quite  a bit of discomfort, but in no distress.  HEENT:  There is no pallor.  No icterus.  Her mucous membranes are  moist.  No oral lesions are noted.  LUNGS:  Clear to auscultation bilaterally.  CARDIOVASCULAR:  S1-S2 is normal, regular, no murmurs appreciated.  ABDOMEN:   Soft, obese, scars from previous surgery is noted.  Tenderness  is not appreciable.  The patient has exaggerated response to physical  examination at times.  Bowel sounds are present, no masses.  EXTREMITIES:  Without edema.  Peripheral pulses are palpable.   LABORATORY DATA:  Full labs are not available yet from today.  Hemoglobin is 11.7 which is stable.  White count is normal, platelet  count is normal.  From January 6 her CMET showed mild hyponatremia,  mildly elevated alkaline phosphatase, otherwise unremarkable.  UA did  show moderate leukocytes, many bacteria, and 11-20 wbc's.  Imaging  studies.  A CT was done on January 7, early morning, with contrast which  showed some postoperative changes with no other acute findings.   ASSESSMENT AND PLAN:  This is a 50 year old Caucasian female with  medical problems as stated above who presents with nausea, vomiting, and  abdominal pain.  He was found to have a urinary tract infection on  January 6 but left against medical advice from the ED.  The patient has  a history of chronic pain in the past.  She is known to seek narcotic  drug agents because of her pain in the past as well.  She could be  having some pain from her urinary tract infection; however, I think that  her symptoms are exagerrated at this time, mostly functional with no  organic reasons.  However, she does have some nausea, vomiting, diarrhea  which could suggest gastroenteritis.   PLAN:  1. Acute gastroenteritis.  We will give her IV fluids, antiemetics,      put her on PPI, and give her some clear liquids.  We will await      improvement in symptoms.  If her diarrhea persists we will get      stool studies.  2. Urinary tract infection.  Will give her Levaquin and do a urine      culture sensitivity.  3. Abdominal pain.  I think that this is mostly functional, could be     related to urinary tract infection.  Will give her 1 dose of      Dilaudid and then treat her  with Tylenol.  I made it very clear to      the patient that we will not be prescribing her any narcotic agents      apart from this one-time dose.  4. Seizure  disorder.  We will check a Dilantin level.  Continue her      Dilantin for now.  5. Hypertension.  Continue lisinopril, hold off on the Lasix for now.  6. Deep venous thrombosis prophylaxis.   Further management decision will be based on results of initial testing  and patient's response to treatment.      Osvaldo Shipper, MD  Electronically Signed     GK/MEDQ  D:  12/07/2006  T:  12/07/2006  Job:  [id number]

## 2011-04-17 NOTE — Op Note (Signed)
NAME:  Danielle, [name]                ACCOUNT NO.:  [id number]   MEDICAL RECORD NO.:  [id number]          PATIENT TYPE:  AMB   LOCATION:  DAY                           FACILITY:  [facility]   PHYSICIAN:  Kassie Mends, M.D.      DATE OF BIRTH:  04/14/1961   DATE OF PROCEDURE:  06/30/2006  DATE OF DISCHARGE:                                 OPERATIVE REPORT   PROCEDURE:  Esophagogastroduodenoscopy.   MEDICATIONS:  1. Demerol 155 mg IV.  2. Versed 10 mg IV.  3. Phenergan 25 mg IV.  4. Diphenhydramine 25 mg IV.   INDICATIONS FOR EXAMINATION:  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  She  presents with persistent vomiting.   FINDINGS:  1. Normal Billroth II anatomy.  No evidence of inflammation or mass in the      gastric remnant.  2. Normal esophagus, without evidence of inflammation, mass, or Barrett's      esophagus.   RECOMMENDATIONS:  1. Continue gastroparesis diet.  2. Will obtain gastric emptying study.  If her gastric emptying is      delayed, then I would recommend the addition of metoclopramide.  She      may continue to use Phenergan as needed.  3. Recommend screening colonoscopy beginning at age 50.  The patient      should receive propofol for sedation.  4. She has a follow-up appointment to see me in the office.   PROCEDURAL TECHNIQUE:  Physical exam was performed and informed consent was  obtained from the patient after explaining all risks, benefits, and  alternatives to the procedure, which the patient appeared to understand and  so stated.  The patient was connected to the monitoring device and placed in  the left lateral position.  Continuous oxygen was provided by nasal cannula  and IV medicine administered through an indwelling cannula.  After  administration of sedation, the patient's esophagus was intubated and the  scope was advanced under direct  visualization to the afferent and efferent  limbs.  The scope was  subsequently removed slowly by carefully examining the color, texture,  anatomy, and integrity of the mucosa on the way out.  The patient was  recovered in the endoscopy suite and discharged to home in satisfactory  condition.      Kassie Mends, M.D.  Electronically Signed     SM/MEDQ  D:  07/01/2006  T:  07/01/2006  Job:  [id number]

## 2011-04-17 NOTE — Discharge Summary (Signed)
[name], [name]                ACCOUNT NO.:  [id number]   MEDICAL RECORD NO.:  [id number]          PATIENT TYPE:  INP   LOCATION:  5735                         FACILITY:  [facility]   PHYSICIAN:  Ileana Roup, M.D.  DATE OF BIRTH:  1961-05-24   DATE OF ADMISSION:  11/12/2004  DATE OF DISCHARGE:  11/16/2004                                 DISCHARGE SUMMARY   CONTINUATION:   PHYSICAL EXAMINATION:  VITAL SIGNS:  Heart rate 89, temperature 97 degrees,  respirations 22, blood pressure 185/117, O2 saturation 100% on room air.  GENERAL:  A 50 year old white lady lying in bed, in no apparent distress,  emotional, keeping her hand above the abdomen.  HEENT:  Eyes:  Pupils equal, round, reactive to light and accommodation.  Extraocular movements intact.  ENT:  Normal oropharynx mucosa.  NECK:  Supple, no thyromegaly, no lymphadenopathy.  LUNGS:  Decreased breath sounds on the right.  CARDIOVASCULAR:  A regular rate and rhythm.  No murmurs, no rubs, no  gallops.  ABDOMEN:  Bowel sounds decreased.  The abdomen is soft, tender all over,  especially in the epigastric area.  Romberg are negative.  No guarding.  EXTREMITIES:  No edema.  A petechial rash over both shins.  RECTAL:  Showed no hemorrhoids, no microscopic bleed.  SKIN:  Warm, with multiple scars on the arm, knee and big toe on the left,  and petechial rash over both shins.  NODES:  No lymphadenopathy.  MUSCULOSKELETAL:  Within normal limits.  NEUROLOGIC:  Cranial nerves II-XII  intact.  Strength is 5/5 bilaterally.  Deep tendon reflexes normal.  PSYCHOLOGICAL:  Oriented x3.   ADMISSION LABORATORY DATA:  Sodium 137, potassium 3.3, chloride 107,  bicarbonate 24, BUN 11, creatinine 0.8, glucose 80.  Hemoglobin 13.5,  hematocrit 38.1, MCV 85.4, white blood cells 6.7, platelets 231.  Anion gap  6, bilirubin 0.7, alkaline phosphatase 74, SGOT 29, SGPT 37, protein 7.4,  albumin 4.5.  Urinalysis showed clear urine, small leukocyte  esterase, urine  epithelials few and a few bacteria.  An abdominal x-ray showed minimal small bowel obstruction or ileus.  A chest x-ray with no acute findings.   HOSPITAL COURSE:  #1 - ABDOMINAL PAIN, NAUSEA, VOMITING, HEMATEMESIS, MOST  PROBABLY SECONDARY TO GASTRITIS:  A CT of the abdomen showed no modification  of chronic pancreatitis to the pancreas.  No ileus, even though ileus was  evidenced on the abdominal x-rays.  We also questioned abdominal pain,  nausea and vomiting as secondary to adhesions.  The patient has a history of  multiple surgeries.  A gastroenterology consultation was called during the  hospitalization.  Please see the dictated note.  Amylase and lipase were  normal on admission.  The patient underwent an esophagogastroduodenoscopy  which showed mild gastritis and Billroth-II, status post surgery.  We will  discharge the patient on Phenergan and also pain medication.  We will  closely monitor her as an outpatient.  Most likely her symptoms are related  to acute gastritis, even though dumping syndrome or small bowel obstruction  secondary to adhesions, cannot be ruled  out completely.  #2 - POLY-SUBSTANCE ABUSE:  The patient was positive for cocaine and also  for opiates on admission.  We advised her to quit using drugs, but she  denies any use.  Opiates can induce constipation and also abdominal pain can  be related chronic use.  #3 - DECREASED TSH, DECREASED FREE T4:  Will follow up as an outpatient. We  will repeat the tests.  If the TSH and free T4 are still decreased, we will  check a prolactin level for a pituitary adenoma.  #4 - HYPERTENSION:  During her hospitalization the patient had increased  blood pressures.  We started her on HCTZ 25 mg p.o. daily.  #5 - HYPERLIPIDEMIA:  We will follow up as an outpatient, and we will start  Zocor in the outpatient clinic, if the patient decides to have one of our  doctors as her primary care doctor.  #6 - HYPOKALEMIA:   This was interpreted as secondary to vomiting.  It was  repleted during hospitalization.  We will follow up as an outpatient with a  CMET.  #7 - INCREASED LIVER FUNCTION TESTS, MOST PROBABLY SECONDARY TO ALCOHOL USE:  During hospitalization they were normalized.  We will follow up as an  outpatient with a CMET.  The patient will be advised to eat small frequent meals at home, to avoid a  possible dumping syndrome.  #8 - DEPRESSION:  We will follow up as an outpatient, and if the patient  decides to have a continuity doctor in our clinic, consider starting her on  SSRI with close monitoring over the next several months.   PROPHYLAXIS IN THE HOSPITAL:  The patient was on PAS hose and on Protonix.   DISPOSITION/FOLLOWUP:  The patient is discharged on November 16, 2004.  We  will follow up with GI for HP testing, and we will call the patient with the  results and possible treatment.   DISCHARGE LABORATORY DATA:  Sodium 134, potassium 3.5, chloride 104,  bicarbonate 24, BUN 3, creatinine 0.6, glucose 73.  Hemoglobin 13.1,  hematocrit 38.0, white blood cells 6.7, platelets 234, MCV 86.4.       DA/MEDQ  D:  11/16/2004  T:  11/17/2004  Job:  [id number]

## 2011-04-17 NOTE — Op Note (Signed)
[name], [name]                ACCOUNT NO.:  [id number]   MEDICAL RECORD NO.:  [id number]          PATIENT TYPE:  AMB   LOCATION:  DAY                           FACILITY:  [facility]   PHYSICIAN:  Kassie Mends, M.D.      DATE OF BIRTH:  Jan 17, 1961   DATE OF PROCEDURE:  09/08/2006  DATE OF DISCHARGE:  09/08/2006                                 OPERATIVE REPORT   REFERRING PHYSICIAN:  Erle Crocker.   PROCEDURE:  Colonoscopy.   INDICATION FOR EXAM:  [name] is a 50 year old female with constipation  and rectal bleeding.   FINDINGS:  1. Normal colon.  No diverticula, polyps, masses, inflammatory changes or      vascular ectasia seen.  2. Normal retroflexed view of the rectum.   RECOMMENDATIONS:  1. Colace 100 mg b.i.d.  Fiber supplementation b.i.d.  High fiber diet.  2. Return patient visit in 2 months.  3. Screening colonoscopy in 10 years.   MEDICATIONS:  Propofol provided by anesthesia.   PROCEDURE TECHNIQUE:  Physical exam was performed and informed consent was  obtained from the patient after explaining the benefits, risks and  alternatives to the procedure.  The patient was connected to the monitor and  placed in the left lateral position.  Continuous oxygen was provided by  nasal cannula and IV medicine administered through an indwelling cannula.  After administration of sedation and rectal exam, the scope was advanced  under direct visualization to the cecum.  The scope was subsequently removed  slowly by carefully examining the color, texture, anatomy and integrity of  the mucosa on the way out.  The patient was recovered in the endoscopy suite  and discharged to home in satisfactory condition.      Kassie Mends, M.D.  Electronically Signed     SM/MEDQ  D:  09/08/2006  T:  09/10/2006  Job:  [id number]   cc:   Erle Crocker, M.D.

## 2011-04-17 NOTE — Op Note (Signed)
NAME:  Danielle, [name]:  [id number]   MEDICAL RECORD NO.:  [id number]          PATIENT TYPE:  INP   LOCATION:  5735                         FACILITY:  [facility]   PHYSICIAN:  John C. Madilyn Fireman, M.D.    DATE OF BIRTH:  05-17-1961   DATE OF PROCEDURE:  11/15/2004  DATE OF DISCHARGE:  11/16/2004                                 OPERATIVE REPORT   PROCEDURE:  Esophagogastroduodenoscopy with biopsy.   INDICATION FOR PROCEDURE:  Abdominal pain, nausea, vomiting, and diarrhea.   DESCRIPTION OF PROCEDURE:  The patient was placed in the left lateral  decubitus position and placed on the pulse monitor with continuous low-flow  oxygen delivered by nasal cannula.  She was sedated with 75 mcg IV fentanyl  and 6 mg IV Versed.  The Olympus video endoscope was advanced under direct  vision into the oropharynx and esophagus.  The esophagus was straight and of  normal caliber with the squamocolumnar line at 38 cm.  There was no visible  hiatal hernia, ring, stricture, or other abnormality at the GE junction.  The stomach was entered, and it was apparent that she had had a previous  gastric resection with what appeared to be approximately 50% gastrectomy  with a Billroth 1 anastomosis.  There was no stomal stenosis, no marginal  ulceration. There was a typical diffuse mild erythematous appearance of the  entire gastric pouch, most consistent with alkaline reflux gastritis.  The  single efferent duodenal limb was explored and appeared to be normal.  The  scope was withdrawn back into the stomach and CLOtest obtained.  The scope  was then withdrawn, and the patient returned to the recovery room in stable  condition.  She tolerated the procedure well, and there were no immediate  complications.   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   PLAN:  Consider trial of Carafate or Actigall and await CLOtest and treat  for eradication if Helicobacter  is positive.       JCH/MEDQ  D:  11/15/2004  T:  11/16/2004  Job:  [id number]   cc:   Augustina Mood, M.D.
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
## 2011-04-17 NOTE — H&P (Signed)
[name], [name]:  [id number]   MEDICAL RECORD NO.:  [id number]          PATIENT TYPE:  INP   LOCATION:  A318                          FACILITY:  [facility]   PHYSICIAN:  Osvaldo Shipper, MD     DATE OF BIRTH:  09-28-1961   DATE OF ADMISSION:  01/20/2005  DATE OF DISCHARGE:  02/21/2006LH                                HISTORY & PHYSICAL   This patient does not have a primary medical doctor.   ADMISSION DIAGNOSES:  1.  Acute gastroenteritis, likely viral.  2.  Hypertension.  3.  History of polysubstance abuse.  4.  Depression.   CHIEF COMPLAINT:  Nausea, vomiting, abdominal pain since early Sunday  morning, February 19.   HISTORY OF PRESENT ILLNESS:  The patient is a 50 year old white female with  history of hypertension, obesity, depression, past history of bleeding  gastric ulcer status post partial gastrectomy, who presented to the  emergency room with complaints of nausea, vomiting, abdominal pain, and  diarrhea which started early Sunday morning or late Saturday night.  The  patient said she was completely well last Thursday.  On Friday, she felt a  little bit unwell, is not able to fully describe her symptoms.  On Saturday,  she started getting headache followed by generalized body aches.  This was  followed by vomiting.  Initially she was vomiting food particles followed by  clear liquid and then followed by dry heaving.  She vomited about 10 times  on Sunday.  Following this, the patient had abdominal cramping in the upper  abdomen.  The pain was described as dull, pressure-like, and which remained  constant.  The patient then developed diarrhea initially loose consistency,  brown-colored stool, which then became watery, yellowish color, and she had  about 6 to 7 episodes on Monday.  At no point did she notice any frank blood  either in the vomitus or in her stool.  She thinks she might have vomited  some brownish colored liquid on Sunday.   The  patient also gives history of fever to about 101 on Sunday, and then on  Monday she had fever of about 102.  The patient gives history of headaches,  mostly in the front part of her head behind her eyes.  She mentioned some  sensitivity to light.  She also gives history of migraine headaches for the  past 15 years.  Following this headache, she developed generalized body  aches and fever as described above.  The patient did not have any history of  dysuria or hematuria.  No history of any back pain.  There is no history of  any sick contacts in the past few days.  She also does not give any history  of eating any unusual food in the past few days.  Nobody around her has  similar symptoms.   MEDICATIONS:  1.  Hydrochlorothiazide, unknown dose.  2.  Ambien 10 mg every night p.r.n.  3.  Tylenol on a p.r.n. basis.   ALLERGIES:  She is allergic to PENICILLIN which causes hives.  She is  allergic to ERYTHROMYCIN,  KEFLEX, and TETRACYCLINE which caused nausea,  vomiting, and abdominal pain.  She is allergic to [facility] which causes a  dystonic reaction.  She is allergic to TORADOL and TRAMADOL which cause  hives apparently.   PAST MEDICAL HISTORY:  1.  Hypertension for the past few months. The patient was admitted in      December 2005 to [facility] with similar complaints.  She      underwent an EGD over there on November 15, 2004, which showed mild      gastritis consistent with alkaline reflux with Billroth II anatomy.  2.  History of cholecystectomy at age of 35.  3.  History of partial gastrectomy because of bleeding ulcer back in 1998.  4.  History of tubal ligation in the past.  5.  History of exploratory laparotomy at which she underwent appendectomy.   SOCIAL HISTORY:  She lives with her boyfriend.  She is currently not  working.  She denies any tobacco use.  She denies any alcohol intake.  She  admits to having used cocaine about a month ago but nothing recently,. No   history of any IV drug use or any marijuana use.   FAMILY HISTORY:  Significant for father with coronary artery disease, CHF,  who died of lung cancer recently.  Mother has diabetes and some heart  problems she is not aware of.  Brother has hypertension and some heart  problem.  Again, she is not aware of what the heart condition is.   REVIEW OF SYSTEMS:  A 10-point Review of Systems was done which, apart from  eliciting positive findings under HPI, it was also positive for right hip  pain, bilateral knee pain.  Otherwise, negative.   PHYSICAL EXAMINATION:  VITAL SIGNS:  Temperature right now is 97; blood  pressure 134/91 sitting, 137/96 standing; pulse rate sitting and 101  standing; respiratory rate about 16 breaths per minute.  Pulse oximetry is  98% on room air.  GENERAL:  She is an obese white female in slight discomfort.  HEENT:  No pallor, no icterus was seen.  Oral mucous membranes were slightly  dry, but no oral lesions seen.  NECK:  Soft and supple.  LUNGS:  Clear to auscultation bilaterally with no rales, rhonchi, or  wheezing.  CARDIOVASCULAR:  S1 and S2 normal, slightly tachycardia.  Otherwise, no S3  or S4 were heard.  No murmurs appreciated.  No JVD was appreciated.  ABDOMEN:  Multiple scars seen on the abdomen from previous surgery.  Bowel  sounds were sluggish.  No organomegaly appreciated.  There was diffuse  tenderness all over the abdomen with no real focal point.  No rebound or  rigidity was appreciated.  RECTAL:  Exam was done which showed small yellow stool; otherwise no blood  was seen.  EXTREMITIES:  Without edema.  NEUROLOGIC:  Grossly normal.   LABORATORY DATA AND OTHER STUDIES:  CBC was done which showed a normal white  count, normal hemoglobin, normal platelet count.  A BMP was done which  showed sodium 135, potassium 3.1, chloride, bicarb, glucose, BUN, creatinine  all within normal limits. LFTs also within normal limits.  Amylase and lipase were 47  and 32, respectively, which were both within normal limits.  A UA was done which was also negative for UTI.   No imaging studies were done.   IMPRESSION:  This is a 49 year old white female with past medical history of  hypertension, distant history of bleeding peptic ulcer  for which she  underwent partial gastrectomy, presented with nausea, vomiting, abdominal  pain, and diarrhea for three days now. She also reported history of fever.  Her presenting symptoms and clinical status is consistent with acute  gastroenteritis.  She is also mildly dehydrated.   PLAN:  1.  Acute gastroenteritis.  We will start the patient on IV fluids.  We will      put her on antiemetics and analgesics to control her nausea and pain.      We will obtain a KUB to rule out any obstruction, though it is unlikely      in this patient.  Will start the patient on Protonix.  2.  Hypertension.  We will hold her antihypertensives until the patient is      adequately hydrated.  3.  We will reevaluate the patient in the morning after this initial      treatment, and further decisions will be made on the patient's progress.      GK/MEDQ  D:  01/20/2005  T:  01/20/2005  Job:  [id number]
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
## 2011-06-07 ENCOUNTER — Inpatient Hospital Stay (HOSPITAL_COMMUNITY)
Admission: EM | Admit: 2011-06-07 | Discharge: 2011-06-08 | DRG: 684 | Disposition: A | Discharge status: Home / Self Care | Payer: Medicare Other | Attending: Otolaryngology | Admitting: Otolaryngology

## 2011-06-07 ENCOUNTER — Emergency Department (HOSPITAL_COMMUNITY): Payer: Medicare Other

## 2011-06-07 ENCOUNTER — Encounter: Payer: Self-pay | Admitting: Emergency Medicine

## 2011-06-07 DIAGNOSIS — G40909 Epilepsy, unspecified, not intractable, without status epilepticus: Secondary | ICD-10-CM | POA: Diagnosis present

## 2011-06-07 DIAGNOSIS — R569 Unspecified convulsions: Secondary | ICD-10-CM | POA: Diagnosis present

## 2011-06-07 DIAGNOSIS — F319 Bipolar disorder, unspecified: Secondary | ICD-10-CM | POA: Diagnosis present

## 2011-06-07 DIAGNOSIS — E785 Hyperlipidemia, unspecified: Secondary | ICD-10-CM | POA: Diagnosis present

## 2011-06-07 DIAGNOSIS — I10 Essential (primary) hypertension: Secondary | ICD-10-CM | POA: Diagnosis present

## 2011-06-07 DIAGNOSIS — E039 Hypothyroidism, unspecified: Secondary | ICD-10-CM | POA: Diagnosis present

## 2011-06-07 DIAGNOSIS — E860 Dehydration: Secondary | ICD-10-CM | POA: Diagnosis present

## 2011-06-07 DIAGNOSIS — I9589 Other hypotension: Secondary | ICD-10-CM | POA: Diagnosis present

## 2011-06-07 DIAGNOSIS — G43909 Migraine, unspecified, not intractable, without status migrainosus: Secondary | ICD-10-CM | POA: Diagnosis present

## 2011-06-07 DIAGNOSIS — N179 Acute kidney failure, unspecified: Principal | ICD-10-CM | POA: Diagnosis present

## 2011-06-07 DIAGNOSIS — G8929 Other chronic pain: Secondary | ICD-10-CM | POA: Diagnosis present

## 2011-06-07 DIAGNOSIS — I959 Hypotension, unspecified: Secondary | ICD-10-CM | POA: Diagnosis present

## 2011-06-07 HISTORY — DX: Essential (primary) hypertension: I10

## 2011-06-07 HISTORY — DX: Major depressive disorder, single episode, unspecified: F32.9

## 2011-06-07 HISTORY — DX: Bipolar disorder, unspecified: F31.9

## 2011-06-07 HISTORY — DX: Depression, unspecified: F32.A

## 2011-06-07 HISTORY — DX: Headache: R51

## 2011-06-07 HISTORY — DX: Other chronic pain: G89.29

## 2011-06-07 HISTORY — DX: Unspecified convulsions: R56.9

## 2011-06-07 HISTORY — DX: Pure hypercholesterolemia, unspecified: E78.00

## 2011-06-07 HISTORY — DX: Gastro-esophageal reflux disease without esophagitis: K21.9

## 2011-06-07 HISTORY — DX: Gastric ulcer, unspecified as acute or chronic, without hemorrhage or perforation: K25.9

## 2011-06-07 HISTORY — DX: Anxiety disorder, unspecified: F41.9

## 2011-06-07 LAB — DIFFERENTIAL
Basophils Absolute: 0.1 10*3/uL (ref 0.0–0.1)
Basophils Relative: 1 % (ref 0–1)
Eosinophils Absolute: 0.3 10*3/uL (ref 0.0–0.7)
Monocytes Absolute: 1 10*3/uL (ref 0.1–1.0)
Monocytes Relative: 11 % (ref 3–12)
Neutro Abs: 5.3 10*3/uL (ref 1.7–7.7)

## 2011-06-07 LAB — BASIC METABOLIC PANEL
BUN: 38 mg/dL — ABNORMAL HIGH (ref 6–23)
Calcium: 10.1 mg/dL (ref 8.4–10.5)
Chloride: 96 mEq/L (ref 96–112)
Creatinine, Ser: 3.38 mg/dL — ABNORMAL HIGH (ref 0.50–1.10)
GFR calc Af Amer: 17 mL/min — ABNORMAL LOW (ref >60)
GFR calc non Af Amer: 14 mL/min — ABNORMAL LOW (ref >60)

## 2011-06-07 LAB — CBC
HCT: 35.2 % — ABNORMAL LOW (ref 36.0–46.0)
MCH: 29.5 pg (ref 26.0–34.0)
MCHC: 34.9 g/dL (ref 30.0–36.0)
RDW: 13.8 % (ref 11.5–15.5)

## 2011-06-07 LAB — ETHANOL: Alcohol, Ethyl (B): <11 mg/dL (ref 0–11)

## 2011-06-07 MED ORDER — SODIUM CHLORIDE 0.9 % IV SOLN
INTRAVENOUS | Status: DC
  Administered 2011-06-07: 15:00:00 via INTRAVENOUS

## 2011-06-07 MED ORDER — HYDROCODONE-ACETAMINOPHEN 5-325 MG PO TABS
1.0000 | ORAL_TABLET | Freq: Four times a day (QID) | ORAL | Status: DC | PRN
  Administered 2011-06-07 – 2011-06-08 (×4): 1 via ORAL
  Filled 2011-06-07 (×4): qty 1

## 2011-06-07 MED ORDER — ALPRAZOLAM 1 MG PO TABS
1.0000 mg | ORAL_TABLET | Freq: Four times a day (QID) | ORAL | Status: DC
  Administered 2011-06-07 – 2011-06-08 (×3): 1 mg via ORAL
  Filled 2011-06-07 (×3): qty 1

## 2011-06-07 MED ORDER — SODIUM CHLORIDE 0.9 % IV BOLUS (SEPSIS)
1000.0000 mL | Freq: Once | INTRAVENOUS | Status: AC
  Administered 2011-06-07: 1000 mL via INTRAVENOUS
  Filled 2011-06-07: qty 1000

## 2011-06-07 MED ORDER — ZOLPIDEM TARTRATE 5 MG PO TABS
10.0000 mg | ORAL_TABLET | Freq: Every evening | ORAL | Status: DC | PRN
  Administered 2011-06-07: 10 mg via ORAL
  Filled 2011-06-07: qty 2

## 2011-06-07 MED ORDER — ENOXAPARIN SODIUM 30 MG/0.3ML ~~LOC~~ SOLN
30.0000 mg | SUBCUTANEOUS | Status: DC
  Administered 2011-06-07: 30 mg via SUBCUTANEOUS
  Filled 2011-06-07: qty 0.3

## 2011-06-07 MED ORDER — LEVETIRACETAM 500 MG PO TABS
1000.0000 mg | ORAL_TABLET | Freq: Every day | ORAL | Status: DC
  Administered 2011-06-07: 1000 mg via ORAL
  Filled 2011-06-07: qty 2

## 2011-06-07 MED ORDER — FENTANYL CITRATE 0.05 MG/ML IJ SOLN: 50.0000 ug | Freq: Once | INTRAMUSCULAR | Status: DC

## 2011-06-07 MED ORDER — SODIUM CHLORIDE 0.9 % IV SOLN
INTRAVENOUS | Status: DC
  Administered 2011-06-07 – 2011-06-08 (×3): via INTRAVENOUS

## 2011-06-07 MED ORDER — ARIPIPRAZOLE 10 MG PO TABS
10.0000 mg | ORAL_TABLET | Freq: Every day | ORAL | Status: DC
  Administered 2011-06-07: 10 mg via ORAL
  Filled 2011-06-07 (×3): qty 1

## 2011-06-07 MED ORDER — DULOXETINE HCL 60 MG PO CPEP
60.0000 mg | ORAL_CAPSULE | Freq: Every day | ORAL | Status: DC
  Administered 2011-06-07 – 2011-06-08 (×2): 60 mg via ORAL
  Filled 2011-06-07 (×2): qty 1

## 2011-06-07 MED ORDER — PROMETHAZINE HCL 25 MG/ML IJ SOLN: 12.5000 mg | Freq: Four times a day (QID) | INTRAMUSCULAR | Status: DC | PRN

## 2011-06-07 MED ORDER — PROMETHAZINE HCL 12.5 MG PO TABS: 12.5000 mg | ORAL_TABLET | Freq: Four times a day (QID) | ORAL | Status: DC | PRN

## 2011-06-07 MED ORDER — PANTOPRAZOLE SODIUM 40 MG PO TBEC
40.0000 mg | DELAYED_RELEASE_TABLET | Freq: Every day | ORAL | Status: DC
  Administered 2011-06-07 – 2011-06-08 (×2): 40 mg via ORAL
  Filled 2011-06-07: qty 1

## 2011-06-07 MED ORDER — LEVETIRACETAM 500 MG PO TABS
500.0000 mg | ORAL_TABLET | ORAL | Status: DC
  Administered 2011-06-08: 500 mg via ORAL
  Filled 2011-06-07: qty 1

## 2011-06-07 MED ORDER — ACETAMINOPHEN 500 MG PO TABS
500.0000 mg | ORAL_TABLET | Freq: Four times a day (QID) | ORAL | Status: DC | PRN
  Administered 2011-06-08: 500 mg via ORAL
  Filled 2011-06-07: qty 1

## 2011-06-07 NOTE — ED Notes (Signed)
Pt in xray/ ct  

## 2011-06-07 NOTE — ED Notes (Signed)
Pt resting. stil requesting pain med. bp obtained and transported to xray/ct. Nad. No change.

## 2011-06-07 NOTE — ED Notes (Signed)
Iv attempt x 2. Another nurse in now. vo by dr Bernette Mayers to attempt foot also if needed.

## 2011-06-07 NOTE — ED Provider Notes (Addendum)
History     Chief Complaint  Patient presents with  . Headache   Patient is a 50 y.o. female presenting with headaches. The history is provided by the patient.  Headache  This is a new problem. Episode onset: 3 days ago  The problem occurs constantly. The problem has not changed since onset.The headache is associated with nothing. The quality of the pain is described as sharp. The pain is at a severity of 10/10. The pain is moderate. The pain does not radiate. Pertinent negatives include no fever, no chest pressure, no syncope, no shortness of breath, no nausea and no vomiting. She has tried oral narcotic analgesics for the symptoms. The treatment provided no relief.  Reports previous history of similar HAs previously. Patient also notes taking an additional Lebetalol either this AM or last night with HA worsening following the ingestion. States she is unsure why she took an additional Lebetalol but denies SI. Patient also reports having fallen 3x during the night after losing her balance while getting up to use the bathroom. Denies head injury and LOC.   Past Medical History  Diagnosis Date  . Hypertension   . Bipolar 1 disorder   . Depression   . Seizures   . Thyroid disease   . Hypercholesteremia   . Acid reflux   . Chronic pain   . Stomach ulcer     Past Surgical History  Procedure Date  . Cholecystectomy   . Tonsillectomy   . Tubal ligation   . Stomach surgery     History reviewed. No pertinent family history.  History  Substance Use Topics  . Smoking status: Never Smoker   . Smokeless tobacco: Not on file  . Alcohol Use: No    OB History    Grav Para Term Preterm Abortions TAB SAB Ect Mult Living                  Review of Systems  Constitutional: Negative for fever.  Eyes: Negative for photophobia and visual disturbance.  Respiratory: Negative for shortness of breath.   Cardiovascular: Negative for chest pain and syncope.  Gastrointestinal: Negative for  nausea and vomiting.  Neurological: Positive for headaches.  Psychiatric/Behavioral: Negative for suicidal ideas.  All other systems reviewed and are negative.    Physical Exam  BP 88/54  Pulse 81  Temp(Src) 97.9 F (36.6 C) (Oral)  Resp 20  Ht 5\' 4"  (1.626 m)  Wt 174 lb (78.926 kg)  BMI 29.87 kg/m2  SpO2 97%  Physical Exam  Nursing note and vitals reviewed. Constitutional: She appears well-developed and well-nourished.       Hypotensive.   HENT:  Head: Normocephalic and atraumatic.  Mouth/Throat: Uvula is midline, oropharynx is clear and moist and mucous membranes are normal.  Eyes: Conjunctivae and EOM are normal. Pupils are equal, round, and reactive to light. No scleral icterus.  Neck: Neck supple.  Cardiovascular: Normal rate, regular rhythm, normal heart sounds and intact distal pulses.        Hypotensive.   Pulmonary/Chest: Effort normal and breath sounds normal.  Abdominal: Soft. Bowel sounds are normal. She exhibits no distension. There is no tenderness.  Musculoskeletal: Normal range of motion.  Neurological: She is alert.  Skin: Skin is warm and dry.    ED Course  Procedures Recheck- 10:02AM, Patient explained lab results specifically Creatinine result and need for admit to hospital. Patient agrees with plan set forth at this time. BP also slightly improved at this time but  still hypotensive.   MDM Creatinine is elevated above baseline. She had admission in Jun2011 for similar symptoms. Creatine was 1.0 when she left (high of 6.2). Will readmit for hydration and treatment of renal failure.    I personally performed the services described in the documentation, which were scribed in my presence. The recorded information has been reviewed and considered.    Pt with multiple medical problems reports she fell three times during the night, losing balance while walking to the bathroom. States she accidentally took an extra labetolol this AM. Now feeling weak and  dizzy. BP is low, but HR is normal.   Date: 05/18/2011   Rate: 77  Rhythm: normal sinus rhythm  QRS Axis: normal  Intervals: normal  ST/T Wave abnormalities: normal  Conduction Disutrbances:none  Narrative Interpretation:   Old EKG Reviewed: Jun 16 , 2011, no change     Maleigha Colvard B. Bernette Mayers, MD 06/07/11 1120  Jourdyn Ferrin B. Bernette Mayers, MD 06/07/11 1122  Zuly Belkin B. Bernette Mayers, MD 06/07/11 1137

## 2011-06-07 NOTE — ED Notes (Signed)
edp aware of new bp. Pt requesting pain med but advised by myself and edp that narcotics would lower bp more. nad

## 2011-06-07 NOTE — ED Notes (Signed)
Patient knows we need urine sample - still cannot Use the bathroom.

## 2011-06-07 NOTE — ED Notes (Signed)
Report to Megan RN

## 2011-06-07 NOTE — H&P (Signed)
[name] is an 50 y.o. female.    Chief Complaint: Weak, dizzy, falls, headache, nausea, diarrhea  HPI:  Patient is a 50 year old white female who presents to the emergency room via EMS with complaints of dizziness and falls. She has been feeling very weak for the past few days. She feels dehydrated. Her appetite has been poor. She reports having nausea and diarrhea for the past several days. She has had no fevers chills diaphoresis. She's had no cough dysuria or blood in her stool. She reports that she gets nauseated frequently. She reports being on chronic opiates but denies being out of her medications. She reports a headache. She reports having fallen and hit her head several times she has a history of hypertension and thinks that she may have taken a double dose of her labetalol yesterday by accident. She has multiple admissions for acute renal failure which has resolved. She was found to be hypotensive in the emergency room with initially blood pressure in the 80s. After fluid bolus her blood pressure came up to the 90s. She also was found to be in renal failure. It appears that her last discharge creatinine a year ago was normal. She is currently requesting "IV pain medication", specifically. She is also requesting Phenergan despite having eaten most of her lunch tray. She has in her last H&P and discharge summary, a history of drug-seeking behavior. She has brought in all of her medications. However, she reports that she did not bring her hydrocodone or Xanax.  Past Medical History  Diagnosis Date  . Hypertension   . Bipolar 1 disorder   . Depression   . Seizures   . Thyroid disease   . Hypercholesteremia   . Acid reflux   . Chronic pain   . Stomach ulcer   . Headache   . Anxiety     Past Surgical History  Procedure Date  . Cholecystectomy   . Tonsillectomy   . Tubal ligation   . Stomach surgery     Family History  Problem Relation Age of Onset  . Hypertension Father   .  Diabetes type II Brother   . Hypertension Brother   . Diabetes type II Mother    Social History:  reports that she has never smoked. She does not have any smokeless tobacco history on file. She reports that she does not drink alcohol or use illicit drugs.  Medications:  See med rec.  Allergies:  Allergies  Allergen Reactions  . Cephalexin   . Erythromycin   . Metoclopramide Hcl   . Ondansetron Hcl   . Penicillins   . Sumatriptan   . Tetracycline   . Tramadol Hcl        Results for orders placed during the hospital encounter of 06/07/11 (from the past 48 hour(s))  CBC     Status: Abnormal   Collection Time   06/07/11  9:00 AM      Component Value Range Comment   WBC 9.8  4.0 - 10.5 (K/uL)    RBC 4.17  3.87 - 5.11 (MIL/uL)    Hemoglobin 12.3  12.0 - 15.0 (g/dL)    HCT 16.1 (*) 09.6 - 46.0 (%)    MCV 84.4  78.0 - 100.0 (fL)    MCH 29.5  26.0 - 34.0 (pg)    MCHC 34.9  30.0 - 36.0 (g/dL)    RDW 04.5  40.9 - 81.1 (%)    Platelets 241  150 - 400 (K/uL)  DIFFERENTIAL     Status: Normal   Collection Time   06/07/11  9:00 AM      Component Value Range Comment   Neutrophils Relative 55  43 - 77 (%)    Neutro Abs 5.3  1.7 - 7.7 (K/uL)    Lymphocytes Relative 32  12 - 46 (%)    Lymphs Abs 3.1  0.7 - 4.0 (K/uL)    Monocytes Relative 11  3 - 12 (%)    Monocytes Absolute 1.0  0.1 - 1.0 (K/uL)    Eosinophils Relative 3  0 - 5 (%)    Eosinophils Absolute 0.3  0.0 - 0.7 (K/uL)    Basophils Relative 1  0 - 1 (%)    Basophils Absolute 0.1  0.0 - 0.1 (K/uL)   BASIC METABOLIC PANEL     Status: Abnormal   Collection Time   06/07/11  9:00 AM      Component Value Range Comment   Sodium 133 (*) 135 - 145 (mEq/L)    Potassium 3.6  3.5 - 5.1 (mEq/L)    Chloride 96  96 - 112 (mEq/L)    CO2 20  19 - 32 (mEq/L)    Glucose, Bld 123 (*) 70 - 99 (mg/dL)    BUN 38 (*) 6 - 23 (mg/dL)    Creatinine, Ser 2.13 (*) 0.50 - 1.10 (mg/dL) **Please note change in reference range.**   Calcium 10.1  8.4 -  10.5 (mg/dL)    GFR calc non Af Amer 14 (*) >60 (mL/min)    GFR calc Af Amer 17 (*) >60 (mL/min)   ETHANOL     Status: Normal   Collection Time   06/07/11  9:00 AM      Component Value Range Comment   Alcohol, Ethyl (B) <11  0 - 11 (mg/dL)    No results found.  Review of Systems  Constitutional: Positive for malaise/fatigue. Negative for fever, chills, weight loss and diaphoresis.  HENT: Negative for hearing loss, ear pain, congestion, sore throat, neck pain and tinnitus.   Eyes: Negative.   Respiratory: Negative.   Cardiovascular: Negative.  Negative for chest pain and palpitations.  Gastrointestinal: Positive for nausea, vomiting and diarrhea. Negative for abdominal pain.  Genitourinary: Negative for dysuria, frequency and flank pain.       Oliguric for the past few days  Musculoskeletal: Positive for back pain and falls. Negative for myalgias.  Skin: Negative.   Neurological: Positive for dizziness, seizures (h/o, none recently), weakness and headaches. Negative for focal weakness.  Endo/Heme/Allergies: Negative.   Psychiatric/Behavioral: Positive for substance abuse (per previous dictations, h/o drug seeking behavior). The patient is nervous/anxious and has insomnia.        Bipolar stable  All other systems reviewed and are negative.    Blood pressure 104/62, pulse 76, temperature 97.7 F (36.5 C), temperature source Oral, resp. rate 19, height 5\' 4"  (1.626 m), weight 94.348 kg (208 lb), SpO2 98.00%. Physical Exam  Nursing note and vitals reviewed. Constitutional: She is oriented to person, place, and time. She appears well-developed and well-nourished. No distress.       Obese.  Lunch tray 75% eaten, despite pt's complaint of nausea, and request for phenergan  HENT:  Head: Normocephalic and atraumatic.  Nose: Nose normal.  Mouth/Throat: No oropharyngeal exudate.       Dry mucous membranes  Eyes: Conjunctivae are normal. Right eye exhibits no discharge. Left eye exhibits no  discharge. No scleral icterus.  Neck: Normal range  of motion. Neck supple. No JVD present. No thyromegaly present.  Cardiovascular: Normal rate, regular rhythm and normal heart sounds.  Exam reveals no gallop and no friction rub.   No murmur heard. Respiratory: Effort normal. No respiratory distress. She has no wheezes. She has rales.  GI: Soft. Bowel sounds are normal. She exhibits no distension. There is no tenderness.  Genitourinary:       deferred  Musculoskeletal: Normal range of motion. She exhibits no edema and no tenderness.  Lymphadenopathy:    She has no cervical adenopathy.  Neurological: She is alert and oriented to person, place, and time. She has normal reflexes. No cranial nerve deficit. Coordination normal.  Skin: Skin is warm and dry. No rash noted. She is not diaphoretic. No pallor.  Psychiatric: She has a normal mood and affect. Her behavior is normal. Judgment and thought content normal.     Assessment/Plan Principal Problem:  *Hypotension:  Improved after fluid bolus.  Due to dehydration in the setting of antihypertensives.  Continue IVF. Fall precautions. Monitor orthostatics and frequent vitals. Active Problems:  Acute renal failure:  Due to prerenal azotemia, most likely.  Has occurred multiple times on previous admission.  Continue IVF and monitor  Headache: appears chronic.  Takes no triptans.  HYPERTENSION: hold medications  SEIZURE DISORDER: Continue keppra  Bipolar disorder:  Continue outpt meds.  Chronic pain:  Watch for drug seeking, which pt has been documented to have in the past. Reported nausea:  I see that pt has eaten nearly all of her lunch tray.  Will order prn PO phenergan Reported diarrhea:  None today.  Could be opiate withdrawal, but pt denies being out of opiates.  Interestingly, pt has all of her medications with her, EXCEPT for opiates and benzos. Hypothyroidism:  Noncompliant with synthroid.  Will get TSH. Hyperlipidemia:  Noncompliant with  statin.  Will check FLP.   Boyd Litaker L 06/07/2011, 3:35 PM

## 2011-06-07 NOTE — Plan of Care (Signed)
Problem: Phase I Progression Outcomes Goal: Pain controlled with appropriate interventions Pt would rather have iv medications, but seems to get relief from po meds. Goal: OOB as tolerated unless otherwise ordered Pt ambulates in room encouraged to cal for assistance.

## 2011-06-07 NOTE — ED Notes (Addendum)
Headache x 2 days. Pt accidentally took too much of her labetolol this am. rx dose is 200 mg bid but took 400 mg this am. Pt is alert/oriented. Dizziness started early this am before meds taken. Pt states fell x 3 yesterday and hit head but denies LOC. Pupils dilated but perrla. nad at this time. nondiaphoretic.

## 2011-06-07 NOTE — ED Notes (Signed)
edp now in with pt. ems cbg 90. 12 lead in route NS

## 2011-06-07 NOTE — ED Notes (Signed)
[id number]<VW> Expected date:06/07/11<BR> Expected time: 7:16 AM<BR> Means of arrival:Ambulance<BR> Comments:<BR> Sick call

## 2011-06-08 LAB — LIPID PANEL
LDL Cholesterol: 95 mg/dL (ref 0–99)
Triglycerides: 231 mg/dL — ABNORMAL HIGH (ref <150)
VLDL: 46 mg/dL — ABNORMAL HIGH (ref 0–40)

## 2011-06-08 LAB — BASIC METABOLIC PANEL
BUN: 27 mg/dL — ABNORMAL HIGH (ref 6–23)
Calcium: 9.2 mg/dL (ref 8.4–10.5)
GFR calc non Af Amer: 52 mL/min — ABNORMAL LOW (ref >60)
Glucose, Bld: 102 mg/dL — ABNORMAL HIGH (ref 70–99)
Potassium: 3.5 mEq/L (ref 3.5–5.1)

## 2011-06-08 MED ORDER — CLONIDINE HCL 0.1 MG PO TABS: 0.1000 mg | ORAL_TABLET | Freq: Two times a day (BID) | ORAL | Status: DC

## 2011-06-08 MED ORDER — LABETALOL HCL 200 MG PO TABS: 200.0000 mg | ORAL_TABLET | Freq: Two times a day (BID) | ORAL | Status: DC

## 2011-06-08 MED ORDER — LISINOPRIL 5 MG PO TABS: 5.0000 mg | ORAL_TABLET | Freq: Every day | ORAL | Status: DC

## 2011-06-08 NOTE — Progress Notes (Signed)
D/c IV. Discharge instructions given and explained with understanding verbalized. Pt d/c'd home with NAD noted and in stable condition. Escorted out by staff via wheelchair to be taken home by private vehicle.

## 2011-06-08 NOTE — Discharge Summary (Signed)
Physician Discharge Summary  Patient ID: [name] MRN: [id number] DOB/AGE: 52-Jun-1962 52 y.o.  Admit date: 06/07/2011 Discharge date: 06/08/2011  Discharge Diagnoses:  Principal Problem:  *Hypotension Active Problems:  Acute renal failure  MIGRAINE HEADACHE  HYPERTENSION  SEIZURE DISORDER  Bipolar disorder  Chronic pain  Discharged Condition: stable  Hospital Course: The patient is a 50 year old white female who presents to the emergency room with weakness and several falls. She has not been eating well for the past few days. She's been nauseated and had diarrhea. She is currently complaining of headache which is chronic. She has a history of previous admission for acute renal failure secondary to prerenal azotemia. Her last creatinine was normal at the time of discharge. In the emergency room, she was found to be hypotensive in the 80s systolic. After fluid bolus her blood pressure normalized. She reported nausea but she was noted to be eating all of her diet. She also had drug seeking behavior which has been documented in the past. She reports that she did not run out of her opiates. However at the time of discharge, she requested a prescription for Percocet. I suspect her nausea and diarrhea may have been opiate withdrawal. I instructed her to followup with her primary care physician regarding pain medication. Her renal function normalized. Her blood pressure normalized. she's had no vomiting or diarrhea since she's been here. She is stable for discharge. Total time on the day of discharge was greater than 30 minutes.  Consults: none  Diet:  Low salt  ACTIVITY: Ad lib  Labs:  Admission on 06/07/2011  Component Date Value Range Status  . WBC (K/uL) 06/07/2011 9.8  4.0-10.5 Final  . RBC (MIL/uL) 06/07/2011 4.17  3.87-5.11 Final  . Hemoglobin (g/dL) 40/98/1191 47.8  29.5-62.1 Final  . HCT (%) 06/07/2011 35.2* 36.0-46.0 Final  . MCV (fL) 06/07/2011 84.4  78.0-100.0 Final  . MCH  (pg) 06/07/2011 29.5  26.0-34.0 Final  . MCHC (g/dL) 30/86/5784 69.6  29.5-28.4 Final  . RDW (%) 06/07/2011 13.8  11.5-15.5 Final  . Platelets (K/uL) 06/07/2011 241  150-400 Final  . Neutrophils Relative (%) 06/07/2011 55  43-77 Final  . Neutro Abs (K/uL) 06/07/2011 5.3  1.7-7.7 Final  . Lymphocytes Relative (%) 06/07/2011 32  12-46 Final  . Lymphs Abs (K/uL) 06/07/2011 3.1  0.7-4.0 Final  . Monocytes Relative (%) 06/07/2011 11  3-12 Final  . Monocytes Absolute (K/uL) 06/07/2011 1.0  0.1-1.0 Final  . Eosinophils Relative (%) 06/07/2011 3  0-5 Final  . Eosinophils Absolute (K/uL) 06/07/2011 0.3  0.0-0.7 Final  . Basophils Relative (%) 06/07/2011 1  0-1 Final  . Basophils Absolute (K/uL) 06/07/2011 0.1  0.0-0.1 Final  . Sodium (mEq/L) 06/07/2011 133* 135-145 Final  . Potassium (mEq/L) 06/07/2011 3.6  3.5-5.1 Final  . Chloride (mEq/L) 06/07/2011 96  96-112 Final  . CO2 (mEq/L) 06/07/2011 20  19-32 Final  . Glucose, Bld (mg/dL) 13/24/4010 272* 53-66 Final  . BUN (mg/dL) 44/01/4741 38* 5-95 Final  . Creatinine, Ser (mg/dL) 63/87/5643 3.29* 5.18-8.41 Final   **Please note change in reference range.**  . Calcium (mg/dL) 66/05/3015 01.0  9.3-23.5 Final  . GFR calc non Af Amer (mL/min) 06/07/2011 14* >60 Final  . GFR calc Af Amer (mL/min) 06/07/2011 17* >60 Final   Comment:                                 The eGFR has  been calculated                          using the MDRD equation.                          This calculation has not been                          validated in all clinical                          situations.                          eGFR's persistently                          <60 mL/min signify                          possible Chronic Kidney Disease.  Marland Kitchen Alcohol, Ethyl (B) (mg/dL) 16/08/9603 <54  0-98 Final   Comment:                                 LOWEST DETECTABLE LIMIT FOR                          SERUM ALCOHOL IS 11 mg/dL                          FOR MEDICAL PURPOSES  ONLY  . Sodium (mEq/L) 06/08/2011 138  135-145 Final  . Potassium (mEq/L) 06/08/2011 3.5  3.5-5.1 Final  . Chloride (mEq/L) 06/08/2011 105  96-112 Final   DELTA CHECK NOTED  . CO2 (mEq/L) 06/08/2011 23  19-32 Final  . Glucose, Bld (mg/dL) 11/91/4782 956* 21-30 Final  . BUN (mg/dL) 86/57/8469 27* 6-29 Final  . Creatinine, Ser (mg/dL) 52/84/1324 4.01  0.27-2.53 Final   **Please note change in reference range.**  . Calcium (mg/dL) 66/44/0347 9.2  4.2-59.5 Final  . GFR calc non Af Amer (mL/min) 06/08/2011 52* >60 Final  . GFR calc Af Amer (mL/min) 06/08/2011 >60  >60 Final   Comment:                                 The eGFR has been calculated                          using the MDRD equation.                          This calculation has not been                          validated in all clinical                          situations.                          eGFR's persistently                          <  60 mL/min signify                          possible Chronic Kidney Disease.   Significant Diagnostic Studies: Dg Chest 2 View  06/07/2011  *RADIOLOGY REPORT*  Clinical Data:  hypotension, headache  CHEST - 2 VIEW  Comparison: 05/15/10  Findings: Cardiac size is stable.  No acute infiltrate or pleural effusion.  No pulmonary edema.  Mild degenerative changes thoracic spine.  Multiple surgical clips in upper abdomen.  IMPRESSION: No active disease.  No significant change.  Original Report Authenticated By: Natasha Mead, M.D.   Ct Head Wo Contrast  06/07/2011  *RADIOLOGY REPORT*  Clinical Data:  Headache.  Dizziness.  Fall.  CT HEAD WITHOUT CONTRAST  Technique: Contiguous axial images were obtained from the base of the skull through the vertex without contrast  Comparison: 05/15/2010  Findings:  There is no evidence of intracranial hemorrhage, brain edema, or other signs of acute infarction.  There is no evidence of intracranial mass lesion or mass effect.  No abnormal extraaxial fluid collections are  identified.  There is no evidence of hydrocephalus, or other significant intracranial abnormality.  No skull abnormality identified.  IMPRESSION: Negative non-contrast head CT.  Original Report Authenticated By: Danae Orleans, M.D.   Discharge Exam: Blood pressure 140/88, pulse 78, temperature 97.5 F (36.4 C), temperature source Oral, resp. rate 18, height 5\' 4"  (1.626 m), weight 94.348 kg (208 lb), SpO2 95.00%.  Discharge Orders    Future Orders Please Complete By Expires   Diet - low sodium heart healthy      Increase activity slowly        Current Discharge Medication List    CONTINUE these medications which have CHANGED   Details  cloNIDine (CATAPRES) 0.1 MG tablet Take 1 tablet (0.1 mg total) by mouth 2 (two) times daily. Qty: 1 tablet, Refills: 0    labetalol (NORMODYNE) 200 MG tablet Take 1 tablet (200 mg total) by mouth 2 (two) times daily. Qty: 1 tablet, Refills: 0    lisinopril (PRINIVIL,ZESTRIL) 5 MG tablet Take 1 tablet (5 mg total) by mouth daily. Qty: 1 tablet, Refills: 0      CONTINUE these medications which have NOT CHANGED   Details  acetaminophen (TYLENOL) 500 MG tablet Take 500 mg by mouth every 6 (six) hours as needed.      ALPRAZolam (XANAX) 1 MG tablet Take 1 mg by mouth 4 (four) times daily.     ARIPiprazole (ABILIFY) 10 MG tablet Take 10 mg by mouth at bedtime.     DULoxetine (CYMBALTA) 60 MG capsule Take 60 mg by mouth daily.     ferrous gluconate (FERGON) 325 MG tablet Take 325 mg by mouth daily with breakfast.     ferrous sulfate ([employer] IRON) 325 (65 FE) MG tablet Take 325 mg by mouth daily.      HYDROcodone-acetaminophen (VICODIN) 5-500 MG per tablet Take 1 tablet by mouth 2 (two) times daily as needed. Spurs in neck and pain    !! levETIRAcetam (KEPPRA) 1000 MG tablet Take 1,000 mg by mouth at bedtime.     !! levETIRAcetam (KEPPRA) 500 MG tablet Take 500 mg by mouth every morning.     levothyroxine (SYNTHROID, LEVOTHROID) 25 MCG tablet Take 25  mcg by mouth daily. Patient states she has never taken this med until a couple of days ago because she doesn't think there is anything wrong with her thyroid    !!  lubiprostone (AMITIZA) 8 MCG capsule Take 8 mcg by mouth 2 (two) times daily with a meal.     !! lubiprostone (AMITIZA) 8 MCG capsule Take 8 mcg by mouth 2 (two) times daily.      omeprazole (PRILOSEC) 40 MG capsule Take 40 mg by mouth daily.     ZOLPIDEM TARTRATE PO Take 10 mg by mouth at bedtime.     simvastatin (ZOCOR) 20 MG tablet Take 20 mg by mouth at bedtime. Does not take med as she says she doesn't have anything wrong with her cholesterol     !! - Potential duplicate medications found. Please discuss with provider.     Follow-up Information    Follow up with TAPPER,DAVID B in 1 week.   Contact information:   [address] [place] [address] [phone]          Signed: Christiane Ha 06/08/2011, 2:08 PM

## 2011-06-09 DIAGNOSIS — F319 Bipolar disorder, unspecified: Secondary | ICD-10-CM | POA: Diagnosis present

## 2011-08-20 LAB — URINALYSIS, ROUTINE W REFLEX MICROSCOPIC
Glucose, UA: NEGATIVE
Specific Gravity, Urine: 1.025
pH: 5.5

## 2011-08-20 LAB — URINE MICROSCOPIC-ADD ON

## 2011-08-31 LAB — BASIC METABOLIC PANEL
Calcium: 8.8
Creatinine, Ser: 0.7
GFR calc Af Amer: >60
GFR calc non Af Amer: >60
Sodium: 137

## 2011-08-31 LAB — DIFFERENTIAL
Basophils Absolute: 0.1
Basophils Relative: 1
Lymphocytes Relative: 42
Monocytes Absolute: 0.7
Monocytes Relative: 7
Neutro Abs: 4.7
Neutrophils Relative %: 49

## 2011-08-31 LAB — CBC
Hemoglobin: 10.6 — ABNORMAL LOW
RBC: 3.71 — ABNORMAL LOW

## 2011-09-04 LAB — URINALYSIS, ROUTINE W REFLEX MICROSCOPIC
Hgb urine dipstick: NEGATIVE
Protein, ur: NEGATIVE
Specific Gravity, Urine: 1.01
Urobilinogen, UA: 0.2

## 2011-09-04 LAB — URINE MICROSCOPIC-ADD ON

## 2011-09-04 LAB — URINE CULTURE: Colony Count: >=100000

## 2011-09-07 LAB — BASIC METABOLIC PANEL
CO2: 28
Chloride: 103
GFR calc Af Amer: >60
Potassium: 3.3 — ABNORMAL LOW
Sodium: 138

## 2011-09-07 LAB — DIFFERENTIAL
Basophils Relative: 1
Eosinophils Absolute: 0.1 — ABNORMAL LOW
Eosinophils Relative: 1
Lymphs Abs: 3
Monocytes Absolute: 0.5
Monocytes Relative: 7
Neutrophils Relative %: 52

## 2011-09-07 LAB — CBC
HCT: 35.7 — ABNORMAL LOW
Hemoglobin: 12.3
MCHC: 34.5
MCV: 83.1
RBC: 4.3
WBC: 7.5

## 2011-09-11 ENCOUNTER — Encounter (HOSPITAL_COMMUNITY): Payer: Self-pay | Admitting: *Deleted

## 2011-09-11 ENCOUNTER — Emergency Department (HOSPITAL_COMMUNITY)
Admission: EM | Admit: 2011-09-11 | Discharge: 2011-09-11 | Disposition: A | Discharge status: Home / Self Care | Payer: Medicare Other | Attending: Emergency Medicine | Admitting: Emergency Medicine

## 2011-09-11 DIAGNOSIS — Z79899 Other long term (current) drug therapy: Secondary | ICD-10-CM | POA: Insufficient documentation

## 2011-09-11 DIAGNOSIS — M545 Low back pain, unspecified: Secondary | ICD-10-CM | POA: Insufficient documentation

## 2011-09-11 DIAGNOSIS — F319 Bipolar disorder, unspecified: Secondary | ICD-10-CM | POA: Insufficient documentation

## 2011-09-11 DIAGNOSIS — Y92009 Unspecified place in unspecified non-institutional (private) residence as the place of occurrence of the external cause: Secondary | ICD-10-CM | POA: Insufficient documentation

## 2011-09-11 DIAGNOSIS — E079 Disorder of thyroid, unspecified: Secondary | ICD-10-CM | POA: Insufficient documentation

## 2011-09-11 DIAGNOSIS — G8929 Other chronic pain: Secondary | ICD-10-CM | POA: Insufficient documentation

## 2011-09-11 DIAGNOSIS — I10 Essential (primary) hypertension: Secondary | ICD-10-CM | POA: Insufficient documentation

## 2011-09-11 DIAGNOSIS — R10819 Abdominal tenderness, unspecified site: Secondary | ICD-10-CM | POA: Insufficient documentation

## 2011-09-11 DIAGNOSIS — F411 Generalized anxiety disorder: Secondary | ICD-10-CM | POA: Insufficient documentation

## 2011-09-11 DIAGNOSIS — E7800 Pure hypercholesterolemia, unspecified: Secondary | ICD-10-CM | POA: Insufficient documentation

## 2011-09-11 DIAGNOSIS — W010XXA Fall on same level from slipping, tripping and stumbling without subsequent striking against object, initial encounter: Secondary | ICD-10-CM | POA: Insufficient documentation

## 2011-09-11 DIAGNOSIS — R569 Unspecified convulsions: Secondary | ICD-10-CM | POA: Insufficient documentation

## 2011-09-11 DIAGNOSIS — M549 Dorsalgia, unspecified: Secondary | ICD-10-CM

## 2011-09-11 LAB — DIFFERENTIAL
Basophils Absolute: 0 10*3/uL (ref 0.0–0.1)
Basophils Relative: 1
Eosinophils Absolute: 0.2 10*3/uL (ref 0.0–0.7)
Eosinophils Relative: 1
Lymphocytes Relative: 40 % (ref 12–46)
Lymphs Abs: 2.4
Lymphs Abs: 2.5 10*3/uL (ref 0.7–4.0)
Monocytes Relative: 7
Neutro Abs: 4.1
Neutrophils Relative %: 48 % (ref 43–77)

## 2011-09-11 LAB — URINALYSIS, ROUTINE W REFLEX MICROSCOPIC
Bilirubin Urine: NEGATIVE
Glucose, UA: NEGATIVE
Glucose, UA: NEGATIVE mg/dL
Hgb urine dipstick: NEGATIVE
Hgb urine dipstick: NEGATIVE
Ketones, ur: NEGATIVE mg/dL
Leukocytes, UA: NEGATIVE
Nitrite: NEGATIVE
Specific Gravity, Urine: 1.025
pH: 6
pH: 6.5 (ref 5.0–8.0)

## 2011-09-11 LAB — CBC
MCHC: 33.8
MCV: 82
Platelets: 298
Platelets: 240 10*3/uL (ref 150–400)
RBC: 4.27 MIL/uL (ref 3.87–5.11)
RDW: 13.5 % (ref 11.5–15.5)
WBC: 7.2
WBC: 6.4 10*3/uL (ref 4.0–10.5)

## 2011-09-11 LAB — BASIC METABOLIC PANEL
GFR calc non Af Amer: >90 mL/min (ref >90)
Glucose, Bld: 99 mg/dL (ref 70–99)
Potassium: 3.8 mEq/L (ref 3.5–5.1)
Sodium: 139 mEq/L (ref 135–145)

## 2011-09-11 LAB — I-STAT 8, (EC8 V) (CONVERTED LAB)
Acid-Base Excess: 2
Bicarbonate: 25.6 — ABNORMAL HIGH
Glucose, Bld: 81
HCT: 36
Hemoglobin: 12.2
Operator id: 221061
Potassium: 3.8
Sodium: 139
TCO2: 27

## 2011-09-11 LAB — URINE CULTURE: Colony Count: 6000

## 2011-09-11 LAB — URINE MICROSCOPIC-ADD ON

## 2011-09-11 LAB — COMPREHENSIVE METABOLIC PANEL
AST: 17
Albumin: 4
Calcium: 9.2
Chloride: 106
Creatinine, Ser: 0.73
GFR calc Af Amer: >60
Sodium: 143

## 2011-09-11 LAB — RAPID URINE DRUG SCREEN, HOSP PERFORMED
Barbiturates: NOT DETECTED
Opiates: NOT DETECTED
Tetrahydrocannabinol: NOT DETECTED

## 2011-09-11 MED ORDER — OXYCODONE-ACETAMINOPHEN 5-325 MG PO TABS: 1.0000 | ORAL_TABLET | ORAL | Status: AC | PRN

## 2011-09-11 MED ORDER — PROMETHAZINE HCL 25 MG/ML IJ SOLN
12.5000 mg | Freq: Once | INTRAMUSCULAR | Status: AC
  Administered 2011-09-11: 12.5 mg via INTRAVENOUS
  Filled 2011-09-11: qty 1

## 2011-09-11 MED ORDER — SODIUM CHLORIDE 0.9 % IV BOLUS (SEPSIS)
500.0000 mL | Freq: Once | INTRAVENOUS | Status: AC
  Administered 2011-09-11: 11:00:00 via INTRAVENOUS

## 2011-09-11 MED ORDER — SODIUM CHLORIDE 0.9 % IV SOLN: INTRAVENOUS | Status: DC

## 2011-09-11 MED ORDER — HYDROMORPHONE HCL 1 MG/ML IJ SOLN
1.0000 mg | Freq: Once | INTRAMUSCULAR | Status: AC
  Administered 2011-09-11: 1 mg via INTRAVENOUS
  Filled 2011-09-11: qty 1

## 2011-09-11 NOTE — ED Provider Notes (Signed)
History   This chart was scribed for Flint Melter, MD by [name]. The patient was seen in room APA01/APA01 and the patient's care was started at 10:18AM.  CSN: [id number] Arrival date & time: 09/11/2011  9:43 AM  Chief Complaint  Patient presents with  . Lower back pain    (Consider location/radiation/quality/duration/timing/severity/associated sxs/prior treatment) HPI [name] is a 50 y.o. female who presents to the Emergency Department complaining of constant left lower back pain onset several days ago and persistent since which was worsened this morning after tripping over her pajama pants and falling which caused her back to "twist" and aggravated back pain. Patient reports back pain is currently aggravated by movement. Patient also notes experiencing intermittent left flank pain for the past several weeks with associated foul smelling urine, decreased frequency of urination, chills, nausea and vomiting. Notes experiencing previous symptoms previously and reports having chronic history of pyelonephritis with last episode occurring several weeks ago.  Patient with h/o hypertension, hypercholesterolemia, anxiety, bipolar 1 disorder and chronic pain.   Past Medical History  Diagnosis Date  . Hypertension   . Bipolar 1 disorder   . Depression   . Seizures   . Thyroid disease   . Hypercholesteremia   . Acid reflux   . Chronic pain   . Stomach ulcer   . Headache   . Anxiety     Past Surgical History  Procedure Date  . Cholecystectomy   . Tonsillectomy   . Stomach surgery     Family History  Problem Relation Age of Onset  . Hypertension Father   . Diabetes type II Brother   . Hypertension Brother   . Diabetes type II Mother     History  Substance Use Topics  . Smoking status: Never Smoker   . Smokeless tobacco: Not on file  . Alcohol Use: No    OB History    Grav Para Term Preterm Abortions TAB SAB Ect Mult Living                  Review of  Systems 10 Systems reviewed and are negative for acute change except as noted in the HPI.  Allergies  Cephalexin; Erythromycin; Metoclopramide hcl; Ondansetron hcl; Penicillins; Sumatriptan; Tetracycline; and Tramadol hcl  Home Medications   Current Outpatient Rx  Name Route Sig Dispense Refill  . ACETAMINOPHEN 500 MG PO TABS Oral Take 500 mg by mouth every 6 (six) hours as needed. pain    . ALPRAZOLAM 1 MG PO TABS Oral Take 1 mg by mouth 4 (four) times daily.     . ARIPIPRAZOLE 10 MG PO TABS Oral Take 10 mg by mouth at bedtime.     Marland Kitchen CLONIDINE HCL 0.1 MG PO TABS Oral Take 1 tablet (0.1 mg total) by mouth 2 (two) times daily. 1 tablet 0    Hold for 1 week then resume  . DULOXETINE HCL 60 MG PO CPEP Oral Take 60 mg by mouth daily.     Marland Kitchen HYDROCODONE-ACETAMINOPHEN 5-500 MG PO TABS Oral Take 1 tablet by mouth 2 (two) times daily as needed. Spurs in neck and pain    . LABETALOL HCL 200 MG PO TABS Oral Take 1 tablet (200 mg total) by mouth 2 (two) times daily. 1 tablet 0    Hold for 1 week then resume  . LEVETIRACETAM 1000 MG PO TABS Oral Take 1,000 mg by mouth at bedtime.     Marland Kitchen LEVETIRACETAM 500 MG PO TABS Oral  Take 500 mg by mouth every morning.     Marland Kitchen LEVOTHYROXINE SODIUM 25 MCG PO TABS Oral Take 25 mcg by mouth daily.     Marland Kitchen LISINOPRIL 5 MG PO TABS Oral Take 1 tablet (5 mg total) by mouth daily. 1 tablet 0    Hold for a week then resume  . LUBIPROSTONE 8 MCG PO CAPS Oral Take 8 mcg by mouth 2 (two) times daily with a meal.     . OMEPRAZOLE 40 MG PO CPDR Oral Take 40 mg by mouth daily.     Marland Kitchen SIMVASTATIN 20 MG PO TABS Oral Take 20 mg by mouth at bedtime.     Marland Kitchen ZOLPIDEM TARTRATE PO Oral Take 10 mg by mouth at bedtime.     Marland Kitchen FERROUS GLUCONATE 325 MG PO TABS Oral Take 325 mg by mouth daily with breakfast.     . FERROUS SULFATE 325 (65 FE) MG PO TABS Oral Take 325 mg by mouth daily.      . LUBIPROSTONE 8 MCG PO CAPS Oral Take 8 mcg by mouth 2 (two) times daily.       BP 137/81  Pulse 88   Temp(Src) 98.8 F (37.1 C) (Oral)  Resp 18  Ht 5' 4.5" (1.638 m)  Wt 200 lb (90.719 kg)  BMI 33.80 kg/m2  SpO2 96%  Physical Exam  Nursing note and vitals reviewed. Constitutional: She is oriented to person, place, and time. She appears well-developed and well-nourished. No distress.  HENT:  Head: Normocephalic and atraumatic.  Mouth/Throat: Oropharynx is clear and moist.  Eyes: EOM are normal. Pupils are equal, round, and reactive to light.  Neck: Neck supple. No tracheal deviation present.  Cardiovascular: Normal rate and regular rhythm.  Exam reveals no friction rub.   No murmur heard. Pulmonary/Chest: Effort normal. No respiratory distress. She has no wheezes. She has no rales.  Abdominal: Soft. She exhibits no distension. There is tenderness.       Mild left flank tenderness. Left periumbilical tenderness.   Musculoskeletal: Normal range of motion. She exhibits no edema.       Entire spine non-tender. Localizes pain to left lumbar region.   Neurological: She is alert and oriented to person, place, and time. No sensory deficit.  Skin: Skin is warm and dry.  Psychiatric: She has a normal mood and affect. Her behavior is normal.    ED Course  Procedures (including critical care time)  DIAGNOSTIC STUDIES: Oxygen Saturation is 96% on room air, normal by my interpretation.    COORDINATION OF CARE: 3:10PM- Patient reports pain was improved after administration of 1mg  Dilaudid. Intent to d/c home with prescription for pain management. Patient advised not to operate motor vehicle for 6 hours following administration of 1mg -Dilaudid. Patient agrees with plan set forth at this time.     Labs Reviewed  URINALYSIS, ROUTINE W REFLEX MICROSCOPIC  CBC  DIFFERENTIAL  BASIC METABOLIC PANEL  URINE CULTURE      MDM  Nonspecific low back pain and urinary complaints. Evaluation ED is negative for acute emergent medical problems. Doubt compressive myelopathy,  urinary tract infection,  fracture. Patient has a history of chronic pain, which is not currently being treated. Patient became quite sedated after medication and in the ED and then stated she wanted to drive home.    Plan: Patient is instructed to not drive her vehicle until 4:09 PM which is 6 hours after her Dilaudid and Phenergan dosing.   I personally performed the services described  in this documentation, which was scribed in my presence. The recorded information has been reviewed and considered.      Flint Melter, MD 09/11/11 [phone]

## 2011-09-11 NOTE — ED Notes (Signed)
Pt sleeping, will arouse, states that the pain and nausea are better

## 2011-09-11 NOTE — ED Notes (Signed)
Pt states she fell this morning and is having lower back pain. Pt also states foul odor to urine x several days. Tingling down left leg. Pt also states mouth has been twitching x ~ 1 mo. NAD

## 2011-09-11 NOTE — ED Notes (Signed)
In & out cath clear yellow return. Patient tolerated well

## 2011-09-11 NOTE — ED Notes (Signed)
Patient was assisted to the bathroom tolerated well.

## 2011-09-11 NOTE — ED Notes (Signed)
Pt assisted to bathroom, states that her headache is worse, pain is rated as a 9 on pain scale, pt unsteady on her feet, was taken to bathroom in wheelchair.

## 2011-09-11 NOTE — ED Notes (Signed)
Pt requesting something for nausea, EDP notified

## 2011-09-11 NOTE — ED Notes (Signed)
Dr. Effie Shy notified of pain request, advised he would need to re-assess pt prior to giving additional pain medication,

## 2011-09-11 NOTE — ED Notes (Signed)
Patient is with the nurse. Patient states she does not need anything at this time.

## 2011-09-11 NOTE — ED Notes (Signed)
Pt states that she is driving home, pt advised that she is not allowed to drive due to having narcotic pain medication, pt states that she understands and will wait until her 6 hrs after receiving the dilaudid are up.

## 2011-09-11 NOTE — ED Notes (Signed)
Pt instructed again on not to drive until after 9:14NW today,

## 2011-09-12 LAB — URINE CULTURE
Colony Count: NO GROWTH
Culture  Setup Time: 201210121358
Culture: NO GROWTH

## 2011-09-14 LAB — HEPATIC FUNCTION PANEL
ALT: 42 — ABNORMAL HIGH
Albumin: 3.5
Alkaline Phosphatase: 127 — ABNORMAL HIGH
Total Protein: 6.1

## 2011-09-14 LAB — DIFFERENTIAL
Basophils Absolute: 0
Eosinophils Relative: 1
Lymphocytes Relative: 33
Lymphs Abs: 2.5
Neutro Abs: 4.4
Neutrophils Relative %: 57

## 2011-09-14 LAB — BASIC METABOLIC PANEL
BUN: 13
Calcium: 8.3 — ABNORMAL LOW
Creatinine, Ser: 0.54
GFR calc non Af Amer: >60
Glucose, Bld: 88
Potassium: 3.8

## 2011-09-14 LAB — CBC
Platelets: 229
RDW: 15.6 — ABNORMAL HIGH
WBC: 7.6

## 2011-11-10 ENCOUNTER — Emergency Department (HOSPITAL_COMMUNITY): Payer: Medicare Other

## 2011-11-10 ENCOUNTER — Encounter (HOSPITAL_COMMUNITY): Payer: Self-pay | Admitting: Radiology

## 2011-11-10 ENCOUNTER — Emergency Department (HOSPITAL_COMMUNITY)
Admission: EM | Admit: 2011-11-10 | Discharge: 2011-11-10 | Disposition: A | Discharge status: Home / Self Care | Payer: Medicare Other | Attending: Emergency Medicine | Admitting: Emergency Medicine

## 2011-11-10 DIAGNOSIS — K219 Gastro-esophageal reflux disease without esophagitis: Secondary | ICD-10-CM | POA: Insufficient documentation

## 2011-11-10 DIAGNOSIS — R569 Unspecified convulsions: Secondary | ICD-10-CM | POA: Insufficient documentation

## 2011-11-10 DIAGNOSIS — E7800 Pure hypercholesterolemia, unspecified: Secondary | ICD-10-CM | POA: Insufficient documentation

## 2011-11-10 DIAGNOSIS — I10 Essential (primary) hypertension: Secondary | ICD-10-CM | POA: Insufficient documentation

## 2011-11-10 DIAGNOSIS — G8929 Other chronic pain: Secondary | ICD-10-CM | POA: Insufficient documentation

## 2011-11-10 DIAGNOSIS — F319 Bipolar disorder, unspecified: Secondary | ICD-10-CM | POA: Insufficient documentation

## 2011-11-10 DIAGNOSIS — R109 Unspecified abdominal pain: Secondary | ICD-10-CM | POA: Insufficient documentation

## 2011-11-10 DIAGNOSIS — K529 Noninfective gastroenteritis and colitis, unspecified: Secondary | ICD-10-CM

## 2011-11-10 DIAGNOSIS — F411 Generalized anxiety disorder: Secondary | ICD-10-CM | POA: Insufficient documentation

## 2011-11-10 DIAGNOSIS — K5289 Other specified noninfective gastroenteritis and colitis: Secondary | ICD-10-CM | POA: Insufficient documentation

## 2011-11-10 DIAGNOSIS — E079 Disorder of thyroid, unspecified: Secondary | ICD-10-CM | POA: Insufficient documentation

## 2011-11-10 DIAGNOSIS — K5000 Crohn's disease of small intestine without complications: Secondary | ICD-10-CM

## 2011-11-10 LAB — URINALYSIS, ROUTINE W REFLEX MICROSCOPIC
Glucose, UA: NEGATIVE mg/dL
Hgb urine dipstick: NEGATIVE
Leukocytes, UA: NEGATIVE
Nitrite: NEGATIVE
Protein, ur: NEGATIVE mg/dL
Specific Gravity, Urine: >1.03 — ABNORMAL HIGH (ref 1.005–1.030)
Urobilinogen, UA: 0.2 mg/dL (ref 0.0–1.0)
pH: 5.5 (ref 5.0–8.0)

## 2011-11-10 LAB — BASIC METABOLIC PANEL
BUN: 15 mg/dL (ref 6–23)
CO2: 25 mEq/L (ref 19–32)
Calcium: 10.1 mg/dL (ref 8.4–10.5)
Chloride: 101 mEq/L (ref 96–112)
Creatinine, Ser: 0.86 mg/dL (ref 0.50–1.10)
GFR calc Af Amer: 88 mL/min — ABNORMAL LOW (ref >90)
GFR calc non Af Amer: 76 mL/min — ABNORMAL LOW (ref >90)
Glucose, Bld: 103 mg/dL — ABNORMAL HIGH (ref 70–99)
Potassium: 3.7 mEq/L (ref 3.5–5.1)
Sodium: 138 mEq/L (ref 135–145)

## 2011-11-10 LAB — CBC
HCT: 35 % — ABNORMAL LOW (ref 36.0–46.0)
Hemoglobin: 11.8 g/dL — ABNORMAL LOW (ref 12.0–15.0)
MCH: 28.6 pg (ref 26.0–34.0)
MCHC: 33.7 g/dL (ref 30.0–36.0)
MCV: 85 fL (ref 78.0–100.0)
Platelets: 273 10*3/uL (ref 150–400)
RBC: 4.12 MIL/uL (ref 3.87–5.11)
RDW: 13.6 % (ref 11.5–15.5)
WBC: 9.3 10*3/uL (ref 4.0–10.5)

## 2011-11-10 LAB — PREGNANCY, URINE: Preg Test, Ur: NEGATIVE

## 2011-11-10 MED ORDER — PREDNISONE 20 MG PO TABS: 60.0000 mg | ORAL_TABLET | Freq: Once | ORAL | Status: DC

## 2011-11-10 MED ORDER — SODIUM CHLORIDE 0.9 % IV BOLUS (SEPSIS)
1000.0000 mL | Freq: Once | INTRAVENOUS | Status: AC
  Administered 2011-11-10: 1000 mL via INTRAVENOUS

## 2011-11-10 MED ORDER — PREDNISONE 20 MG PO TABS
60.0000 mg | ORAL_TABLET | Freq: Once | ORAL | Status: AC
  Administered 2011-11-10: 60 mg via ORAL
  Filled 2011-11-10: qty 3

## 2011-11-10 MED ORDER — PREDNISONE 20 MG PO TABS: 40.0000 mg | ORAL_TABLET | Freq: Every day | ORAL | Status: DC

## 2011-11-10 MED ORDER — OXYCODONE-ACETAMINOPHEN 5-325 MG PO TABS: 2.0000 | ORAL_TABLET | ORAL | Status: AC | PRN

## 2011-11-10 MED ORDER — HYDROMORPHONE HCL PF 1 MG/ML IJ SOLN
1.0000 mg | Freq: Once | INTRAMUSCULAR | Status: AC
  Administered 2011-11-10: 1 mg via INTRAVENOUS
  Filled 2011-11-10: qty 1

## 2011-11-10 MED ORDER — DIAZEPAM 5 MG/ML IJ SOLN
5.0000 mg | Freq: Once | INTRAMUSCULAR | Status: AC
  Administered 2011-11-10: 5 mg via INTRAVENOUS
  Filled 2011-11-10: qty 2

## 2011-11-10 NOTE — ED Notes (Signed)
Pt reports lower back pain that has been going on for about a week. Pt reports seeing her pcp and was dx with a kidney infection.  Pt reports taking all of her antibiotic w/out any relief.

## 2011-11-10 NOTE — ED Provider Notes (Signed)
History   This chart was scribed for Raeford Razor, MD by [name]. The patient was seen in room APA01/APA01 and the patient's care was started at 5:41PM.   CSN: [id number] Arrival date & time: 11/10/2011  5:18 PM   First MD Initiated Contact with Patient 11/10/11 1722      Chief Complaint  Patient presents with  . Back Pain    (Consider location/radiation/quality/duration/timing/severity/associated sxs/prior treatment) HPI [name] is a 50 y.o. female who presents to the Emergency Department complaining of constant moderate to severe, non-radiating left lower back pain onset several weeks ago and persistent since with associated urinary hesitancy, nausea and vomiting. Patient reports back pain was mildly relieved with use of pain medication and is aggravated by nothing. Denies fall/trauma, head injury, numbness, tingling. Notes she was evaluated in ED 2 weeks ago and was prescribed 5 day course of abx which was finished 1 week ago and pain medication with mild to moderate relief but symptoms returned. Patient reports history of previous similar symptoms with previous kidney infection. Patient with h/o cholecystectomy, abdominal surgery, hypertension, bipolar 1 disorder, hypercholesterolemia, stomach ulcer and chronic pain.  Past Medical History  Diagnosis Date  . Hypertension   . Bipolar 1 disorder   . Depression   . Seizures   . Thyroid disease   . Hypercholesteremia   . Acid reflux   . Chronic pain   . Stomach ulcer   . Headache   . Anxiety     Past Surgical History  Procedure Date  . Cholecystectomy   . Tonsillectomy   . Stomach surgery     Family History  Problem Relation Age of Onset  . Hypertension Father   . Diabetes type II Brother   . Hypertension Brother   . Diabetes type II Mother     History  Substance Use Topics  . Smoking status: Never Smoker   . Smokeless tobacco: Not on file  . Alcohol Use: No    OB History    Grav Para Term Preterm  Abortions TAB SAB Ect Mult Living                  Review of Systems 10 Systems reviewed and are negative for acute change except as noted in the HPI.  Allergies  Cephalexin; Erythromycin; Metoclopramide hcl; Ondansetron hcl; Penicillins; Sumatriptan; Tetracycline; and Tramadol hcl  Home Medications     BP 114/72  Pulse 69  Temp(Src) 97.4 F (36.3 C) (Oral)  Resp 20  Ht 5' 4.5" (1.638 m)  Wt 203 lb (92.08 kg)  BMI 34.31 kg/m2  SpO2 98%  Physical Exam  Nursing note and vitals reviewed. Constitutional: She is oriented to person, place, and time. She appears well-developed and well-nourished. No distress.  HENT:  Head: Normocephalic and atraumatic.  Eyes: EOM are normal. Pupils are equal, round, and reactive to light.  Neck: Neck supple. No tracheal deviation present.  Cardiovascular: Normal rate and regular rhythm.  Exam reveals no gallop and no friction rub.   No murmur heard. Pulmonary/Chest: Effort normal. No respiratory distress. She has no wheezes.  Abdominal: Soft. She exhibits no distension. There is tenderness in the left upper quadrant and left lower quadrant. There is no rebound, no guarding and no CVA tenderness.  Musculoskeletal: Normal range of motion. She exhibits no edema.       No pain with internal and external rotation of bilateral hips. No midline spinal tenderness. No tenderness of left SI joint.  Neurological: She is alert and oriented to person, place, and time. No sensory deficit.       Lower extremity strength normal and equal bilaterally.   Skin: Skin is warm and dry.  Psychiatric: She has a normal mood and affect. Her behavior is normal.    ED Course  Procedures (including critical care time)  DIAGNOSTIC STUDIES: Oxygen Saturation is 100% on room air, normal by my interpretation.    COORDINATION OF CARE: 7:59PM- Patient informed of imaging result and that course of care will be determined with consulting physician. Patient agrees with  current plan set forth at this time.  8:03PM- Upon review of patient records, records indicate patient's appendix has already been removed although patient originally noted she was unsure.   Labs Reviewed  URINALYSIS, ROUTINE W REFLEX MICROSCOPIC - Abnormal; Notable for the following:    Color, Urine AMBER (*) BIOCHEMICALS MAY BE AFFECTED BY COLOR   APPearance CLOUDY (*)    Specific Gravity, Urine >1.030 (*)    Bilirubin Urine SMALL (*)    Ketones, ur TRACE (*)    All other components within normal limits  BASIC METABOLIC PANEL - Abnormal; Notable for the following:    Glucose, Bld 103 (*)    GFR calc non Af Amer 76 (*)    GFR calc Af Amer 88 (*)    All other components within normal limits  CBC - Abnormal; Notable for the following:    Hemoglobin 11.8 (*)    HCT 35.0 (*)    All other components within normal limits  PREGNANCY, URINE   Ct Abdomen Pelvis Wo Contrast  11/10/2011  *RADIOLOGY REPORT*  Clinical Data: Left flank pain and lower back pain for several weeks.  Recently treated for urinary tract infection.  Nausea, vomiting.  History of cholecystectomy, exploratory laparoscopy, hypertension.  Postmenopausal.  CT ABDOMEN AND PELVIS WITHOUT CONTRAST  Technique:  Multidetector CT imaging of the abdomen and pelvis was performed following the standard protocol without intravenous contrast.  Comparison: MRI of the abdomen 06/06/2010  Findings: Images of the lung bases are unremarkable.  Surgical clips are identified throughout the upper abdomen.  No focal abnormality is identified within the liver, spleen, pancreas, adrenal glands, or kidneys.   No intrarenal or ureteral calculi identified.  Multiple calcifications are identified within the left renal pelvis, consistent with phleboliths.  Minimal stranding is identified in the region of the terminal ileum/ascending colon.  In this region, there is a single surgical clip, suggesting possible prior appendectomy.  The appendix is not well seen.   No retroperitoneal or mesenteric adenopathy. No evidence for aortic aneurysm.  The uterus is present.  No adnexal mass or free pelvic fluid.  Mild degenerate changes are seen in the lower lumbar spine and visualized thoracic spine.  IMPRESSION:  1.  No evidence for renal or ureteral calculi. 2. Mild inflammatory change in the region of the terminal ileum/cecum.  Differential diagnosis includes Crohn disease, appendicitis unless the patient has had appendectomy, or colitis.  Original Report Authenticated By: Patterson Hammersmith, M.D.     1. Abdominal pain   2. Colitis, nonspecific   3. Ileitis, terminal       MDM  50yF with abdomina/back pain. CT with mild inflammatory changes in terminal ileum/r colon. Previous notes report s/p appy, surgical clip in RLQ and no appendix visualized. No Leukocytosis. Suspect not appy but colitis/inflammatory bowel disease. UA not consistent with UTI. Will tx with course of steroids and pain meds. FU with  GI. Pt says previous known to Dr Darrick Penna.     I personally preformed the services scribed in my presence. The recorded information has been reviewed and considered. Raeford Razor, MD.    Raeford Razor, MD 11/10/11 2008

## 2011-11-10 NOTE — ED Notes (Signed)
Pt reporting pain in left lower flank.  Reports she did have recent treatment for kidney infection.  Completed antibiotics 1 week ago, states that the pain she is presently experiencing feels like same symptoms.  Reports pressure and urinary retention.

## 2011-11-10 NOTE — ED Notes (Signed)
Pt given discharge instructions, paperwork & prescription(s), pt verbalized understanding.   

## 2011-11-16 ENCOUNTER — Telehealth: Payer: Self-pay

## 2011-11-16 NOTE — Telephone Encounter (Signed)
Pt called again at 1230pm today. She asked if she had an appt tomorrow at 2pm. I looked up pts future appts and informed her that there was not an appointment scheduled for tomorrow at 2pm. Pt stated she should have one , I informed her that she would need to speak with our office manager. Our office manager was working with a pt at the time and I told her I  would have her call her back.

## 2011-11-16 NOTE — Telephone Encounter (Signed)
Pt called on 11/11/11 to make an appointment because she was seen in the ER and they told her to follow up with GI. She wanted to make an appointment but she was discharged from our office. I told her she could cal Dr. Patty Sermons office and I gave her the nu,ber to call them. She then called back and said they told her we would need to fax them over a referral so I said ok. I then called Dr. Patty Sermons office and told them what was going on. They said that would look into it and call her.

## 2011-11-16 NOTE — Telephone Encounter (Signed)
Pt had called one day last week and spoke with GW about making an OV. It is flagged that patient was discharged from [facility] in June 2011 and patient received discharge letter. Today 11/16/11 patient calls office to verify her appointment for tomorrow. I told her that she did not have an appointment with [place] and GW made me aware that patient had called once before this morning asking the same thing. I asked patient who here OV was with an she said she thought it was with one of our extenders. Again I told patient that she did not have an OV with [place] and maybe she had one with NUR since a lot of patients get our numbers confused. I gave patient his number and she was going to call them

## 2011-11-16 NOTE — Telephone Encounter (Signed)
I explained to the patient she had been d/c'd from our practice and she could follow-up with her pcp with a referral to see another GI Md in the area.  She didn't acknowledge my request and disconnected the line.

## 2011-11-26 ENCOUNTER — Encounter (HOSPITAL_COMMUNITY): Payer: Self-pay | Admitting: *Deleted

## 2011-11-26 ENCOUNTER — Emergency Department (HOSPITAL_COMMUNITY)
Admission: EM | Admit: 2011-11-26 | Discharge: 2011-11-27 | Disposition: A | Discharge status: Home / Self Care | Payer: Medicare Other | Attending: Emergency Medicine | Admitting: Emergency Medicine

## 2011-11-26 DIAGNOSIS — F319 Bipolar disorder, unspecified: Secondary | ICD-10-CM | POA: Insufficient documentation

## 2011-11-26 DIAGNOSIS — E7800 Pure hypercholesterolemia, unspecified: Secondary | ICD-10-CM | POA: Insufficient documentation

## 2011-11-26 DIAGNOSIS — R109 Unspecified abdominal pain: Secondary | ICD-10-CM | POA: Insufficient documentation

## 2011-11-26 DIAGNOSIS — K117 Disturbances of salivary secretion: Secondary | ICD-10-CM | POA: Insufficient documentation

## 2011-11-26 DIAGNOSIS — K5900 Constipation, unspecified: Secondary | ICD-10-CM | POA: Insufficient documentation

## 2011-11-26 DIAGNOSIS — F341 Dysthymic disorder: Secondary | ICD-10-CM | POA: Insufficient documentation

## 2011-11-26 DIAGNOSIS — K219 Gastro-esophageal reflux disease without esophagitis: Secondary | ICD-10-CM | POA: Insufficient documentation

## 2011-11-26 DIAGNOSIS — R1110 Vomiting, unspecified: Secondary | ICD-10-CM | POA: Insufficient documentation

## 2011-11-26 DIAGNOSIS — I10 Essential (primary) hypertension: Secondary | ICD-10-CM | POA: Insufficient documentation

## 2011-11-26 LAB — URINALYSIS, ROUTINE W REFLEX MICROSCOPIC
Glucose, UA: NEGATIVE mg/dL
Leukocytes, UA: NEGATIVE
Nitrite: NEGATIVE
Specific Gravity, Urine: <1.005 — ABNORMAL LOW (ref 1.005–1.030)
pH: 7 (ref 5.0–8.0)

## 2011-11-26 NOTE — ED Notes (Signed)
Back pain with vomiting for 2 weeks

## 2011-11-27 MED ORDER — HYDROMORPHONE HCL PF 2 MG/ML IJ SOLN
2.0000 mg | Freq: Once | INTRAMUSCULAR | Status: AC
  Administered 2011-11-27: 2 mg via INTRAMUSCULAR

## 2011-11-27 MED ORDER — HYDROMORPHONE HCL PF 2 MG/ML IJ SOLN
2.0000 mg | Freq: Once | INTRAMUSCULAR | Status: DC
  Filled 2011-11-27: qty 1

## 2011-11-27 MED ORDER — HYDROCODONE-ACETAMINOPHEN 5-325 MG PO TABS: 1.0000 | ORAL_TABLET | Freq: Four times a day (QID) | ORAL | Status: AC | PRN

## 2011-11-27 NOTE — ED Provider Notes (Signed)
History     CSN: [id number]  Arrival date & time 11/26/11  2043   First MD Initiated Contact with Patient 11/27/11 0007      Chief Complaint  Patient presents with  . Back Pain    (Consider location/radiation/quality/duration/timing/severity/associated sxs/prior treatment) HPI This is a 50 year old white female with about a three-week history of abdominal pain, constipation, vomiting and left flank pain. She was seen in the ED on the 11th of this month. A CT scan without contrast showed no evidence of ureteral stone but did show some inflammation about the terminal ileum. She was referred to gastroenterology but states she could not afford to do so. She states the abdominal pain has improved but over the past 2 days the left flank pain has become severe. It is worse with movement or palpation. She states she's been having a low-grade fever. She denies dysuria or hematuria. She continues to have vomiting.she denies chest pain or shortness of breath.she does complain of dry mouth and thinks she may be dehydrated.  Past Medical History  Diagnosis Date  . Hypertension   . Bipolar 1 disorder   . Depression   . Seizures   . Thyroid disease   . Hypercholesteremia   . Acid reflux   . Chronic pain   . Stomach ulcer   . Headache   . Anxiety     Past Surgical History  Procedure Date  . Cholecystectomy   . Tonsillectomy   . Stomach surgery     Family History  Problem Relation Age of Onset  . Hypertension Father   . Diabetes type II Brother   . Hypertension Brother   . Diabetes type II Mother     History  Substance Use Topics  . Smoking status: Never Smoker   . Smokeless tobacco: Not on file  . Alcohol Use: No    OB History    Grav Para Term Preterm Abortions TAB SAB Ect Mult Living                  Review of Systems  All other systems reviewed and are negative.    Allergies  Aspirin; Cephalexin; Erythromycin; Metoclopramide hcl; Ondansetron hcl; Penicillins;  Sumatriptan; Tetracycline; and Toradol  Home Medications   Current Outpatient Rx  Name Route Sig Dispense Refill  . ALPRAZOLAM 1 MG PO TABS Oral Take 1 mg by mouth 4 (four) times daily.     . ARIPIPRAZOLE 10 MG PO TABS Oral Take 10 mg by mouth every morning.     Marland Kitchen CLONIDINE HCL 0.1 MG PO TABS Oral Take 1 tablet (0.1 mg total) by mouth 2 (two) times daily. 1 tablet 0    Hold for 1 week then resume  . DULOXETINE HCL 60 MG PO CPEP Oral Take 60 mg by mouth daily.     Marland Kitchen LABETALOL HCL 200 MG PO TABS Oral Take 1 tablet (200 mg total) by mouth 2 (two) times daily. 1 tablet 0    Hold for 1 week then resume  . LEVETIRACETAM 500 MG PO TABS Oral Take 500-1,000 mg by mouth 2 (two) times daily. Take 1 by mouth in the morning and take 2 at bedtime    . LISINOPRIL 5 MG PO TABS Oral Take 1 tablet (5 mg total) by mouth daily. 1 tablet 0    Hold for a week then resume  . LUBIPROSTONE 8 MCG PO CAPS Oral Take 8 mcg by mouth 2 (two) times daily with a meal.     .  OMEPRAZOLE 40 MG PO CPDR Oral Take 40 mg by mouth daily.     Marland Kitchen SIMVASTATIN 20 MG PO TABS Oral Take 20 mg by mouth at bedtime.     Marland Kitchen ZOLPIDEM TARTRATE 10 MG PO TABS Oral Take 10 mg by mouth at bedtime as needed. For sleep     . ACETAMINOPHEN 500 MG PO TABS Oral Take 500 mg by mouth every 6 (six) hours as needed. pain      BP 153/115  Pulse 100  Temp(Src) 98.5 F (36.9 C) (Oral)  Resp 18  Ht 5\' 4"  (1.626 m)  Wt 210 lb (95.255 kg)  BMI 36.05 kg/m2  SpO2 99%  Physical Exam General: Well-developed, well-nourished female in no acute distress; appearance consistent with age of record HENT: normocephalic, atraumatic Eyes: pupils equal round and reactive to light; extraocular muscles intact; mydriasis Neck: supple Heart: regular rate and rhythm Lungs: clear to auscultation bilaterally Abdomen: soft; mild to moderate epigastric tenderness; nondistended; bowel sounds present Back: left flank tenderness Extremities: No deformity; full range of  motion Neurologic: Awake, alert and oriented; motor function intact in all extremities and symmetric; no facial droop Skin: Warm and dry    ED Course  Procedures (including critical care time)   MDM   Nursing notes and vitals signs, including pulse oximetry, reviewed.  Summary of this visit's results, reviewed by myself:  Labs:  Results for orders placed during the hospital encounter of 11/26/11  URINALYSIS, ROUTINE W REFLEX MICROSCOPIC      Component Value Range   Color, Urine YELLOW  YELLOW    APPearance CLEAR  CLEAR    Specific Gravity, Urine <1.005 (*) 1.005 - 1.030    pH 7.0  5.0 - 8.0    Glucose, UA NEGATIVE  NEGATIVE (mg/dL)   Hgb urine dipstick NEGATIVE  NEGATIVE    Bilirubin Urine NEGATIVE  NEGATIVE    Ketones, ur NEGATIVE  NEGATIVE (mg/dL)   Protein, ur NEGATIVE  NEGATIVE (mg/dL)   Urobilinogen, UA 0.2  0.0 - 1.0 (mg/dL)   Nitrite NEGATIVE  NEGATIVE    Leukocytes, UA NEGATIVE  NEGATIVE    12:44 AM Results of previous visit CT scan reviewed. No evidence of ureteral stone was seen. Patient's symptomatology from a gastroenterological standpoint have improved. Patient states she did get relief previously with hydrocodone; we will give her a refill. The importance of followup was stressed.       Hanley Seamen, MD 11/27/11 [phone]

## 2011-11-27 NOTE — ED Notes (Signed)
Pt given Dilauded IM and discharge papers and instructed to wait for 15 minutes for me to come back and let her sign. When I went back in room the patient was gone.  She did however verbalize understanding of discharge instructions when it was discussed.

## 2012-01-21 ENCOUNTER — Emergency Department (HOSPITAL_COMMUNITY): Payer: Medicare Other

## 2012-01-21 ENCOUNTER — Encounter (HOSPITAL_COMMUNITY): Payer: Self-pay | Admitting: *Deleted

## 2012-01-21 ENCOUNTER — Emergency Department (HOSPITAL_COMMUNITY)
Admission: EM | Admit: 2012-01-21 | Discharge: 2012-01-21 | Disposition: A | Discharge status: Home Health | Payer: Medicare Other | Attending: Emergency Medicine | Admitting: Emergency Medicine

## 2012-01-21 DIAGNOSIS — F3130 Bipolar disorder, current episode depressed, mild or moderate severity, unspecified: Secondary | ICD-10-CM | POA: Insufficient documentation

## 2012-01-21 DIAGNOSIS — M549 Dorsalgia, unspecified: Secondary | ICD-10-CM | POA: Insufficient documentation

## 2012-01-21 DIAGNOSIS — G40909 Epilepsy, unspecified, not intractable, without status epilepticus: Secondary | ICD-10-CM | POA: Insufficient documentation

## 2012-01-21 DIAGNOSIS — E7800 Pure hypercholesterolemia, unspecified: Secondary | ICD-10-CM | POA: Insufficient documentation

## 2012-01-21 DIAGNOSIS — W010XXA Fall on same level from slipping, tripping and stumbling without subsequent striking against object, initial encounter: Secondary | ICD-10-CM | POA: Insufficient documentation

## 2012-01-21 DIAGNOSIS — S20229A Contusion of unspecified back wall of thorax, initial encounter: Secondary | ICD-10-CM | POA: Insufficient documentation

## 2012-01-21 DIAGNOSIS — R51 Headache: Secondary | ICD-10-CM | POA: Insufficient documentation

## 2012-01-21 DIAGNOSIS — G8929 Other chronic pain: Secondary | ICD-10-CM | POA: Insufficient documentation

## 2012-01-21 DIAGNOSIS — I10 Essential (primary) hypertension: Secondary | ICD-10-CM | POA: Insufficient documentation

## 2012-01-21 DIAGNOSIS — K219 Gastro-esophageal reflux disease without esophagitis: Secondary | ICD-10-CM | POA: Insufficient documentation

## 2012-01-21 DIAGNOSIS — S0003XA Contusion of scalp, initial encounter: Secondary | ICD-10-CM | POA: Insufficient documentation

## 2012-01-21 HISTORY — DX: Crohn's disease, unspecified, without complications: K50.90

## 2012-01-21 LAB — URINALYSIS, ROUTINE W REFLEX MICROSCOPIC
Bilirubin Urine: NEGATIVE
Glucose, UA: NEGATIVE mg/dL
Hgb urine dipstick: NEGATIVE
Specific Gravity, Urine: 1.025 (ref 1.005–1.030)
pH: 5.5 (ref 5.0–8.0)

## 2012-01-21 LAB — URINE MICROSCOPIC-ADD ON

## 2012-01-21 MED ORDER — IBUPROFEN 800 MG PO TABS
800.0000 mg | ORAL_TABLET | Freq: Once | ORAL | Status: AC
  Administered 2012-01-21: 800 mg via ORAL
  Filled 2012-01-21: qty 1

## 2012-01-21 MED ORDER — HYDROCODONE-ACETAMINOPHEN 5-325 MG PO TABS: 1.0000 | ORAL_TABLET | Freq: Four times a day (QID) | ORAL | Status: AC | PRN

## 2012-01-21 MED ORDER — HYDROMORPHONE HCL PF 1 MG/ML IJ SOLN
1.0000 mg | Freq: Once | INTRAMUSCULAR | Status: AC
  Administered 2012-01-21: 1 mg via INTRAMUSCULAR
  Filled 2012-01-21: qty 1

## 2012-01-21 MED ORDER — PROMETHAZINE HCL 25 MG/ML IJ SOLN
25.0000 mg | Freq: Once | INTRAMUSCULAR | Status: AC
  Administered 2012-01-21: 25 mg via INTRAMUSCULAR
  Filled 2012-01-21: qty 1

## 2012-01-21 NOTE — ED Provider Notes (Signed)
Medical screening examination/treatment/procedure(s) were performed by non-physician practitioner and as supervising physician I was immediately available for consultation/collaboration.   [name], MD 01/21/12 [phone]

## 2012-01-21 NOTE — ED Notes (Signed)
Neck and back pain for 2 days after fall, Fell on cord in house.  Headache for 4 days.

## 2012-01-21 NOTE — ED Provider Notes (Signed)
History     CSN: [id number]  Arrival date & time 01/21/12  1508   None     Chief Complaint  Patient presents with  . Back Pain    (Consider location/radiation/quality/duration/timing/severity/associated sxs/prior treatment) HPI Comments: Tripped over an electric cord 2-3 days ago.  Struck L side of head and has had headache since then.  No LOC.  Not taking any anticoagulants.  Also, has pain in area of L kidney.  + pain with deep inspiration and palpation.  Denies hematuria.  States when she fell her neck "popped" but that it does not hurt at all.  The history is provided by the patient. No language interpreter was used.    Past Medical History  Diagnosis Date  . Hypertension   . Bipolar 1 disorder   . Depression   . Seizures   . Thyroid disease   . Hypercholesteremia   . Acid reflux   . Chronic pain   . Stomach ulcer   . Headache   . Anxiety   . Crohn's disease     Past Surgical History  Procedure Date  . Cholecystectomy   . Tonsillectomy   . Stomach surgery     Family History  Problem Relation Age of Onset  . Hypertension Father   . Diabetes type II Brother   . Hypertension Brother   . Diabetes type II Mother     History  Substance Use Topics  . Smoking status: Never Smoker   . Smokeless tobacco: Not on file  . Alcohol Use: No    OB History    Grav Para Term Preterm Abortions TAB SAB Ect Mult Living                  Review of Systems  Genitourinary: Negative for dysuria, urgency, frequency, hematuria and flank pain.  Musculoskeletal: Positive for back pain.  Neurological: Positive for headaches.  All other systems reviewed and are negative.    Allergies  Aspirin; Cephalexin; Erythromycin; Metoclopramide hcl; Ondansetron hcl; Penicillins; Sumatriptan; Tetracycline; and Toradol  Home Medications     BP 126/63  Pulse 102  Temp(Src) 97.4 F (36.3 C) (Oral)  Resp 18  SpO2 97%  Physical Exam  Nursing note and vitals  reviewed. Constitutional: She is oriented to person, place, and time. She appears well-developed and well-nourished. No distress.  HENT:  Head: Normocephalic. Head is without raccoon's eyes, without Battle's sign, without abrasion and without contusion.    Right Ear: External ear normal.  Left Ear: External ear normal.  Eyes: EOM are normal. Pupils are equal, round, and reactive to light.  Neck: Normal range of motion. No tracheal deviation present.  Cardiovascular: Normal rate, regular rhythm and normal heart sounds.   Pulmonary/Chest: Effort normal and breath sounds normal.  Abdominal: Soft. She exhibits no distension. There is no tenderness.  Musculoskeletal: Normal range of motion. She exhibits tenderness.  Neurological: She is alert and oriented to person, place, and time. No cranial nerve deficit. Coordination normal.  Skin: Skin is warm and dry.  Psychiatric: She has a normal mood and affect. Judgment normal.    ED Course  Procedures (including critical care time)  Labs Reviewed - No data to display No results found.   No diagnosis found.    MDM          Worthy Rancher, PA 01/21/12 [phone]

## 2012-01-21 NOTE — Discharge Instructions (Signed)
Contusion A contusion is a deep bruise. Bruises happen when an injury causes bleeding under the skin. Signs of bruising include pain, puffiness (swelling), and discolored skin. The bruise may turn blue, purple, or yellow. HOME CARE   Rest the injured area until the pain and puffiness are better.   Try to limit use of the injured area as much as possible or as told by your doctor.   Put ice on the injured area.   Put ice in a plastic bag.   Place a towel between your skin and the bag.   Leave the ice on for 15 to 20 minutes, 3 to 4 times a day.   Raise (elevate) the injured area above the level of the heart.   Use an elastic bandage to lessen puffiness and motion.   Only take medicine as told by your doctor.   Eat healthy.   See your doctor for a follow-up visit.  GET HELP RIGHT AWAY IF:   There is more redness, puffiness, or pain.   You have a headache, muscle ache, or you feel dizzy and ill.   You have a fever.   The pain is not controlled with medicine.   The bruise is not getting better.   There is yellowish white fluid (pus) coming from the wound.   You lose feeling (numbness) in the injured area.   The bruised area feels cold.   There are new problems.  MAKE SURE YOU:   Understand these instructions.   Will watch your condition.   Will get help right away if you are not doing well or get worse.  Document Released: 05/04/2008 Document Revised: 07/29/2011 Document Reviewed: 05/04/2008 [facility] Patient Information 2012 [place], [place].Headache Headaches are caused by many different problems. Most commonly, headache is caused by muscle tension from an injury, fatigue, or emotional upset. Excessive muscle contractions in the scalp and neck result in a headache that often feels like a tight band around the head. Tension headaches often have areas of tenderness over the scalp and the back of the neck. These headaches may last for hours, days, or longer, and some may  contribute to migraines in those who have migraine problems. Migraines usually cause a throbbing headache, which is made worse by activity. Sometimes only one side of the head hurts. Nausea, vomiting, eye pain, and avoidance of food are common with migraines. Visual symptoms such as light sensitivity, blind spots, or flashing lights may also occur. Loud noises may worsen migraine headaches. Many factors may cause migraine headaches:  Emotional stress, lack of sleep, and menstrual periods.   Alcohol and some drugs (such as birth control pills).   Diet factors (fasting, caffeine, food preservatives, chocolate).   Environmental factors (weather changes, bright lights, odors, smoke).  Other causes of headaches include minor injuries to the head. Arthritis in the neck; problems with the jaw, eyes, ears, or nose are also causes of headaches. Allergies, drugs, alcohol, and exposure to smoke can also cause moderate headaches. Rebound headaches can occur if someone uses pain medications for a long period of time and then stops. Less commonly, blood vessel problems in the neck and brain (including stroke) can cause various types of headache. Treatment of headaches includes medicines for pain and relaxation. Ice packs or heat applied to the back of the head and neck help some people. Massaging the shoulders, neck and scalp are often very useful. Relaxation techniques and stretching can help prevent these headaches. Avoid alcohol and cigarette smoking as  these tend to make headaches worse. Please see your caregiver if your headache is not better in 2 days.  SEEK IMMEDIATE MEDICAL CARE IF:   You develop a high fever, chills, or repeated vomiting.   You faint or have difficulty with vision.   You develop unusual numbness or weakness of your arms or legs.   Relief of pain is inadequate with medication, or you develop severe pain.   You develop confusion, or neck stiffness.   You have a worsening of a  headache or do not obtain relief.  Document Released: 11/16/2005 Document Revised: 07/29/2011 Document Reviewed: 05/12/2007 [facility] Patient Information 2012 [place], [place].    Take the pain medicine as directed.  Take ibuprofen up to 800 mg every 8 hrs with food.  Apply ice 10-15 min several times daily to areas of soreness.

## 2012-03-01 ENCOUNTER — Encounter (HOSPITAL_COMMUNITY): Payer: Self-pay | Admitting: Emergency Medicine

## 2012-03-01 ENCOUNTER — Emergency Department (HOSPITAL_COMMUNITY): Payer: Medicare Other

## 2012-03-01 ENCOUNTER — Emergency Department (HOSPITAL_COMMUNITY)
Admission: EM | Admit: 2012-03-01 | Discharge: 2012-03-01 | Disposition: A | Discharge status: Home / Self Care | Payer: Medicare Other | Attending: Emergency Medicine | Admitting: Emergency Medicine

## 2012-03-01 DIAGNOSIS — R350 Frequency of micturition: Secondary | ICD-10-CM | POA: Insufficient documentation

## 2012-03-01 DIAGNOSIS — G8929 Other chronic pain: Secondary | ICD-10-CM | POA: Insufficient documentation

## 2012-03-01 DIAGNOSIS — R300 Dysuria: Secondary | ICD-10-CM | POA: Insufficient documentation

## 2012-03-01 DIAGNOSIS — R109 Unspecified abdominal pain: Secondary | ICD-10-CM | POA: Insufficient documentation

## 2012-03-01 DIAGNOSIS — R630 Anorexia: Secondary | ICD-10-CM | POA: Insufficient documentation

## 2012-03-01 DIAGNOSIS — R509 Fever, unspecified: Secondary | ICD-10-CM | POA: Insufficient documentation

## 2012-03-01 DIAGNOSIS — K5010 Crohn's disease of large intestine without complications: Secondary | ICD-10-CM | POA: Insufficient documentation

## 2012-03-01 DIAGNOSIS — I10 Essential (primary) hypertension: Secondary | ICD-10-CM | POA: Insufficient documentation

## 2012-03-01 DIAGNOSIS — G40909 Epilepsy, unspecified, not intractable, without status epilepticus: Secondary | ICD-10-CM | POA: Insufficient documentation

## 2012-03-01 DIAGNOSIS — F3130 Bipolar disorder, current episode depressed, mild or moderate severity, unspecified: Secondary | ICD-10-CM | POA: Insufficient documentation

## 2012-03-01 DIAGNOSIS — E7800 Pure hypercholesterolemia, unspecified: Secondary | ICD-10-CM | POA: Insufficient documentation

## 2012-03-01 DIAGNOSIS — R112 Nausea with vomiting, unspecified: Secondary | ICD-10-CM | POA: Insufficient documentation

## 2012-03-01 DIAGNOSIS — M545 Low back pain, unspecified: Secondary | ICD-10-CM | POA: Insufficient documentation

## 2012-03-01 DIAGNOSIS — K219 Gastro-esophageal reflux disease without esophagitis: Secondary | ICD-10-CM | POA: Insufficient documentation

## 2012-03-01 LAB — URINALYSIS, ROUTINE W REFLEX MICROSCOPIC
Hgb urine dipstick: NEGATIVE
Leukocytes, UA: NEGATIVE
Nitrite: NEGATIVE
Protein, ur: NEGATIVE mg/dL
Specific Gravity, Urine: 1.01 (ref 1.005–1.030)
Urobilinogen, UA: 0.2 mg/dL (ref 0.0–1.0)

## 2012-03-01 LAB — DIFFERENTIAL
Basophils Absolute: 0 10*3/uL (ref 0.0–0.1)
Eosinophils Relative: 2 % (ref 0–5)
Lymphocytes Relative: 28 % (ref 12–46)
Neutrophils Relative %: 64 % (ref 43–77)

## 2012-03-01 LAB — COMPREHENSIVE METABOLIC PANEL
ALT: 29 U/L (ref 0–35)
AST: 34 U/L (ref 0–37)
Alkaline Phosphatase: 116 U/L (ref 39–117)
CO2: 28 mEq/L (ref 19–32)
Calcium: 9.7 mg/dL (ref 8.4–10.5)
GFR calc non Af Amer: >90 mL/min (ref >90)
Potassium: 3.3 mEq/L — ABNORMAL LOW (ref 3.5–5.1)
Sodium: 138 mEq/L (ref 135–145)
Total Protein: 7.3 g/dL (ref 6.0–8.3)

## 2012-03-01 LAB — CBC
MCV: 80.6 fL (ref 78.0–100.0)
Platelets: 308 10*3/uL (ref 150–400)
RBC: 4.23 MIL/uL (ref 3.87–5.11)
RDW: 14.9 % (ref 11.5–15.5)
WBC: 8.2 10*3/uL (ref 4.0–10.5)

## 2012-03-01 MED ORDER — PROMETHAZINE HCL 25 MG PO TABS: 25.0000 mg | ORAL_TABLET | Freq: Four times a day (QID) | ORAL | Status: DC | PRN

## 2012-03-01 MED ORDER — CIPROFLOXACIN HCL 250 MG PO TABS
500.0000 mg | ORAL_TABLET | Freq: Once | ORAL | Status: AC
  Administered 2012-03-01: 500 mg via ORAL
  Filled 2012-03-01: qty 2

## 2012-03-01 MED ORDER — HYDROMORPHONE HCL PF 1 MG/ML IJ SOLN
0.5000 mg | Freq: Once | INTRAMUSCULAR | Status: AC
  Administered 2012-03-01: 0.5 mg via INTRAMUSCULAR

## 2012-03-01 MED ORDER — PREDNISONE 20 MG PO TABS
60.0000 mg | ORAL_TABLET | Freq: Once | ORAL | Status: AC
  Administered 2012-03-01: 60 mg via ORAL
  Filled 2012-03-01: qty 3

## 2012-03-01 MED ORDER — METRONIDAZOLE 500 MG PO TABS: 500.0000 mg | ORAL_TABLET | Freq: Two times a day (BID) | ORAL | Status: AC

## 2012-03-01 MED ORDER — PROMETHAZINE HCL 25 MG/ML IJ SOLN
25.0000 mg | Freq: Once | INTRAMUSCULAR | Status: AC
  Administered 2012-03-01: 25 mg via INTRAMUSCULAR
  Filled 2012-03-01: qty 1

## 2012-03-01 MED ORDER — METRONIDAZOLE IN NACL 5-0.79 MG/ML-% IV SOLN: 500.0000 mg | Freq: Once | INTRAVENOUS | Status: DC

## 2012-03-01 MED ORDER — CIPROFLOXACIN HCL 500 MG PO TABS: 500.0000 mg | ORAL_TABLET | Freq: Two times a day (BID) | ORAL | Status: AC

## 2012-03-01 MED ORDER — PREDNISONE 10 MG PO TABS: 20.0000 mg | ORAL_TABLET | Freq: Every day | ORAL | Status: AC

## 2012-03-01 MED ORDER — HYDROMORPHONE HCL PF 1 MG/ML IJ SOLN
0.5000 mg | Freq: Once | INTRAMUSCULAR | Status: DC
  Filled 2012-03-01: qty 1

## 2012-03-01 MED ORDER — METRONIDAZOLE 500 MG PO TABS
500.0000 mg | ORAL_TABLET | Freq: Once | ORAL | Status: AC
  Administered 2012-03-01: 500 mg via ORAL
  Filled 2012-03-01: qty 1

## 2012-03-01 MED ORDER — SODIUM CHLORIDE 0.9 % IV BOLUS (SEPSIS): 1000.0000 mL | Freq: Once | INTRAVENOUS | Status: DC

## 2012-03-01 MED ORDER — HYDROMORPHONE HCL PF 2 MG/ML IJ SOLN
2.0000 mg | Freq: Once | INTRAMUSCULAR | Status: AC
  Administered 2012-03-01: 2 mg via INTRAMUSCULAR
  Filled 2012-03-01: qty 1

## 2012-03-01 MED ORDER — CIPROFLOXACIN IN D5W 400 MG/200ML IV SOLN: 400.0000 mg | Freq: Once | INTRAVENOUS | Status: DC

## 2012-03-01 NOTE — ED Notes (Signed)
Several attempts made for iv sticks by several different RN's without success. Dr. Rosalia Hammers notified, attempted X2 herself without success, order for pain medication changed to IM, order for CT changed to PO only, ct notified.

## 2012-03-01 NOTE — ED Notes (Signed)
Pt c/o dysuria x 4 days °

## 2012-03-01 NOTE — ED Notes (Signed)
Patient requesting something else for pain, EDP made aware. No orders given at this time.

## 2012-03-01 NOTE — Discharge Instructions (Signed)
Crohn's Disease Crohn's disease is a long-term (chronic) soreness and redness (inflammation) of the intestines (bowel). It can affect any portion of the digestive tract, from the mouth to the anus. It can also cause problems outside the digestive tract. Crohn's disease is closely related to a disease called ulcerative colitis (together, these two diseases are called inflammatory bowel disease).   CAUSES   The cause of Crohn's disease is not known. One theory is that, in an easily affected (susceptible) person, the immune system is triggered to attack the body's own digestive tissue. Crohn's disease runs in families. It seems to be more common in certain geographic areas and amongst certain races. There are no clear-cut dietary causes.   SYMPTOMS   Crohn's disease can cause many different symptoms since it can affect many different parts of the body. Symptoms include:  Fatigue.   Weight loss.   Chronic diarrhea, sometime bloody.   Abdominal pain and cramps.   Fever.   Ulcers or canker sores in the mouth or rectum.   Anemia (low red blood cells).   Arthritis, skin problems, and eye problems may occur.  Complications of Crohn's disease can include:  Series of holes (perforation) of the bowel.   Portions of the intestines sticking to each other (adhesions).   Obstruction of the bowel.   Fistula formation, typically in the rectal area but also in other areas. A fistula is an opening between the bowels and the outside, or between the bowels and another organ.   A painful crack in the mucous membrane of the anus (rectal fissure).  DIAGNOSIS   Your caregiver may suspect Crohn's disease based on your symptoms and an exam. Blood tests may confirm that there is a problem. You may be asked to submit a stool specimen for examination. X-rays and CT scans may be necessary. Ultimately, the diagnosis is usually made after a procedure that uses a flexible tube that is inserted via your mouth or your  anus. This is done under sedation and is called either an upper endoscopy or colonoscopy. With these tests, the specialist can take tiny tissue samples and remove them from the inside of the bowel (biopsy). Examination of this biopsy tissue under a microscope can reveal Crohn's disease as the cause of your symptoms. Due to the many different forms that Crohn's disease can take, symptoms may be present for several years before a diagnosis is made. HOME CARE INSTRUCTIONS    There is no cure for Crohn's disease. The best treatment is frequent checkups with your caregiver.   Symptoms such as diarrhea can be controlled with medications. Avoid foods that have a laxative effect such as fresh fruit, vegetables and dairy products. During flare ups, you can rest your bowel by refraining from solid foods. Drink clear liquids frequently during the day (electrolyte or re-hydrating fluids are best. Your caregiver can help you with suggestions). Drink often to prevent loss of body fluids (dehydration). When diarrhea has cleared, eat small meals and more frequently. Avoid food additives and stimulants such as caffeine (coffee, tea, or chocolate). Enzyme supplements may help if you develop intolerance to a sugar in dairy products (lactose). Ask your caregiver or dietitian about specific dietary instructions.   Try to maintain a positive attitude. Learn relaxation techniques such as self hypnosis, mental imaging, and muscle relaxation.   If possible, avoid stresses which can aggravate your condition.   Exercise regularly.   Follow your diet.   Always get plenty of rest.  SEEK MEDICAL   CARE IF:    Your symptoms fail to improve after a week or two of new treatment.   You experience continued weight loss.   You have ongoing crampy digestion or loose bowels.   You develop a new skin rash, skin sores, or eye problems.  SEEK IMMEDIATE MEDICAL CARE IF:    You have worsening of your symptoms or develop new  symptoms.   You have a fever.   You develop bloody diarrhea.   You develop severe abdominal pain.  MAKE SURE YOU:    Understand these instructions.   Will watch your condition.   Will get help right away if you are not doing well or get worse.  Document Released: 08/26/2005 Document Revised: 11/05/2011 Document Reviewed: 07/25/2007 [employer] Patient Information 2012 [employer]. 

## 2012-03-01 NOTE — ED Notes (Signed)
Pt c/o lower back pain and right groin pain that started a few day ago, frequent urination

## 2012-03-01 NOTE — ED Provider Notes (Signed)
History   This chart was scribed for Danielle Quarry, MD by [name]. The patient was seen in room APA11/APA11. Patient's care was started at 0924.    CSN: [id number]  Arrival date & time 03/01/12  5643   First MD Initiated Contact with Patient 03/01/12 [phone]      Chief Complaint  Patient presents with  . Dysuria    (Consider location/radiation/quality/duration/timing/severity/associated sxs/prior treatment) HPI [name] is a 51 y.o. female who presents to the Emergency Department complaining of constant moderate lower abdominal pain onset yesterday and persistent since with associated lower back pain which started 4 days ago, dysuria, foul smelling urine, urinary frequency, nausea, vomiting, subjective fever, chills and decreased appetite. Patient notes pain with urination is primarily localized to right lower abdomen. Denies diarrhea. Patient reports having UTIs previously but has not had a UTI in the past several years. Patient with h/o HTN, bipolar disorder, thyroid disease, hypercholesterolemia, stomach ulcer, crohn's disease.   Past Medical History  Diagnosis Date  . Hypertension   . Bipolar 1 disorder   . Depression   . Seizures   . Thyroid disease   . Hypercholesteremia   . Acid reflux   . Chronic pain   . Stomach ulcer   . Headache   . Anxiety   . Crohn's disease     Past Surgical History  Procedure Date  . Cholecystectomy   . Tonsillectomy   . Stomach surgery     Family History  Problem Relation Age of Onset  . Hypertension Father   . Diabetes type II Brother   . Hypertension Brother   . Diabetes type II Mother     History  Substance Use Topics  . Smoking status: Never Smoker   . Smokeless tobacco: Not on file  . Alcohol Use: No    OB History    Grav Para Term Preterm Abortions TAB SAB Ect Mult Living                  Review of Systems 10 Systems reviewed and all are negative for acute change except as noted in the HPI.   Allergies   Compazine; Aspirin; Cephalexin; Erythromycin; Metoclopramide hcl; Ondansetron hcl; Penicillins; Sumatriptan; Tetracycline; and Toradol  Home Medications   BP 149/84  Pulse 85  Temp 98.7 F (37.1 C)  Resp 16  Ht 5' 4.5" (1.638 m)  Wt 200 lb (90.719 kg)  BMI 33.80 kg/m2  SpO2 97%  Physical Exam  Nursing note and vitals reviewed. Constitutional: She is oriented to person, place, and time. She appears well-developed and well-nourished. No distress.  HENT:  Head: Normocephalic and atraumatic.  Eyes: EOM are normal. Pupils are equal, round, and reactive to light.  Neck: Neck supple. No tracheal deviation present.  Cardiovascular: Normal rate and regular rhythm.  Exam reveals no gallop and no friction rub.   No murmur heard. Pulmonary/Chest: Effort normal. No respiratory distress. She has no wheezes. She has no rales.  Abdominal: Soft. She exhibits no distension. There is tenderness in the right lower quadrant.       Midline abdominal scar.   Musculoskeletal: Normal range of motion. She exhibits no edema.       Diffuse paraspinal tenderness with palpation.   Neurological: She is alert and oriented to person, place, and time. No sensory deficit.  Skin: Skin is warm and dry.  Psychiatric: She has a normal mood and affect. Her behavior is normal.    ED Course  Procedures (  including critical care time)  DIAGNOSTIC STUDIES: Oxygen Saturation is 96% on room air, adequate by my interpretation.    COORDINATION OF CARE: 9:50AM-Patient informed of current plan for treatment and evaluation and agrees with plan at this time.  11:00AM- Attempted to place IV line but unsuccessful.   Labs Reviewed  CBC - Abnormal; Notable for the following:    Hemoglobin 11.1 (*)    HCT 34.1 (*)    All other components within normal limits  COMPREHENSIVE METABOLIC PANEL - Abnormal; Notable for the following:    Potassium 3.3 (*)    Glucose, Bld 125 (*)    Albumin 3.4 (*)    Total Bilirubin 0.2 (*)      All other components within normal limits  URINALYSIS, ROUTINE W REFLEX MICROSCOPIC  DIFFERENTIAL  LIPASE, BLOOD   Ct Abdomen Pelvis Wo Contrast  03/01/2012  *RADIOLOGY REPORT*  Clinical Data: right lower quadrant pain, status post cholecystectomy prior gastric surgery for ulcer  CT ABDOMEN AND PELVIS WITHOUT CONTRAST  Technique:  Multidetector CT imaging of the abdomen and pelvis was performed following the standard protocol without intravenous contrast.  Comparison: 11/10/2011  Findings: Lung bases are unremarkable.  Sagittal images of the spine shows no destructive bony lesions.  Post surgical changes are noted the distal gastric region. Surgical clips are noted in the right upper abdomen.  The patient is status post cholecystectomy.  The study is limited without IV contrast.  The unenhanced liver, spleen, pancreas and adrenal glands are unremarkable.  Unenhanced kidneys are symmetrical in size.  No nephrolithiasis.  No hydronephrosis or hydroureter.  No calcified ureteral calculi are noted.  No aortic aneurysm.  Oral contrast material was given to the patient.  There is significant thickening of the cecal wall.  There is thickening of the wall of the terminal ileum best seen in axial images 69.  Mild stranding of the pericolonic fat in the right lower quadrant of the abdomen.  The appendix is not identified.  A lymph node in the right lower quadrant mesentery axial image 64 measures 8 mm probable reactive in nature.  The findings are highly suspicious for Crohn's disease or  colitis.  No definite neoplastic process is noted on this unenhanced scan.  No mesenteric abscess is noted.  Surgical clip is noted in the right lower quadrant mesentery medially.  No small bowel or colonic obstruction.  Some stool noted in the rectosigmoid colon and left colon.  The unenhanced uterus and adnexa are unremarkable.  IMPRESSION:  1.  There is inflammatory process in the right lower quadrant of the abdomen with  significant thickening of the cecal wall.  There is thickening of the wall of the terminal ileum.  Findings are highly suspicious for Crohn's disease or colitis.  The appendix is not identified.  No definite neoplastic process is identified on this unenhanced scan.  Follow-up colonoscopy after treatment of inflammatory changes is recommended as clinically warranted. 2.  Status post cholecystectomy. 3.  No small bowel or colonic obstruction. 4.  No hydronephrosis or hydroureter.  Original Report Authenticated By: Natasha Mead, M.D.     No diagnosis found.  Results for orders placed during the hospital encounter of 03/01/12  URINALYSIS, ROUTINE W REFLEX MICROSCOPIC      Component Value Range   Color, Urine YELLOW  YELLOW    APPearance CLEAR  CLEAR    Specific Gravity, Urine 1.010  1.005 - 1.030    pH 7.0  5.0 - 8.0  Glucose, UA NEGATIVE  NEGATIVE (mg/dL)   Hgb urine dipstick NEGATIVE  NEGATIVE    Bilirubin Urine NEGATIVE  NEGATIVE    Ketones, ur NEGATIVE  NEGATIVE (mg/dL)   Protein, ur NEGATIVE  NEGATIVE (mg/dL)   Urobilinogen, UA 0.2  0.0 - 1.0 (mg/dL)   Nitrite NEGATIVE  NEGATIVE    Leukocytes, UA NEGATIVE  NEGATIVE   CBC      Component Value Range   WBC 8.2  4.0 - 10.5 (K/uL)   RBC 4.23  3.87 - 5.11 (MIL/uL)   Hemoglobin 11.1 (*) 12.0 - 15.0 (g/dL)   HCT 16.1 (*) 09.6 - 46.0 (%)   MCV 80.6  78.0 - 100.0 (fL)   MCH 26.2  26.0 - 34.0 (pg)   MCHC 32.6  30.0 - 36.0 (g/dL)   RDW 04.5  40.9 - 81.1 (%)   Platelets 308  150 - 400 (K/uL)  DIFFERENTIAL      Component Value Range   Neutrophils Relative 64  43 - 77 (%)   Neutro Abs 5.3  1.7 - 7.7 (K/uL)   Lymphocytes Relative 28  12 - 46 (%)   Lymphs Abs 2.3  0.7 - 4.0 (K/uL)   Monocytes Relative 6  3 - 12 (%)   Monocytes Absolute 0.5  0.1 - 1.0 (K/uL)   Eosinophils Relative 2  0 - 5 (%)   Eosinophils Absolute 0.2  0.0 - 0.7 (K/uL)   Basophils Relative 0  0 - 1 (%)   Basophils Absolute 0.0  0.0 - 0.1 (K/uL)  COMPREHENSIVE METABOLIC  PANEL      Component Value Range   Sodium 138  135 - 145 (mEq/L)   Potassium 3.3 (*) 3.5 - 5.1 (mEq/L)   Chloride 99  96 - 112 (mEq/L)   CO2 28  19 - 32 (mEq/L)   Glucose, Bld 125 (*) 70 - 99 (mg/dL)   BUN 12  6 - 23 (mg/dL)   Creatinine, Ser 9.14  0.50 - 1.10 (mg/dL)   Calcium 9.7  8.4 - 78.2 (mg/dL)   Total Protein 7.3  6.0 - 8.3 (g/dL)   Albumin 3.4 (*) 3.5 - 5.2 (g/dL)   AST 34  0 - 37 (U/L)   ALT 29  0 - 35 (U/L)   Alkaline Phosphatase 116  39 - 117 (U/L)   Total Bilirubin 0.2 (*) 0.3 - 1.2 (mg/dL)   GFR calc non Af Amer >90  >90 (mL/min)   GFR calc Af Amer >90  >90 (mL/min)  LIPASE, BLOOD      Component Value Range   Lipase 51  11 - 59 (U/L)     Unable to obtain IV access. Patient has been taking her medications by mouth here without difficulty. Her white blood cell count is normal and she is afebrile. She states that she is normally able to be treated as an outpatient for her Crohn's. She'll be placed on Cipro, Flagyl, Percocet, and Phenergan. She is instructed to recall Dr. for tomorrow for recheck. I personally performed the services described in this documentation, which was scribed in my presence. The recorded information has been reviewed and considered.       Danielle Quarry, MD 03/01/12 1435

## 2012-03-01 NOTE — ED Notes (Signed)
Pt DC to home with steady gait 

## 2012-03-03 ENCOUNTER — Encounter (HOSPITAL_COMMUNITY): Payer: Self-pay

## 2012-03-03 ENCOUNTER — Emergency Department (HOSPITAL_COMMUNITY)
Admission: EM | Admit: 2012-03-03 | Discharge: 2012-03-03 | Disposition: A | Discharge status: Home / Self Care | Payer: Medicare Other | Attending: Emergency Medicine | Admitting: Emergency Medicine

## 2012-03-03 DIAGNOSIS — M545 Low back pain, unspecified: Secondary | ICD-10-CM | POA: Insufficient documentation

## 2012-03-03 DIAGNOSIS — E7800 Pure hypercholesterolemia, unspecified: Secondary | ICD-10-CM | POA: Insufficient documentation

## 2012-03-03 DIAGNOSIS — K5010 Crohn's disease of large intestine without complications: Secondary | ICD-10-CM

## 2012-03-03 DIAGNOSIS — F411 Generalized anxiety disorder: Secondary | ICD-10-CM | POA: Insufficient documentation

## 2012-03-03 DIAGNOSIS — K219 Gastro-esophageal reflux disease without esophagitis: Secondary | ICD-10-CM | POA: Insufficient documentation

## 2012-03-03 DIAGNOSIS — R110 Nausea: Secondary | ICD-10-CM | POA: Insufficient documentation

## 2012-03-03 DIAGNOSIS — I10 Essential (primary) hypertension: Secondary | ICD-10-CM | POA: Insufficient documentation

## 2012-03-03 DIAGNOSIS — F319 Bipolar disorder, unspecified: Secondary | ICD-10-CM | POA: Insufficient documentation

## 2012-03-03 DIAGNOSIS — M549 Dorsalgia, unspecified: Secondary | ICD-10-CM

## 2012-03-03 MED ORDER — PROMETHAZINE HCL 12.5 MG PO TABS
25.0000 mg | ORAL_TABLET | Freq: Once | ORAL | Status: AC
  Administered 2012-03-03: 25 mg via ORAL
  Filled 2012-03-03: qty 2

## 2012-03-03 MED ORDER — HYDROMORPHONE HCL PF 1 MG/ML IJ SOLN
2.0000 mg | Freq: Once | INTRAMUSCULAR | Status: AC
  Administered 2012-03-03: 2 mg via INTRAMUSCULAR
  Filled 2012-03-03: qty 2

## 2012-03-03 MED ORDER — OXYCODONE-ACETAMINOPHEN 5-325 MG PO TABS: 1.0000 | ORAL_TABLET | ORAL | Status: AC | PRN

## 2012-03-03 MED ORDER — OXYCODONE-ACETAMINOPHEN 5-325 MG PO TABS
1.0000 | ORAL_TABLET | Freq: Once | ORAL | Status: AC
  Administered 2012-03-03: 1 via ORAL
  Filled 2012-03-03: qty 1

## 2012-03-03 NOTE — ED Notes (Signed)
Pt reports severe back pain from an injury 2 weeks ago.  Pt seen in ed yesterday for same.

## 2012-03-03 NOTE — ED Notes (Signed)
Pt sitting up on side of bed, tolerating change in position well, drinking grape juice, tolerating po fluids well

## 2012-03-03 NOTE — ED Notes (Signed)
Pt requesting additional pain medication, Tammy PA notified,

## 2012-03-03 NOTE — ED Provider Notes (Signed)
History     CSN: [id number]  Arrival date & time 03/03/12  4098   First MD Initiated Contact with Patient 03/03/12 [phone]      Chief Complaint  Patient presents with  . Back Pain    (Consider location/radiation/quality/duration/timing/severity/associated sxs/prior treatment) HPI  Past Medical History  Diagnosis Date  . Hypertension   . Bipolar 1 disorder   . Depression   . Seizures   . Thyroid disease   . Hypercholesteremia   . Acid reflux   . Chronic pain   . Stomach ulcer   . Headache   . Anxiety   . Crohn's disease     Past Surgical History  Procedure Date  . Cholecystectomy   . Tonsillectomy   . Stomach surgery     Family History  Problem Relation Age of Onset  . Hypertension Father   . Diabetes type II Brother   . Hypertension Brother   . Diabetes type II Mother     History  Substance Use Topics  . Smoking status: Never Smoker   . Smokeless tobacco: Not on file  . Alcohol Use: No    OB History    Grav Para Term Preterm Abortions TAB SAB Ect Mult Living                  Review of Systems  Allergies  Compazine; Aspirin; Cephalexin; Erythromycin; Metoclopramide hcl; Ondansetron hcl; Penicillins; Sumatriptan; Tetracycline; and Toradol  Home Medications   Current Outpatient Rx  Name Route Sig Dispense Refill  . ACETAMINOPHEN 500 MG PO TABS Oral Take 500 mg by mouth every 6 (six) hours as needed. pain    . ALPRAZOLAM 1 MG PO TABS Oral Take 1 mg by mouth 4 (four) times daily.     . ARIPIPRAZOLE 10 MG PO TABS Oral Take 10 mg by mouth at bedtime.     Marland Kitchen CIPROFLOXACIN HCL 500 MG PO TABS Oral Take 1 tablet (500 mg total) by mouth every 12 (twelve) hours. 10 tablet 0  . CLONIDINE HCL 0.1 MG PO TABS Oral Take 1 tablet (0.1 mg total) by mouth 2 (two) times daily. 1 tablet 0    Hold for 1 week then resume  . DULOXETINE HCL 60 MG PO CPEP Oral Take 60 mg by mouth daily.     Marland Kitchen LABETALOL HCL 200 MG PO TABS Oral Take 1 tablet (200 mg total) by mouth 2 (two)  times daily. 1 tablet 0    Hold for 1 week then resume  . LEVETIRACETAM 500 MG PO TABS Oral Take 500-1,000 mg by mouth 2 (two) times daily. Take 1 by mouth in the morning and take 2 at bedtime    . LUBIPROSTONE 8 MCG PO CAPS Oral Take 8 mcg by mouth 2 (two) times daily with a meal.     . METRONIDAZOLE 500 MG PO TABS Oral Take 1 tablet (500 mg total) by mouth 2 (two) times daily. 14 tablet 0  . OMEPRAZOLE 40 MG PO CPDR Oral Take 40 mg by mouth daily.     Marland Kitchen PREDNISONE 10 MG PO TABS Oral Take 2 tablets (20 mg total) by mouth daily. 15 tablet 0  . PROMETHAZINE HCL 25 MG PO TABS Oral Take 1 tablet (25 mg total) by mouth every 6 (six) hours as needed for nausea. 15 tablet 0  . SIMVASTATIN 20 MG PO TABS Oral Take 20 mg by mouth at bedtime.     Marland Kitchen ZOLPIDEM TARTRATE 10 MG PO TABS Oral  Take 10 mg by mouth at bedtime as needed. For sleep       BP 201/118  Pulse 105  Temp(Src) 97.8 F (36.6 C) (Oral)  Resp 17  SpO2 98%  Physical Exam  ED Course  Procedures (including critical care time)  Labs Reviewed - No data to display Ct Abdomen Pelvis Wo Contrast  03/01/2012  *RADIOLOGY REPORT*  Clinical Data: right lower quadrant pain, status post cholecystectomy prior gastric surgery for ulcer  CT ABDOMEN AND PELVIS WITHOUT CONTRAST  Technique:  Multidetector CT imaging of the abdomen and pelvis was performed following the standard protocol without intravenous contrast.  Comparison: 11/10/2011  Findings: Lung bases are unremarkable.  Sagittal images of the spine shows no destructive bony lesions.  Post surgical changes are noted the distal gastric region. Surgical clips are noted in the right upper abdomen.  The patient is status post cholecystectomy.  The study is limited without IV contrast.  The unenhanced liver, spleen, pancreas and adrenal glands are unremarkable.  Unenhanced kidneys are symmetrical in size.  No nephrolithiasis.  No hydronephrosis or hydroureter.  No calcified ureteral calculi are noted.  No  aortic aneurysm.  Oral contrast material was given to the patient.  There is significant thickening of the cecal wall.  There is thickening of the wall of the terminal ileum best seen in axial images 69.  Mild stranding of the pericolonic fat in the right lower quadrant of the abdomen.  The appendix is not identified.  A lymph node in the right lower quadrant mesentery axial image 64 measures 8 mm probable reactive in nature.  The findings are highly suspicious for Crohn's disease or  colitis.  No definite neoplastic process is noted on this unenhanced scan.  No mesenteric abscess is noted.  Surgical clip is noted in the right lower quadrant mesentery medially.  No small bowel or colonic obstruction.  Some stool noted in the rectosigmoid colon and left colon.  The unenhanced uterus and adnexa are unremarkable.  IMPRESSION:  1.  There is inflammatory process in the right lower quadrant of the abdomen with significant thickening of the cecal wall.  There is thickening of the wall of the terminal ileum.  Findings are highly suspicious for Crohn's disease or colitis.  The appendix is not identified.  No definite neoplastic process is identified on this unenhanced scan.  Follow-up colonoscopy after treatment of inflammatory changes is recommended as clinically warranted. 2.  Status post cholecystectomy. 3.  No small bowel or colonic obstruction. 4.  No hydronephrosis or hydroureter.  Original Report Authenticated By: Natasha Mead, M.D.        MDM   I have reviewed previous ED chart , laboratory studies and imaging.   Vital signs are stable. Patient is feeling better.  Patient is tolerating oral fluids without difficulty. No vomiting during ED stay.  tenderness to palpation of the lumbar paraspinal muscles. No focal neuro deficits on exam. She ambulates without difficulty. No CVA tenderness or abdominal tenderness.  Patient is currently taking prednisone, Cipro, Flagyl, and Phenergan for nausea. I will prescribe  a short course of pain medication     Patient / Family / Caregiver understand and agree with initial ED impression and plan with expectations set for ED visit. Pt stable in ED with no significant deterioration in condition. Pt feels improved after observation and/or treatment in ED.    Carianna Lague L. Linas Stepter, [place] 03/08/12 1344

## 2012-03-03 NOTE — Discharge Instructions (Signed)
Back Pain, Adult Back pain is very common. The pain often gets better over time. The cause of back pain is usually not dangerous. Most people can learn to manage their back pain on their own.  HOME CARE   Stay active. Start with short walks on flat ground if you can. Try to walk farther each day.   Do not sit, drive, or stand in one place for more than 30 minutes. Do not stay in bed.   Do not avoid exercise or work. Activity can help your back heal faster.   Be careful when you bend or lift an object. Bend at your knees, keep the object close to you, and do not twist.   Sleep on a firm mattress. Lie on your side, and bend your knees. If you lie on your back, put a pillow under your knees.   Only take medicines as told by your doctor.   Put ice on the injured area.   Put ice in a plastic bag.   Place a towel between your skin and the bag.   Leave the ice on for 15 to 20 minutes, 3 to 4 times a day for the first 2 to 3 days. After that, you can switch between ice and heat packs.   Ask your doctor about back exercises or massage.   Avoid feeling anxious or stressed. Find good ways to deal with stress, such as exercise.  GET HELP RIGHT AWAY IF:   Your pain does not go away with rest or medicine.   Your pain does not go away in 1 week.   You have new problems.   You do not feel well.   The pain spreads into your legs.   You cannot control when you poop (bowel movement) or pee (urinate).   Your arms or legs feel weak or lose feeling (numbness).   You feel sick to your stomach (nauseous) or throw up (vomit).   You have belly (abdominal) pain.   You feel like you may pass out (faint).  MAKE SURE YOU:   Understand these instructions.   Will watch your condition.   Will get help right away if you are not doing well or get worse.  Document Released: 05/04/2008 Document Revised: 11/05/2011 Document Reviewed: 04/06/2011 [facility] Patient Information 2012 [place],  [place].Crohn's Disease Crohn's disease is a long-term (chronic) soreness and redness (inflammation) of the intestines (bowel). It can affect any portion of the digestive tract, from the mouth to the anus. It can also cause problems outside the digestive tract. Crohn's disease is closely related to a disease called ulcerative colitis (together, these two diseases are called inflammatory bowel disease).  CAUSES  The cause of Crohn's disease is not known. One Nelva Bush is that, in an easily affected (susceptible) person, the immune system is triggered to attack the body's own digestive tissue. Crohn's disease runs in families. It seems to be more common in certain geographic areas and amongst certain races. There are no clear-cut dietary causes.  SYMPTOMS  Crohn's disease can cause many different symptoms since it can affect many different parts of the body. Symptoms include:  Fatigue.   Weight loss.   Chronic diarrhea, sometime bloody.   Abdominal pain and cramps.   Fever.   Ulcers or canker sores in the mouth or rectum.   Anemia (low red blood cells).   Arthritis, skin problems, and eye problems may occur.  Complications of Crohn's disease can include:  Series of holes (perforation) of the  bowel.   Portions of the intestines sticking to each other (adhesions).   Obstruction of the bowel.   Fistula formation, typically in the rectal area but also in other areas. A fistula is an opening between the bowels and the outside, or between the bowels and another organ.   A painful crack in the mucous membrane of the anus (rectal fissure).  DIAGNOSIS  Your caregiver may suspect Crohn's disease based on your symptoms and an exam. Blood tests may confirm that there is a problem. You may be asked to submit a stool specimen for examination. X-rays and CT scans may be necessary. Ultimately, the diagnosis is usually made after a procedure that uses a flexible tube that is inserted via your mouth or your  anus. This is done under sedation and is called either an upper endoscopy or colonoscopy. With these tests, the specialist can take tiny tissue samples and remove them from the inside of the bowel (biopsy). Examination of this biopsy tissue under a microscope can reveal Crohn's disease as the cause of your symptoms. Due to the many different forms that Crohn's disease can take, symptoms may be present for several years before a diagnosis is made. HOME CARE INSTRUCTIONS   There is no cure for Crohn's disease. The best treatment is frequent checkups with your caregiver.   Symptoms such as diarrhea can be controlled with medications. Avoid foods that have a laxative effect such as fresh fruit, vegetables and dairy products. During flare ups, you can rest your bowel by refraining from solid foods. Drink clear liquids frequently during the day (electrolyte or re-hydrating fluids are best. Your caregiver can help you with suggestions). Drink often to prevent loss of body fluids (dehydration). When diarrhea has cleared, eat small meals and more frequently. Avoid food additives and stimulants such as caffeine (coffee, tea, or chocolate). Enzyme supplements may help if you develop intolerance to a sugar in dairy products (lactose). Ask your caregiver or dietitian about specific dietary instructions.   Try to maintain a positive attitude. Learn relaxation techniques such as self hypnosis, mental imaging, and muscle relaxation.   If possible, avoid stresses which can aggravate your condition.   Exercise regularly.   Follow your diet.   Always get plenty of rest.  SEEK MEDICAL CARE IF:   Your symptoms fail to improve after a week or two of new treatment.   You experience continued weight loss.   You have ongoing crampy digestion or loose bowels.   You develop a new skin rash, skin sores, or eye problems.  SEEK IMMEDIATE MEDICAL CARE IF:   You have worsening of your symptoms or develop new symptoms.     You have a fever.   You develop bloody diarrhea.   You develop severe abdominal pain.  MAKE SURE YOU:   Understand these instructions.   Will watch your condition.   Will get help right away if you are not doing well or get worse.  Document Released: 08/26/2005 Document Revised: 11/05/2011 Document Reviewed: 07/25/2007 [facility] Patient Information 2012 [place], [place].

## 2012-03-03 NOTE — ED Notes (Signed)
C/o lower back pain,  

## 2012-03-03 NOTE — ED Notes (Signed)
MD at bedside. 

## 2012-03-07 NOTE — ED Provider Notes (Signed)
History     CSN: [id number]  Arrival date & time 03/03/12  5784   First MD Initiated Contact with Patient 03/03/12 [phone]      Chief Complaint  Patient presents with  . Back Pain    (Consider location/radiation/quality/duration/timing/severity/associated sxs/prior treatment) HPI Comments: Patient c/o persistent lower back pain for 2 weeks since a recent injury.  PAtient was seen here tow days prior for back pain and abdominal pain.  States she was given antibiotics for her abdominal pain but did not receive pain medication for her back.  States she is taking the medication as directed, she is still having some nausea but staes her abdominal pain is improving. She denies numbness, weakness of her legs, perineal numbness, incontinence of urine or feces. Or dysuria.   Patient is a 51 y.o. female presenting with back pain. The history is provided by the patient.  Back Pain  This is a chronic problem. The current episode started more than 1 week ago. The problem occurs constantly. The problem has not changed since onset.The pain is associated with a recent injury. The pain is present in the lumbar spine. The quality of the pain is described as aching. The pain does not radiate. The pain is moderate. The symptoms are aggravated by bending, twisting and certain positions. The pain is the same all the time. Pertinent negatives include no fever, no numbness, no abdominal pain, no bowel incontinence, no perianal numbness, no bladder incontinence, no dysuria, no pelvic pain, no leg pain, no paresthesias, no paresis, no tingling and no weakness. She has tried analgesics for the symptoms. The treatment provided no relief.    Past Medical History  Diagnosis Date  . Hypertension   . Bipolar 1 disorder   . Depression   . Seizures   . Thyroid disease   . Hypercholesteremia   . Acid reflux   . Chronic pain   . Stomach ulcer   . Headache   . Anxiety   . Crohn's disease     Past Surgical History    Procedure Date  . Cholecystectomy   . Tonsillectomy   . Stomach surgery     Family History  Problem Relation Age of Onset  . Hypertension Father   . Diabetes type II Brother   . Hypertension Brother   . Diabetes type II Mother     History  Substance Use Topics  . Smoking status: Never Smoker   . Smokeless tobacco: Not on file  . Alcohol Use: No    OB History    Grav Para Term Preterm Abortions TAB SAB Ect Mult Living                  Review of Systems  Constitutional: Negative for fever.  Gastrointestinal: Positive for nausea. Negative for abdominal pain and bowel incontinence.  Genitourinary: Negative for bladder incontinence, dysuria, hematuria, flank pain, vaginal bleeding, vaginal discharge, difficulty urinating and pelvic pain.  Musculoskeletal: Positive for back pain. Negative for gait problem.  Skin: Negative.   Neurological: Negative for dizziness, tingling, weakness, numbness and paresthesias.  All other systems reviewed and are negative.    Allergies  Compazine; Aspirin; Cephalexin; Erythromycin; Metoclopramide hcl; Ondansetron hcl; Penicillins; Sumatriptan; Tetracycline; and Toradol  Home Medications   Current Outpatient Rx  Name Route Sig Dispense Refill  . ACETAMINOPHEN 500 MG PO TABS Oral Take 500 mg by mouth every 6 (six) hours as needed. pain    . ALPRAZOLAM 1 MG PO TABS Oral Take 1  mg by mouth 4 (four) times daily.     . ARIPIPRAZOLE 10 MG PO TABS Oral Take 10 mg by mouth at bedtime.     Marland Kitchen CIPROFLOXACIN HCL 500 MG PO TABS Oral Take 1 tablet (500 mg total) by mouth every 12 (twelve) hours. 10 tablet 0  . CLONIDINE HCL 0.1 MG PO TABS Oral Take 1 tablet (0.1 mg total) by mouth 2 (two) times daily. 1 tablet 0    Hold for 1 week then resume  . DULOXETINE HCL 60 MG PO CPEP Oral Take 60 mg by mouth daily.     Marland Kitchen LABETALOL HCL 200 MG PO TABS Oral Take 1 tablet (200 mg total) by mouth 2 (two) times daily. 1 tablet 0    Hold for 1 week then resume  .  LEVETIRACETAM 500 MG PO TABS Oral Take 500-1,000 mg by mouth 2 (two) times daily. Take 1 by mouth in the morning and take 2 at bedtime    . LUBIPROSTONE 8 MCG PO CAPS Oral Take 8 mcg by mouth 2 (two) times daily with a meal.     . METRONIDAZOLE 500 MG PO TABS Oral Take 1 tablet (500 mg total) by mouth 2 (two) times daily. 14 tablet 0  . OMEPRAZOLE 40 MG PO CPDR Oral Take 40 mg by mouth daily.     Marland Kitchen PREDNISONE 10 MG PO TABS Oral Take 2 tablets (20 mg total) by mouth daily. 15 tablet 0  . PROMETHAZINE HCL 25 MG PO TABS Oral Take 1 tablet (25 mg total) by mouth every 6 (six) hours as needed for nausea. 15 tablet 0  . SIMVASTATIN 20 MG PO TABS Oral Take 20 mg by mouth at bedtime.     Marland Kitchen ZOLPIDEM TARTRATE 10 MG PO TABS Oral Take 10 mg by mouth at bedtime as needed. For sleep       BP 201/118  Pulse 105  Temp(Src) 97.8 F (36.6 C) (Oral)  Resp 17  SpO2 98%  Physical Exam  Nursing note and vitals reviewed. Constitutional: She is oriented to person, place, and time. She appears well-developed and well-nourished. No distress.  HENT:  Head: Normocephalic and atraumatic.  Mouth/Throat: Oropharynx is clear and moist.  Neck: Normal range of motion. Neck supple.  Cardiovascular: Normal rate, regular rhythm and normal heart sounds.   No murmur heard. Pulmonary/Chest: Effort normal and breath sounds normal. No respiratory distress. She exhibits no tenderness.  Abdominal: Soft. She exhibits no distension and no mass. There is no tenderness. There is no rebound and no guarding.  Musculoskeletal: Normal range of motion. She exhibits no tenderness.       Lumbar back: She exhibits tenderness and pain. She exhibits normal range of motion, no swelling, no edema, no spasm and normal pulse.       Back:  Lymphadenopathy:    She has no cervical adenopathy.  Neurological: She is alert and oriented to person, place, and time. No cranial nerve deficit or sensory deficit. She exhibits normal muscle tone.  Coordination normal.  Reflex Scores:      Patellar reflexes are 2+ on the right side and 2+ on the left side.      Achilles reflexes are 2+ on the right side and 2+ on the left side. Skin: Skin is warm and dry.    ED Course  Procedures (including critical care time)  Labs Reviewed - No data to display Ct Abdomen Pelvis Wo Contrast  03/01/2012  *RADIOLOGY REPORT*  Clinical Data: right  lower quadrant pain, status post cholecystectomy prior gastric surgery for ulcer  CT ABDOMEN AND PELVIS WITHOUT CONTRAST  Technique:  Multidetector CT imaging of the abdomen and pelvis was performed following the standard protocol without intravenous contrast.  Comparison: 11/10/2011  Findings: Lung bases are unremarkable.  Sagittal images of the spine shows no destructive bony lesions.  Post surgical changes are noted the distal gastric region. Surgical clips are noted in the right upper abdomen.  The patient is status post cholecystectomy.  The study is limited without IV contrast.  The unenhanced liver, spleen, pancreas and adrenal glands are unremarkable.  Unenhanced kidneys are symmetrical in size.  No nephrolithiasis.  No hydronephrosis or hydroureter.  No calcified ureteral calculi are noted.  No aortic aneurysm.  Oral contrast material was given to the patient.  There is significant thickening of the cecal wall.  There is thickening of the wall of the terminal ileum best seen in axial images 69.  Mild stranding of the pericolonic fat in the right lower quadrant of the abdomen.  The appendix is not identified.  A lymph node in the right lower quadrant mesentery axial image 64 measures 8 mm probable reactive in nature.  The findings are highly suspicious for Crohn's disease or  colitis.  No definite neoplastic process is noted on this unenhanced scan.  No mesenteric abscess is noted.  Surgical clip is noted in the right lower quadrant mesentery medially.  No small bowel or colonic obstruction.  Some stool noted in the  rectosigmoid colon and left colon.  The unenhanced uterus and adnexa are unremarkable.  IMPRESSION:  1.  There is inflammatory process in the right lower quadrant of the abdomen with significant thickening of the cecal wall.  There is thickening of the wall of the terminal ileum.  Findings are highly suspicious for Crohn's disease or colitis.  The appendix is not identified.  No definite neoplastic process is identified on this unenhanced scan.  Follow-up colonoscopy after treatment of inflammatory changes is recommended as clinically warranted. 2.  Status post cholecystectomy. 3.  No small bowel or colonic obstruction. 4.  No hydronephrosis or hydroureter.  Original Report Authenticated By: Natasha Mead, M.D.        MDM   I have reviewed previous ED chart , laboratory studies and imaging.   Vital signs are stable. Patient is feeling better.  Patient is tolerating oral fluids without difficulty. No vomiting during ED stay.  tenderness to palpation of the lumbar paraspinal muscles. No focal neuro deficits on exam. She ambulates without difficulty. No CVA tenderness or abdominal tenderness.  Patient is currently taking prednisone, Cipro, Flagyl, and Phenergan for nausea and Crohn's. I will prescribe a short course of pain medication     Patient / Family / Caregiver understand and agree with initial ED impression and plan with expectations set for ED visit. Pt stable in ED with no significant deterioration in condition. Pt feels improved after observation and/or treatment in ED.    Nikesha Kwasny L. Manoj Enriquez, [place] 03/08/12 1343

## 2012-03-11 NOTE — ED Provider Notes (Signed)
Medical screening examination/treatment/procedure(s) were performed by non-physician practitioner and as supervising physician I was immediately available for consultation/collaboration.   [name], MD 03/11/12 [phone]

## 2012-03-11 NOTE — ED Provider Notes (Signed)
Medical screening examination/treatment/procedure(s) were performed by non-physician practitioner and as supervising physician I was immediately available for consultation/collaboration.   Khalila Buechner L Vian Fluegel, MD 03/11/12 1815 

## 2012-03-15 ENCOUNTER — Encounter (HOSPITAL_COMMUNITY): Payer: Self-pay | Admitting: Emergency Medicine

## 2012-03-15 ENCOUNTER — Emergency Department (HOSPITAL_COMMUNITY)
Admission: EM | Admit: 2012-03-15 | Discharge: 2012-03-15 | Disposition: A | Discharge status: Home / Self Care | Payer: Medicare Other | Attending: Emergency Medicine | Admitting: Emergency Medicine

## 2012-03-15 DIAGNOSIS — G8929 Other chronic pain: Secondary | ICD-10-CM

## 2012-03-15 DIAGNOSIS — M549 Dorsalgia, unspecified: Secondary | ICD-10-CM | POA: Insufficient documentation

## 2012-03-15 MED ORDER — HYDROCODONE-ACETAMINOPHEN 5-325 MG PO TABS: 1.0000 | ORAL_TABLET | ORAL | Status: AC | PRN

## 2012-03-15 MED ORDER — HYDROMORPHONE HCL PF 1 MG/ML IJ SOLN
1.0000 mg | Freq: Once | INTRAMUSCULAR | Status: AC
  Administered 2012-03-15: 1 mg via INTRAMUSCULAR
  Filled 2012-03-15: qty 1

## 2012-03-15 MED ORDER — CYCLOBENZAPRINE HCL 10 MG PO TABS: 10.0000 mg | ORAL_TABLET | Freq: Three times a day (TID) | ORAL | Status: AC | PRN

## 2012-03-15 MED ORDER — PROMETHAZINE HCL 25 MG/ML IJ SOLN
25.0000 mg | Freq: Once | INTRAMUSCULAR | Status: AC
  Administered 2012-03-15: 25 mg via INTRAMUSCULAR
  Filled 2012-03-15: qty 1

## 2012-03-15 NOTE — ED Notes (Signed)
Pt reports back pain that has not improved since seen here a couple weeks ago.

## 2012-03-15 NOTE — ED Provider Notes (Signed)
History  This chart was scribed for Carleene Cooper III, MD by Bennett Scrape and Temilola Ajibulu. This patient was seen in room APA03/APA03 and the patient's care was started at 10:33PM.  CSN: [id number]  Arrival date & time 03/15/12  4098   First MD Initiated Contact with Patient 03/15/12 2230      Chief Complaint  Patient presents with  . Back Pain    The history is provided by the patient. No language interpreter was used.     [name] is a 51 y.o. female who presents to the Emergency Department complaining of 2 weeks of gradual onset, gradually worsening, constant lower back pain that radiates to her upper abdomen with associated HA, nausea and emesis. The back pain is described as aching. She reports several episodes of non-bloody emesis. Pt claims no injury but reports that she has been doing some light furniture moving. Pt was seen in the ED 2 weeks ago for back pain and states that it has not improved since. She denies any modifying factors. Pt denies dysuria, neck pain, vomiting, congestion, SOB, visual disturbance, agitation, adenopathy, syncope, join swelling, palpitations and chills as associated symptoms. Pt reports being given a shot in her back to relieve her past symptoms but reports not taking any medication to relieve her current symptoms. Pt has ah/o HTN, seizures, HA and chronic pain. Pt denies a h/o smoking and alcohol use.   Pt's PCP is Environmental health practitioner, a PA with [facility].   Past Medical History  Diagnosis Date  . Hypertension   . Bipolar 1 disorder   . Depression   . Seizures   . Thyroid disease   . Hypercholesteremia   . Acid reflux   . Chronic pain   . Stomach ulcer   . Headache   . Anxiety   . Crohn's disease     Past Surgical History  Procedure Date  . Cholecystectomy   . Tonsillectomy   . Stomach surgery     Family History  Problem Relation Age of Onset  . Hypertension Father   . Diabetes type II Brother   . Hypertension  Brother   . Diabetes type II Mother     History  Substance Use Topics  . Smoking status: Never Smoker   . Smokeless tobacco: Not on file  . Alcohol Use: No    Review of Systems  A complete 10 system review of systems was obtained and all systems are negative except as noted in the HPI and PMH.   Allergies  Compazine; Aspirin; Cephalexin; Erythromycin; Metoclopramide hcl; Ondansetron hcl; Penicillins; Sumatriptan; Tetracycline; and Toradol  Home Medications   Current Outpatient Rx  Name Route Sig Dispense Refill  . ACETAMINOPHEN 500 MG PO TABS Oral Take 500 mg by mouth every 6 (six) hours as needed. pain    . ALPRAZOLAM 1 MG PO TABS Oral Take 1 mg by mouth 4 (four) times daily.     . ARIPIPRAZOLE 10 MG PO TABS Oral Take 10 mg by mouth at bedtime.     Marland Kitchen CLONIDINE HCL 0.1 MG PO TABS Oral Take 1 tablet (0.1 mg total) by mouth 2 (two) times daily. 1 tablet 0    Hold for 1 week then resume  . DULOXETINE HCL 60 MG PO CPEP Oral Take 60 mg by mouth daily.     Marland Kitchen LABETALOL HCL 200 MG PO TABS Oral Take 1 tablet (200 mg total) by mouth 2 (two) times daily. 1 tablet 0  Hold for 1 week then resume  . LEVETIRACETAM 500 MG PO TABS Oral Take 500-1,000 mg by mouth 2 (two) times daily. Take 1 by mouth in the morning and take 2 at bedtime    . LUBIPROSTONE 8 MCG PO CAPS Oral Take 8 mcg by mouth 2 (two) times daily with a meal.     . OMEPRAZOLE 40 MG PO CPDR Oral Take 40 mg by mouth daily.     Marland Kitchen PROMETHAZINE HCL 25 MG PO TABS Oral Take 1 tablet (25 mg total) by mouth every 6 (six) hours as needed for nausea. 15 tablet 0  . SIMVASTATIN 20 MG PO TABS Oral Take 20 mg by mouth at bedtime.     Marland Kitchen ZOLPIDEM TARTRATE 10 MG PO TABS Oral Take 10 mg by mouth at bedtime as needed. For sleep       Triage Vitals: BP 161/103  Pulse 90  Temp(Src) 97.9 F (36.6 C) (Oral)  Resp 18  Ht 5' 4.5" (1.638 m)  Wt 200 lb (90.719 kg)  BMI 33.80 kg/m2  SpO2 98%  Physical Exam  Nursing note and vitals  reviewed. Constitutional: She is oriented to person, place, and time. She appears well-developed and well-nourished.  HENT:  Head: Normocephalic and atraumatic.  Eyes: Conjunctivae and EOM are normal.  Neck: Normal range of motion. Neck supple.  Cardiovascular: Normal rate, regular rhythm and normal heart sounds.   Pulmonary/Chest: Effort normal and breath sounds normal.  Abdominal: Soft. Bowel sounds are normal. There is no tenderness.  Musculoskeletal: She exhibits tenderness (lumbar region to L1, L2, L3 and L4, more tenderness is noted to the left ). She exhibits no edema.  Neurological: She is alert and oriented to person, place, and time.  Skin: Skin is warm and dry.  Psychiatric: She has a normal mood and affect. Her behavior is normal.    ED Course  Procedures (including critical care time)  DIAGNOSTIC STUDIES: Oxygen Saturation is 98% on room air, normal by my interpretation.    COORDINATION OF CARE: 10:40PM- Will give pt a shot of pain medication before discharging her. Will prescription pain pills and muscle relaxants and pt agreed.   1. Chronic back pain      I personally performed the services described in this documentation, which was scribed in my presence. The recorded information has been reviewed and considered.  Osvaldo Human, M.D.  Carleene Cooper III, MD 03/17/12 1153

## 2012-03-15 NOTE — ED Notes (Signed)
Pt with lower back pain since 2 weeks ago.  Seen here previous for the same.  Denies vomiting, states she does have periods of nausea.

## 2012-03-15 NOTE — ED Notes (Signed)
Pt alert & oriented x4, stable gait. Pt given discharge instructions, paperwork & prescription(s). Patient instructed to stop at the registration desk to finish any additional paperwork. pt verbalized understanding. Pt left department w/ no further questions.  

## 2012-03-25 ENCOUNTER — Encounter (HOSPITAL_COMMUNITY): Payer: Self-pay

## 2012-03-25 ENCOUNTER — Emergency Department (HOSPITAL_COMMUNITY)
Admission: EM | Admit: 2012-03-25 | Discharge: 2012-03-25 | Disposition: A | Discharge status: Home / Self Care | Payer: Medicare Other | Attending: Emergency Medicine | Admitting: Emergency Medicine

## 2012-03-25 DIAGNOSIS — R509 Fever, unspecified: Secondary | ICD-10-CM | POA: Insufficient documentation

## 2012-03-25 DIAGNOSIS — M545 Low back pain, unspecified: Secondary | ICD-10-CM | POA: Insufficient documentation

## 2012-03-25 DIAGNOSIS — I10 Essential (primary) hypertension: Secondary | ICD-10-CM | POA: Insufficient documentation

## 2012-03-25 DIAGNOSIS — R3919 Other difficulties with micturition: Secondary | ICD-10-CM | POA: Insufficient documentation

## 2012-03-25 LAB — URINALYSIS, ROUTINE W REFLEX MICROSCOPIC
Glucose, UA: NEGATIVE mg/dL
Leukocytes, UA: NEGATIVE
Nitrite: NEGATIVE
Protein, ur: NEGATIVE mg/dL
pH: 7 (ref 5.0–8.0)

## 2012-03-25 MED ORDER — HYDROCODONE-ACETAMINOPHEN 5-325 MG PO TABS
1.0000 | ORAL_TABLET | Freq: Once | ORAL | Status: AC
  Administered 2012-03-25: 1 via ORAL
  Filled 2012-03-25: qty 1

## 2012-03-25 MED ORDER — HYDROCODONE-ACETAMINOPHEN 5-325 MG PO TABS: 1.0000 | ORAL_TABLET | Freq: Four times a day (QID) | ORAL | Status: AC | PRN

## 2012-03-25 NOTE — ED Provider Notes (Signed)
History     CSN: [id number]  Arrival date & time 03/25/12  1534   First MD Initiated Contact with Patient 03/25/12 1712      Chief Complaint  Patient presents with  . Back Pain    (Consider location/radiation/quality/duration/timing/severity/associated sxs/prior treatment) HPI Comments: Pt states she was hospitalized for "kidney infection" and released 3 days ago.   She doesn't know if she was admitted here or at [facility].     She continues to have "dark urine that smells bad" and back pain.  She has run out of pain medicine.    After review of record it appears that she was not admitted here.  She does not know the name of her antibiotic, pain medicine here PCP in ? [place] or the name of the MD she was told to follow up with.  She has had a subjective fever.  Patient is a 51 y.o. female presenting with back pain. The history is provided by the patient. No language interpreter was used.  Back Pain  This is a new problem. The problem occurs constantly. The problem has not changed since onset.The pain is associated with no known injury. The pain is present in the lumbar spine. Quality: "dull" The pain does not radiate. The pain is severe. The pain is the same all the time. Associated symptoms include a fever. Pertinent negatives include no dysuria. She has tried analgesics (antibiotic) for the symptoms.    Past Medical History  Diagnosis Date  . Hypertension   . Bipolar 1 disorder   . Depression   . Seizures   . Thyroid disease   . Hypercholesteremia   . Acid reflux   . Chronic pain   . Stomach ulcer   . Headache   . Anxiety   . Crohn's disease     Past Surgical History  Procedure Date  . Cholecystectomy   . Tonsillectomy   . Stomach surgery     Family History  Problem Relation Age of Onset  . Hypertension Father   . Diabetes type II Brother   . Hypertension Brother   . Diabetes type II Mother     History  Substance Use Topics  . Smoking status: Never Smoker   .  Smokeless tobacco: Not on file  . Alcohol Use: No    OB History    Grav Para Term Preterm Abortions TAB SAB Ect Mult Living                  Review of Systems  Constitutional: Positive for fever. Negative for chills.  Genitourinary: Negative for dysuria, urgency and frequency.       Dark, malodorous urine  Musculoskeletal: Positive for back pain.  All other systems reviewed and are negative.    Allergies  Compazine; Aspirin; Cephalexin; Erythromycin; Metoclopramide hcl; Ondansetron hcl; Penicillins; Sumatriptan; Tetracycline; and Toradol  Home Medications   Current Outpatient Rx  Name Route Sig Dispense Refill  . ACETAMINOPHEN 500 MG PO TABS Oral Take 500 mg by mouth every 6 (six) hours as needed. pain    . ALPRAZOLAM 1 MG PO TABS Oral Take 1 mg by mouth 4 (four) times daily.     . ARIPIPRAZOLE 10 MG PO TABS Oral Take 10 mg by mouth at bedtime.     Marland Kitchen CLONIDINE HCL 0.1 MG PO TABS Oral Take 1 tablet (0.1 mg total) by mouth 2 (two) times daily. 1 tablet 0    Hold for 1 week then resume  . CYCLOBENZAPRINE  HCL 10 MG PO TABS Oral Take 1 tablet (10 mg total) by mouth 3 (three) times daily as needed for muscle spasms. 15 tablet 0  . DULOXETINE HCL 60 MG PO CPEP Oral Take 60 mg by mouth daily.     Marland Kitchen HYDROCODONE-ACETAMINOPHEN 5-325 MG PO TABS Oral Take 1 tablet by mouth every 4 (four) hours as needed for pain. 20 tablet 0  . LABETALOL HCL 200 MG PO TABS Oral Take 1 tablet (200 mg total) by mouth 2 (two) times daily. 1 tablet 0    Hold for 1 week then resume  . LEVETIRACETAM 500 MG PO TABS Oral Take 500-1,000 mg by mouth 2 (two) times daily. Take 1 by mouth in the morning and take 2 at bedtime    . LUBIPROSTONE 8 MCG PO CAPS Oral Take 8 mcg by mouth 2 (two) times daily with a meal.     . OMEPRAZOLE 40 MG PO CPDR Oral Take 40 mg by mouth daily.     Marland Kitchen SIMVASTATIN 20 MG PO TABS Oral Take 20 mg by mouth at bedtime.     Marland Kitchen ZOLPIDEM TARTRATE 10 MG PO TABS Oral Take 10 mg by mouth at bedtime as  needed. For sleep     . HYDROCODONE-ACETAMINOPHEN 5-325 MG PO TABS Oral Take 1 tablet by mouth every 6 (six) hours as needed for pain. 12 tablet 0  . PROMETHAZINE HCL 25 MG PO TABS Oral Take 1 tablet (25 mg total) by mouth every 6 (six) hours as needed for nausea. 15 tablet 0    BP 170/110  Pulse 99  Temp(Src) 97.3 F (36.3 C) (Oral)  Resp 20  Ht 5' 4.5" (1.638 m)  Wt 200 lb (90.719 kg)  BMI 33.80 kg/m2  SpO2 98%  Physical Exam  Nursing note and vitals reviewed. Constitutional: She is oriented to person, place, and time. She appears well-developed and well-nourished. No distress.  HENT:  Head: Normocephalic and atraumatic.  Eyes: EOM are normal.  Neck: Normal range of motion.  Cardiovascular: Normal rate, regular rhythm and normal heart sounds.   Pulmonary/Chest: Effort normal and breath sounds normal.  Abdominal: Soft. She exhibits no distension. There is no tenderness.  Musculoskeletal: She exhibits tenderness.       Lumbar back: She exhibits decreased range of motion, tenderness, bony tenderness and pain. She exhibits no deformity and no laceration.       Back:  Neurological: She is alert and oriented to person, place, and time.  Skin: Skin is warm and dry.  Psychiatric: She has a normal mood and affect. Judgment normal.    ED Course  Procedures (including critical care time)  Labs Reviewed  URINALYSIS, ROUTINE W REFLEX MICROSCOPIC - Abnormal; Notable for the following:    Color, Urine STRAW (*)    Specific Gravity, Urine <1.005 (*)    All other components within normal limits   No results found.   1. Low back pain       MDM  Call your PCP to help you figure out the follow up arrangements since your recent hospital d/c.   rx-hydrocodone, 12.        Worthy Rancher, PA 03/25/12 1902

## 2012-03-25 NOTE — ED Notes (Signed)
Pt presents to er with continued lower back pain, states that she was seen here recently and is not getting any better. Pt states that the pain is increased with movement, pt also admits to pain with urination.

## 2012-03-25 NOTE — ED Notes (Signed)
Back pain, recent adm for uti.  Pt is out of her pain meds.

## 2012-03-25 NOTE — Discharge Instructions (Signed)
Back Pain, Adult Low back pain is very common. About 1 in 5 people have back pain.The cause of low back pain is rarely dangerous. The pain often gets better over time.About half of people with a sudden onset of back pain feel better in just 2 weeks. About 8 in 10 people feel better by 6 weeks.  CAUSES Some common causes of back pain include:  Strain of the muscles or ligaments supporting the spine.   Wear and tear (degeneration) of the spinal discs.   Arthritis.   Direct injury to the back.  DIAGNOSIS Most of the time, the direct cause of low back pain is not known.However, back pain can be treated effectively even when the exact cause of the pain is unknown.Answering your caregiver's questions about your overall health and symptoms is one of the most accurate ways to make sure the cause of your pain is not dangerous. If your caregiver needs more information, he or she may order lab work or imaging tests (X-rays or MRIs).However, even if imaging tests show changes in your back, this usually does not require surgery. HOME CARE INSTRUCTIONS For many people, back pain returns.Since low back pain is rarely dangerous, it is often a condition that people can learn to manageon their own.   Remain active. It is stressful on the back to sit or stand in one place. Do not sit, drive, or stand in one place for more than 30 minutes at a time. Take short walks on level surfaces as soon as pain allows.Try to increase the length of time you walk each day.   Do not stay in bed.Resting more than 1 or 2 days can delay your recovery.   Do not avoid exercise or work.Your body is made to move.It is not dangerous to be active, even though your back may hurt.Your back will likely heal faster if you return to being active before your pain is gone.   Pay attention to your body when you bend and lift. Many people have less discomfortwhen lifting if they bend their knees, keep the load close to their  bodies,and avoid twisting. Often, the most comfortable positions are those that put less stress on your recovering back.   Find a comfortable position to sleep. Use a firm mattress and lie on your side with your knees slightly bent. If you lie on your back, put a pillow under your knees.   Only take over-the-counter or prescription medicines as directed by your caregiver. Over-the-counter medicines to reduce pain and inflammation are often the most helpful.Your caregiver may prescribe muscle relaxant drugs.These medicines help dull your pain so you can more quickly return to your normal activities and healthy exercise.   Put ice on the injured area.   Put ice in a plastic bag.   Place a towel between your skin and the bag.   Leave the ice on for 15 to 20 minutes, 3 to 4 times a day for the first 2 to 3 days. After that, ice and heat may be alternated to reduce pain and spasms.   Ask your caregiver about trying back exercises and gentle massage. This may be of some benefit.   Avoid feeling anxious or stressed.Stress increases muscle tension and can worsen back pain.It is important to recognize when you are anxious or stressed and learn ways to manage it.Exercise is a great option.  SEEK MEDICAL CARE IF:  You have pain that is not relieved with rest or medicine.   You have   pain that does not improve in 1 week.   You have new symptoms.   You are generally not feeling well.  SEEK IMMEDIATE MEDICAL CARE IF:   You have pain that radiates from your back into your legs.   You develop new bowel or bladder control problems.   You have unusual weakness or numbness in your arms or legs.   You develop nausea or vomiting.   You develop abdominal pain.   You feel faint.  Document Released: 11/16/2005 Document Revised: 11/05/2011 Document Reviewed: 04/06/2011 [facility] Patient Information 2012 [place], [place].   You state that you were admitted and treated for a kidney infection.  It  appears after reviewing our record that you were not admitted here.  You should contact the hospital where you were admitted to have them review your discharge instructions with you; including who you are to follow up with, medications , dosages etc.  Call you primary care MD for any further medication needs.  He may also be able to help you sort  Out any questions you might have in this regard.  We see no signs of a urinary tract infection presently.

## 2012-03-25 NOTE — ED Provider Notes (Signed)
Medical screening examination/treatment/procedure(s) were performed by non-physician practitioner and as supervising physician I was immediately available for consultation/collaboration. Devoria Albe, MD, Armando Gang   Ward Givens, MD 03/25/12 [id number]

## 2012-04-17 ENCOUNTER — Encounter (HOSPITAL_COMMUNITY): Payer: Self-pay

## 2012-04-17 ENCOUNTER — Emergency Department (HOSPITAL_COMMUNITY)
Admission: EM | Admit: 2012-04-17 | Discharge: 2012-04-17 | Disposition: A | Discharge status: Home / Self Care | Payer: Medicare Other | Attending: Emergency Medicine | Admitting: Emergency Medicine

## 2012-04-17 ENCOUNTER — Emergency Department (HOSPITAL_COMMUNITY): Payer: Medicare Other

## 2012-04-17 DIAGNOSIS — F411 Generalized anxiety disorder: Secondary | ICD-10-CM | POA: Insufficient documentation

## 2012-04-17 DIAGNOSIS — Z79899 Other long term (current) drug therapy: Secondary | ICD-10-CM | POA: Insufficient documentation

## 2012-04-17 DIAGNOSIS — R1033 Periumbilical pain: Secondary | ICD-10-CM | POA: Insufficient documentation

## 2012-04-17 DIAGNOSIS — F319 Bipolar disorder, unspecified: Secondary | ICD-10-CM | POA: Insufficient documentation

## 2012-04-17 DIAGNOSIS — E7800 Pure hypercholesterolemia, unspecified: Secondary | ICD-10-CM | POA: Insufficient documentation

## 2012-04-17 DIAGNOSIS — Z8711 Personal history of peptic ulcer disease: Secondary | ICD-10-CM | POA: Insufficient documentation

## 2012-04-17 DIAGNOSIS — R112 Nausea with vomiting, unspecified: Secondary | ICD-10-CM | POA: Insufficient documentation

## 2012-04-17 DIAGNOSIS — I10 Essential (primary) hypertension: Secondary | ICD-10-CM | POA: Insufficient documentation

## 2012-04-17 DIAGNOSIS — K5090 Crohn's disease, unspecified, without complications: Secondary | ICD-10-CM | POA: Insufficient documentation

## 2012-04-17 DIAGNOSIS — K219 Gastro-esophageal reflux disease without esophagitis: Secondary | ICD-10-CM | POA: Insufficient documentation

## 2012-04-17 LAB — DIFFERENTIAL
Basophils Absolute: 0 10*3/uL (ref 0.0–0.1)
Eosinophils Relative: 2 % (ref 0–5)
Lymphocytes Relative: 31 % (ref 12–46)
Lymphs Abs: 2.5 10*3/uL (ref 0.7–4.0)
Monocytes Absolute: 0.5 10*3/uL (ref 0.1–1.0)
Monocytes Relative: 7 % (ref 3–12)
Neutro Abs: 4.8 10*3/uL (ref 1.7–7.7)

## 2012-04-17 LAB — CBC
HCT: 33 % — ABNORMAL LOW (ref 36.0–46.0)
Hemoglobin: 11 g/dL — ABNORMAL LOW (ref 12.0–15.0)
MCV: 79.3 fL (ref 78.0–100.0)
RBC: 4.16 MIL/uL (ref 3.87–5.11)
RDW: 14.8 % (ref 11.5–15.5)
WBC: 8 10*3/uL (ref 4.0–10.5)

## 2012-04-17 LAB — COMPREHENSIVE METABOLIC PANEL
AST: 16 U/L (ref 0–37)
Albumin: 3.5 g/dL (ref 3.5–5.2)
BUN: 11 mg/dL (ref 6–23)
Calcium: 9.3 mg/dL (ref 8.4–10.5)
Chloride: 97 mEq/L (ref 96–112)
Creatinine, Ser: 0.69 mg/dL (ref 0.50–1.10)
Total Bilirubin: 0.2 mg/dL — ABNORMAL LOW (ref 0.3–1.2)
Total Protein: 6.8 g/dL (ref 6.0–8.3)

## 2012-04-17 LAB — URINALYSIS, ROUTINE W REFLEX MICROSCOPIC
Bilirubin Urine: NEGATIVE
Ketones, ur: NEGATIVE mg/dL
Nitrite: NEGATIVE
Urobilinogen, UA: 0.2 mg/dL (ref 0.0–1.0)
pH: 7.5 (ref 5.0–8.0)

## 2012-04-17 LAB — URINE MICROSCOPIC-ADD ON

## 2012-04-17 LAB — LIPASE, BLOOD: Lipase: 26 U/L (ref 11–59)

## 2012-04-17 MED ORDER — METHYLPREDNISOLONE SODIUM SUCC 125 MG IJ SOLR
125.0000 mg | Freq: Once | INTRAMUSCULAR | Status: AC
  Administered 2012-04-17: 125 mg via INTRAVENOUS
  Filled 2012-04-17: qty 2

## 2012-04-17 MED ORDER — HYDROMORPHONE HCL PF 1 MG/ML IJ SOLN
1.0000 mg | Freq: Once | INTRAMUSCULAR | Status: AC
  Administered 2012-04-17: 1 mg via INTRAVENOUS
  Filled 2012-04-17: qty 1

## 2012-04-17 MED ORDER — OXYCODONE-ACETAMINOPHEN 5-325 MG PO TABS: 1.0000 | ORAL_TABLET | ORAL | Status: AC | PRN

## 2012-04-17 MED ORDER — PROMETHAZINE HCL 25 MG/ML IJ SOLN
25.0000 mg | Freq: Once | INTRAMUSCULAR | Status: AC
  Administered 2012-04-17: 25 mg via INTRAMUSCULAR
  Filled 2012-04-17: qty 1

## 2012-04-17 MED ORDER — PROMETHAZINE HCL 25 MG PO TABS: 25.0000 mg | ORAL_TABLET | Freq: Four times a day (QID) | ORAL | Status: AC | PRN

## 2012-04-17 MED ORDER — PREDNISONE 20 MG PO TABS: 60.0000 mg | ORAL_TABLET | Freq: Every day | ORAL | Status: DC

## 2012-04-17 NOTE — Discharge Instructions (Signed)
You need to make arrangements to followup with a gastroenterologist. Her pain is most likely related to Crohn's disease. Her being placed on a short course of prednisone which will probably help. However, you may need to be on a lower dose that tapers down over a period of weeks. Please call the gastroenterologist you referred today for followup appointment this week.  Abdominal Pain Abdominal pain can be caused by many things. Your caregiver decides the seriousness of your pain by an examination and possibly blood tests and X-rays. Many cases can be observed and treated at home. Most abdominal pain is not caused by a disease and will probably improve without treatment. However, in many cases, more time must pass before a clear cause of the pain can be found. Before that point, it may not be known if you need more testing, or if hospitalization or surgery is needed. HOME CARE INSTRUCTIONS   Do not take laxatives unless directed by your caregiver.   Take pain medicine only as directed by your caregiver.   Only take over-the-counter or prescription medicines for pain, discomfort, or fever as directed by your caregiver.   Try a clear liquid diet (broth, tea, or water) for as long as directed by your caregiver. Slowly move to a bland diet as tolerated.  SEEK IMMEDIATE MEDICAL CARE IF:   The pain does not go away.   You have a fever.   You keep throwing up (vomiting).   The pain is felt only in portions of the abdomen. Pain in the right side could possibly be appendicitis. In an adult, pain in the left lower portion of the abdomen could be colitis or diverticulitis.   You pass bloody or black tarry stools.  MAKE SURE YOU:   Understand these instructions.   Will watch your condition.   Will get help right away if you are not doing well or get worse.  Document Released: 08/26/2005 Document Revised: 11/05/2011 Document Reviewed: 07/04/2008 [facility] Patient Information 2012 [place],  [place].  Crohn's Disease Crohn's disease is a long-term (chronic) soreness and redness (inflammation) of the intestines (bowel). It can affect any portion of the digestive tract, from the mouth to the anus. It can also cause problems outside the digestive tract. Crohn's disease is closely related to a disease called ulcerative colitis (together, these two diseases are called inflammatory bowel disease).  CAUSES  The cause of Crohn's disease is not known. One Nelva Bush is that, in an easily affected (susceptible) person, the immune system is triggered to attack the body's own digestive tissue. Crohn's disease runs in families. It seems to be more common in certain geographic areas and amongst certain races. There are no clear-cut dietary causes.  SYMPTOMS  Crohn's disease can cause many different symptoms since it can affect many different parts of the body. Symptoms include:  Fatigue.   Weight loss.   Chronic diarrhea, sometime bloody.   Abdominal pain and cramps.   Fever.   Ulcers or canker sores in the mouth or rectum.   Anemia (low red blood cells).   Arthritis, skin problems, and eye problems may occur.  Complications of Crohn's disease can include:  Series of holes (perforation) of the bowel.   Portions of the intestines sticking to each other (adhesions).   Obstruction of the bowel.   Fistula formation, typically in the rectal area but also in other areas. A fistula is an opening between the bowels and the outside, or between the bowels and another organ.  A painful crack in the mucous membrane of the anus (rectal fissure).  DIAGNOSIS  Your caregiver may suspect Crohn's disease based on your symptoms and an exam. Blood tests may confirm that there is a problem. You may be asked to submit a stool specimen for examination. X-rays and CT scans may be necessary. Ultimately, the diagnosis is usually made after a procedure that uses a flexible tube that is inserted via your mouth or  your anus. This is done under sedation and is called either an upper endoscopy or colonoscopy. With these tests, the specialist can take tiny tissue samples and remove them from the inside of the bowel (biopsy). Examination of this biopsy tissue under a microscope can reveal Crohn's disease as the cause of your symptoms. Due to the many different forms that Crohn's disease can take, symptoms may be present for several years before a diagnosis is made. HOME CARE INSTRUCTIONS   There is no cure for Crohn's disease. The best treatment is frequent checkups with your caregiver.   Symptoms such as diarrhea can be controlled with medications. Avoid foods that have a laxative effect such as fresh fruit, vegetables and dairy products. During flare ups, you can rest your bowel by refraining from solid foods. Drink clear liquids frequently during the day (electrolyte or re-hydrating fluids are best. Your caregiver can help you with suggestions). Drink often to prevent loss of body fluids (dehydration). When diarrhea has cleared, eat small meals and more frequently. Avoid food additives and stimulants such as caffeine (coffee, tea, or chocolate). Enzyme supplements may help if you develop intolerance to a sugar in dairy products (lactose). Ask your caregiver or dietitian about specific dietary instructions.   Try to maintain a positive attitude. Learn relaxation techniques such as self hypnosis, mental imaging, and muscle relaxation.   If possible, avoid stresses which can aggravate your condition.   Exercise regularly.   Follow your diet.   Always get plenty of rest.  SEEK MEDICAL CARE IF:   Your symptoms fail to improve after a week or two of new treatment.   You experience continued weight loss.   You have ongoing crampy digestion or loose bowels.   You develop a new skin rash, skin sores, or eye problems.  SEEK IMMEDIATE MEDICAL CARE IF:   You have worsening of your symptoms or develop new  symptoms.   You have a fever.   You develop bloody diarrhea.   You develop severe abdominal pain.  MAKE SURE YOU:   Understand these instructions.   Will watch your condition.   Will get help right away if you are not doing well or get worse.  Document Released: 08/26/2005 Document Revised: 11/05/2011 Document Reviewed: 07/25/2007 [facility] Patient Information 2012 [place], [place].  Promethazine tablets What is this medicine? PROMETHAZINE (proe METH a zeen) is an antihistamine. It is used to treat allergic reactions and to treat or prevent nausea and vomiting from illness or motion sickness. It is also used to make you sleep before surgery, and to help treat pain or nausea after surgery. This medicine may be used for other purposes; ask your health care provider or pharmacist if you have questions. What should I tell my health care provider before I take this medicine? They need to know if you have any of these conditions: -glaucoma -high blood pressure or heart disease -kidney disease -liver disease -lung or breathing disease, like asthma -prostate trouble -pain or difficulty passing urine -seizures -an unusual or allergic reaction to promethazine  or phenothiazines, other medicines, foods, dyes, or preservatives -pregnant or trying to get pregnant -breast-feeding How should I use this medicine? Take this medicine by mouth with a glass of water. Follow the directions on the prescription label. Take your doses at regular intervals. Do not take your medicine more often than directed. Talk to your pediatrician regarding the use of this medicine in children. Special care may be needed. This medicine should not be given to infants and children younger than 56 years old. Overdosage: If you think you have taken too much of this medicine contact a poison control center or emergency room at once. NOTE: This medicine is only for you. Do not share this medicine with others. What if I miss a  dose? If you miss a dose, take it as soon as you can. If it is almost time for your next dose, take only that dose. Do not take double or extra doses. What may interact with this medicine? Do not take this medicine with any of the following medications: -medicines called MAO Inhibitors like Nardil, Parnate, Marplan, Eldepryl -other phenothiazines like trimethobenzamide This medicine may also interact with the following medications: -barbiturates like phenobarbital -bromocriptine -certain antidepressants -certain antihistamines used in allergy or cold medicines -epinephrine -levodopa -medicines for sleep -medicines for mental problems and psychotic disturbances -medicines for movement abnormalities as in Parkinson's disease, or for gastrointestinal problems -muscle relaxants -prescription pain medicines This list may not describe all possible interactions. Give your health care provider a list of all the medicines, herbs, non-prescription drugs, or dietary supplements you use. Also tell them if you smoke, drink alcohol, or use illegal drugs. Some items may interact with your medicine. What should I watch for while using this medicine? Tell your doctor or health care professional if your symptoms do not start to get better in 1 to 2 days. You may get drowsy or dizzy. Do not drive, use machinery, or do anything that needs mental alertness until you know how this medicine affects you. To reduce the risk of dizzy or fainting spells, do not stand or sit up quickly, especially if you are an older patient. Alcohol may increase dizziness and drowsiness. Avoid alcoholic drinks. Your mouth may get dry. Chewing sugarless gum or sucking hard candy, and drinking plenty of water may help. Contact your doctor if the problem does not go away or is severe. This medicine may cause dry eyes and blurred vision. If you wear contact lenses you may feel some discomfort. Lubricating drops may help. See your eye doctor  if the problem does not go away or is severe. This medicine can make you more sensitive to the sun. Keep out of the sun. If you cannot avoid being in the sun, wear protective clothing and use sunscreen. Do not use sun lamps or tanning beds/booths. If you are diabetic, check your blood-sugar levels regularly. What side effects may I notice from receiving this medicine? Side effects that you should report to your doctor or health care professional as soon as possible: -blurred vision -irregular heartbeat, palpitations or chest pain -muscle or facial twitches -pain or difficulty passing urine -seizures -skin rash -slowed or shallow breathing -unusual bleeding or bruising -yellowing of the eyes or skin Side effects that usually do not require medical attention (report to your doctor or health care professional if they continue or are bothersome): -headache -nightmares, agitation, nervousness, excitability, not able to sleep (these are more likely in children) -stuffy nose This list may not describe all possible  side effects. Call your doctor for medical advice about side effects. You may report side effects to FDA at 1-800-FDA-1088. Where should I keep my medicine? Keep out of the reach of children. Store at room temperature, between 20 and 25 degrees C (68 and 77 degrees F). Protect from light. Throw away any unused medicine after the expiration date. NOTE: This sheet is a summary. It may not cover all possible information. If you have questions about this medicine, talk to your doctor, pharmacist, or health care provider.  2012, [employer]. (06/14/2008 12:05:26 PM)  Acetaminophen; Oxycodone tablets What is this medicine? ACETAMINOPHEN; OXYCODONE (a set a MEE noe fen; ox i KOE done) is a pain reliever. It is used to treat mild to moderate pain. This medicine may be used for other purposes; ask your health care provider or pharmacist if you have questions. What should I tell my  health care provider before I take this medicine? They need to know if you have any of these conditions: -brain tumor -Crohn's disease, inflammatory bowel disease, or ulcerative colitis -drink more than 3 alcohol containing drinks per day -drug abuse or addiction -head injury -heart or circulation problems -kidney disease or problems going to the bathroom -liver disease -lung disease, asthma, or breathing problems -an unusual or allergic reaction to acetaminophen, oxycodone, other opioid analgesics, other medicines, foods, dyes, or preservatives -pregnant or trying to get pregnant -breast-feeding How should I use this medicine? Take this medicine by mouth with a full glass of water. Follow the directions on the prescription label. Take your medicine at regular intervals. Do not take your medicine more often than directed. Talk to your pediatrician regarding the use of this medicine in children. Special care may be needed. Patients over 71 years old may have a stronger reaction and need a smaller dose. Overdosage: If you think you have taken too much of this medicine contact a poison control center or emergency room at once. NOTE: This medicine is only for you. Do not share this medicine with others. What if I miss a dose? If you miss a dose, take it as soon as you can. If it is almost time for your next dose, take only that dose. Do not take double or extra doses. What may interact with this medicine? -alcohol or medicines that contain alcohol -antihistamines -barbiturates like amobarbital, butalbital, butabarbital, methohexital, pentobarbital, phenobarbital, thiopental, and secobarbital -benztropine -drugs for bladder problems like solifenacin, trospium, oxybutynin, tolterodine, hyoscyamine, and methscopolamine -drugs for breathing problems like ipratropium and tiotropium -drugs for certain stomach or intestine problems like propantheline, homatropine methylbromide, glycopyrrolate,  atropine, belladonna, and dicyclomine -general anesthetics like etomidate, ketamine, nitrous oxide, propofol, desflurane, enflurane, halothane, isoflurane, and sevoflurane -medicines for depression, anxiety, or psychotic disturbances -medicines for pain like codeine, morphine, pentazocine, buprenorphine, butorphanol, nalbuphine, tramadol, and propoxyphene -medicines for sleep -muscle relaxants -naltrexone -phenothiazines like perphenazine, thioridazine, chlorpromazine, mesoridazine, fluphenazine, prochlorperazine, promazine, and trifluoperazine -scopolamine -trihexyphenidyl This list may not describe all possible interactions. Give your health care provider a list of all the medicines, herbs, non-prescription drugs, or dietary supplements you use. Also tell them if you smoke, drink alcohol, or use illegal drugs. Some items may interact with your medicine. What should I watch for while using this medicine? Tell your doctor or health care professional if your pain does not go away, if it gets worse, or if you have new or a different type of pain. You may develop tolerance to the medicine. Tolerance means that you will need a  higher dose of the medication for pain relief. Tolerance is normal and is expected if you take this medicine for a long time. Do not suddenly stop taking your medicine because you may develop a severe reaction. Your body becomes used to the medicine. This does NOT mean you are addicted. Addiction is a behavior related to getting and using a drug for a nonmedical reason. If you have pain, you have a medical reason to take pain medicine. Your doctor will tell you how much medicine to take. If your doctor wants you to stop the medicine, the dose will be slowly lowered over time to avoid any side effects. You may get drowsy or dizzy. Do not drive, use machinery, or do anything that needs mental alertness until you know how this medicine affects you. Do not stand or sit up quickly,  especially if you are an older patient. This reduces the risk of dizzy or fainting spells. Alcohol may interfere with the effect of this medicine. Avoid alcoholic drinks. The medicine will cause constipation. Try to have a bowel movement at least every 2 to 3 days. If you do not have a bowel movement for 3 days, call your doctor or health care professional. Do not take Tylenol (acetaminophen) or medicines that have acetaminophen with this medicine. Too much acetaminophen can be very dangerous. Many nonprescription medicines contain acetaminophen. Always read the labels carefully to avoid taking more acetaminophen. What side effects may I notice from receiving this medicine? Side effects that you should report to your doctor or health care professional as soon as possible: -allergic reactions like skin rash, itching or hives, swelling of the face, lips, or tongue -breathing difficulties, wheezing -confusion -light headedness or fainting spells -severe stomach pain -yellowing of the skin or the whites of the eyes Side effects that usually do not require medical attention (report to your doctor or health care professional if they continue or are bothersome): -dizziness -drowsiness -nausea -vomiting This list may not describe all possible side effects. Call your doctor for medical advice about side effects. You may report side effects to FDA at 1-800-FDA-1088. Where should I keep my medicine? Keep out of the reach of children. This medicine can be abused. Keep your medicine in a safe place to protect it from theft. Do not share this medicine with anyone. Selling or giving away this medicine is dangerous and against the law. Store at room temperature between 20 and 25 degrees C (68 and 77 degrees F). Keep container tightly closed. Protect from light. Flush any unused medicines down the toilet. Do not use the medicine after the expiration date. NOTE: This sheet is a summary. It may not cover all  possible information. If you have questions about this medicine, talk to your doctor, pharmacist, or health care provider.  2012, [employer] Standard. (10/15/2008 10:01:21 AM)  Prednisone tablets What is this medicine? PREDNISONE (PRED ni sone) is a corticosteroid. It is commonly used to treat inflammation of the skin, joints, lungs, and other organs. Common conditions treated include asthma, allergies, and arthritis. It is also used for other conditions, such as blood disorders and diseases of the adrenal glands. This medicine may be used for other purposes; ask your health care provider or pharmacist if you have questions. What should I tell my health care provider before I take this medicine? They need to know if you have any of these conditions: -Cushing's syndrome -diabetes -glaucoma -heart disease -high blood pressure -infection (especially a virus infection such as chickenpox,  cold sores, or herpes) -kidney disease -liver disease -mental illness -myasthenia gravis -osteoporosis -seizures -stomach or intestine problems -thyroid disease -an unusual or allergic reaction to lactose, prednisone, other medicines, foods, dyes, or preservatives -pregnant or trying to get pregnant -breast-feeding How should I use this medicine? Take this medicine by mouth with a glass of water. Follow the directions on the prescription label. Take this medicine with food. If you are taking this medicine once a day, take it in the morning. Do not take more medicine than you are told to take. Do not suddenly stop taking your medicine because you may develop a severe reaction. Your doctor will tell you how much medicine to take. If your doctor wants you to stop the medicine, the dose may be slowly lowered over time to avoid any side effects. Talk to your pediatrician regarding the use of this medicine in children. Special care may be needed. Overdosage: If you think you have taken too much of this  medicine contact a poison control center or emergency room at once. NOTE: This medicine is only for you. Do not share this medicine with others. What if I miss a dose? If you miss a dose, take it as soon as you can. If it is almost time for your next dose, talk to your doctor or health care professional. You may need to miss a dose or take an extra dose. Do not take double or extra doses without advice. What may interact with this medicine? Do not take this medicine with any of the following medications: -metyrapone -mifepristone This medicine may also interact with the following medications: -aminoglutethimide -amphotericin B -aspirin and aspirin-like medicines -barbiturates -certain medicines for diabetes, like glipizide or glyburide -cholestyramine -cholinesterase inhibitors -cyclosporine -digoxin -diuretics -ephedrine -female hormones, like estrogens and birth control pills -isoniazid -ketoconazole -NSAIDS, medicines for pain and inflammation, like ibuprofen or naproxen -phenytoin -rifampin -toxoids -vaccines -warfarin This list may not describe all possible interactions. Give your health care provider a list of all the medicines, herbs, non-prescription drugs, or dietary supplements you use. Also tell them if you smoke, drink alcohol, or use illegal drugs. Some items may interact with your medicine. What should I watch for while using this medicine? Visit your doctor or health care professional for regular checks on your progress. If you are taking this medicine over a prolonged period, carry an identification card with your name and address, the type and dose of your medicine, and your doctor's name and address. This medicine may increase your risk of getting an infection. Tell your doctor or health care professional if you are around anyone with measles or chickenpox, or if you develop sores or blisters that do not heal properly. If you are going to have surgery, tell your  doctor or health care professional that you have taken this medicine within the last twelve months. Ask your doctor or health care professional about your diet. You may need to lower the amount of salt you eat. This medicine may affect blood sugar levels. If you have diabetes, check with your doctor or health care professional before you change your diet or the dose of your diabetic medicine. What side effects may I notice from receiving this medicine? Side effects that you should report to your doctor or health care professional as soon as possible: -allergic reactions like skin rash, itching or hives, swelling of the face, lips, or tongue -changes in emotions or moods -changes in vision -depressed mood -eye pain -fever or chills, cough,  sore throat, pain or difficulty passing urine -increased thirst -swelling of ankles, feet Side effects that usually do not require medical attention (report to your doctor or health care professional if they continue or are bothersome): -confusion, excitement, restlessness -headache -nausea, vomiting -skin problems, acne, thin and shiny skin -trouble sleeping -weight gain This list may not describe all possible side effects. Call your doctor for medical advice about side effects. You may report side effects to FDA at 1-800-FDA-1088. Where should I keep my medicine? Keep out of the reach of children. Store at room temperature between 15 and 30 degrees C (59 and 86 degrees F). Protect from light. Keep container tightly closed. Throw away any unused medicine after the expiration date. NOTE: This sheet is a summary. It may not cover all possible information. If you have questions about this medicine, talk to your doctor, pharmacist, or health care provider.  2012, [employer]. (07/02/2011 10:57:14 AM)

## 2012-04-17 NOTE — ED Notes (Signed)
Pt c/o generalized abd pain with nausea x 3 days.  LBM was yesterday and was normal.

## 2012-04-17 NOTE — ED Provider Notes (Signed)
History  This chart was scribed for Dione Booze, MD by Stevphen Meuse. This patient was seen in room APA11/APA11 and the patient's care was started at 7:40AM. CSN: [id number]  Arrival date & time 04/17/12  0725   First MD Initiated Contact with Patient 04/17/12 0732      Chief Complaint  Patient presents with  . Abdominal Pain    (Consider location/radiation/quality/duration/timing/severity/associated sxs/prior treatment) Patient is a 51 y.o. female presenting with abdominal pain. The history is provided by the patient.  Abdominal Pain The primary symptoms of the illness include abdominal pain and vomiting. The primary symptoms of the illness do not include fever or diarrhea.  Additional symptoms associated with the illness include chills. Symptoms associated with the illness do not include constipation.   [name] is a 51 y.o. female who presents to the Emergency Department complaining of 3 days of gradual onset, gradually worsening non radiating severe periumbilical abdominal pain. Pt describes the pain as sharp and achy and rates the pain as a 8 out to 10. She reports that her last bowel movement was yesterday and was normal. Pt reports trying Mylanta with no relief. Pt reports vomiting with no relief. She denies any modifying factors. Pt also states that she also has had back pain and has taken hydrocodone with no relief. Pt denies fever, constipation, diarrhea but reports vomiting and chills as associated symptoms. Pt has h/o HTN, acid reflux, chronic pain, crohn's disease and stomach ulcer. Pt denies a h/o smoking and alcohol use.   Past Medical History  Diagnosis Date  . Hypertension   . Bipolar 1 disorder   . Depression   . Seizures   . Hypercholesteremia   . Acid reflux   . Chronic pain   . Stomach ulcer   . Headache   . Anxiety   . Crohn's disease     Past Surgical History  Procedure Date  . Cholecystectomy   . Tonsillectomy   . Stomach surgery     Family  History  Problem Relation Age of Onset  . Hypertension Father   . Diabetes type II Brother   . Hypertension Brother   . Diabetes type II Mother     History  Substance Use Topics  . Smoking status: Never Smoker   . Smokeless tobacco: Not on file  . Alcohol Use: No    OB History    Grav Para Term Preterm Abortions TAB SAB Ect Mult Living                  Review of Systems  Constitutional: Positive for chills. Negative for fever.  Gastrointestinal: Positive for vomiting and abdominal pain. Negative for diarrhea and constipation.  All other systems reviewed and are negative.    Allergies  Compazine; Aspirin; Cephalexin; Erythromycin; Ketorolac tromethamine; Metoclopramide hcl; Ondansetron hcl; Penicillins; Sumatriptan; and Tetracycline  Home Medications   Current Outpatient Rx  Name Route Sig Dispense Refill  . ACETAMINOPHEN 500 MG PO TABS Oral Take 500 mg by mouth every 6 (six) hours as needed. pain    . ALPRAZOLAM 1 MG PO TABS Oral Take 1 mg by mouth 4 (four) times daily.     . ARIPIPRAZOLE 10 MG PO TABS Oral Take 10 mg by mouth at bedtime.     Marland Kitchen CLONIDINE HCL 0.1 MG PO TABS Oral Take 1 tablet (0.1 mg total) by mouth 2 (two) times daily. 1 tablet 0    Hold for 1 week then resume  . DULOXETINE  HCL 60 MG PO CPEP Oral Take 60 mg by mouth daily.     Marland Kitchen LABETALOL HCL 200 MG PO TABS Oral Take 1 tablet (200 mg total) by mouth 2 (two) times daily. 1 tablet 0    Hold for 1 week then resume  . LEVETIRACETAM 500 MG PO TABS Oral Take 500-1,000 mg by mouth 2 (two) times daily. Take 1 by mouth in the morning and take 2 at bedtime    . LUBIPROSTONE 8 MCG PO CAPS Oral Take 8 mcg by mouth 2 (two) times daily with a meal.     . OMEPRAZOLE 40 MG PO CPDR Oral Take 40 mg by mouth daily.     Marland Kitchen PROMETHAZINE HCL 25 MG PO TABS Oral Take 1 tablet (25 mg total) by mouth every 6 (six) hours as needed for nausea. 15 tablet 0  . SIMVASTATIN 20 MG PO TABS Oral Take 20 mg by mouth at bedtime.     Marland Kitchen  ZOLPIDEM TARTRATE 10 MG PO TABS Oral Take 10 mg by mouth at bedtime as needed. For sleep       Triage Vitals: Ht 5\' 4"  (1.626 m)  Wt 198 lb (89.812 kg)  BMI 33.99 kg/m2  Physical Exam  Nursing note and vitals reviewed. Constitutional: She is oriented to person, place, and time. She appears well-developed and well-nourished.  HENT:  Head: Normocephalic and atraumatic.  Eyes: Conjunctivae and EOM are normal.  Neck: Normal range of motion. Neck supple.  Cardiovascular: Normal rate and regular rhythm.   Pulmonary/Chest: Effort normal and breath sounds normal.  Abdominal: There is tenderness (moderate tenderness diffusely, worse tenderness in the midline inferior to the umbilicus). There is no rebound and no guarding.       Pts bowel sounds are decreased   Musculoskeletal: Normal range of motion.  Neurological: She is alert and oriented to person, place, and time.  Skin: Skin is warm and dry.  Psychiatric: She has a normal mood and affect. Her behavior is normal.    ED Course  Procedures (including critical care time) DIAGNOSTIC STUDIES: Oxygen Saturation is 97% on room air, normal by my interpretation.    COORDINATION OF CARE: 7:44AM discussed ordering blood work to check for abnormalities with pt and pt agreed    Results for orders placed during the hospital encounter of 04/17/12  URINALYSIS, ROUTINE W REFLEX MICROSCOPIC      Component Value Range   Color, Urine YELLOW  YELLOW    APPearance CLEAR  CLEAR    Specific Gravity, Urine 1.015  1.005 - 1.030    pH 7.5  5.0 - 8.0    Glucose, UA NEGATIVE  NEGATIVE (mg/dL)   Hgb urine dipstick NEGATIVE  NEGATIVE    Bilirubin Urine NEGATIVE  NEGATIVE    Ketones, ur NEGATIVE  NEGATIVE (mg/dL)   Protein, ur NEGATIVE  NEGATIVE (mg/dL)   Urobilinogen, UA 0.2  0.0 - 1.0 (mg/dL)   Nitrite NEGATIVE  NEGATIVE    Leukocytes, UA TRACE (*) NEGATIVE   COMPREHENSIVE METABOLIC PANEL      Component Value Range   Sodium 134 (*) 135 - 145 (mEq/L)     Potassium 3.9  3.5 - 5.1 (mEq/L)   Chloride 97  96 - 112 (mEq/L)   CO2 24  19 - 32 (mEq/L)   Glucose, Bld 112 (*) 70 - 99 (mg/dL)   BUN 11  6 - 23 (mg/dL)   Creatinine, Ser 0.45  0.50 - 1.10 (mg/dL)   Calcium 9.3  8.4 - 10.5 (mg/dL)   Total Protein 6.8  6.0 - 8.3 (g/dL)   Albumin 3.5  3.5 - 5.2 (g/dL)   AST 16  0 - 37 (U/L)   ALT 10  0 - 35 (U/L)   Alkaline Phosphatase 93  39 - 117 (U/L)   Total Bilirubin 0.2 (*) 0.3 - 1.2 (mg/dL)   GFR calc non Af Amer >90  >90 (mL/min)   GFR calc Af Amer >90  >90 (mL/min)  LIPASE, BLOOD      Component Value Range   Lipase 26  11 - 59 (U/L)  CBC      Component Value Range   WBC 8.0  4.0 - 10.5 (K/uL)   RBC 4.16  3.87 - 5.11 (MIL/uL)   Hemoglobin 11.0 (*) 12.0 - 15.0 (g/dL)   HCT 16.1 (*) 09.6 - 46.0 (%)   MCV 79.3  78.0 - 100.0 (fL)   MCH 26.4  26.0 - 34.0 (pg)   MCHC 33.3  30.0 - 36.0 (g/dL)   RDW 04.5  40.9 - 81.1 (%)   Platelets 277  150 - 400 (K/uL)  DIFFERENTIAL      Component Value Range   Neutrophils Relative 60  43 - 77 (%)   Neutro Abs 4.8  1.7 - 7.7 (K/uL)   Lymphocytes Relative 31  12 - 46 (%)   Lymphs Abs 2.5  0.7 - 4.0 (K/uL)   Monocytes Relative 7  3 - 12 (%)   Monocytes Absolute 0.5  0.1 - 1.0 (K/uL)   Eosinophils Relative 2  0 - 5 (%)   Eosinophils Absolute 0.2  0.0 - 0.7 (K/uL)   Basophils Relative 1  0 - 1 (%)   Basophils Absolute 0.0  0.0 - 0.1 (K/uL)  URINE MICROSCOPIC-ADD ON      Component Value Range   Squamous Epithelial / LPF FEW (*) RARE    WBC, UA 0-2  <3 (WBC/hpf)   Bacteria, UA RARE  RARE    Dg Abd Acute W/chest  04/17/2012  *RADIOLOGY REPORT*  Clinical Data: Upper abdominal pain and nausea.  History of Crohn disease.  Status post partial gastrectomy.  ACUTE ABDOMEN SERIES (ABDOMEN 2 VIEW & CHEST 1 VIEW)  Comparison: Chest x-ray 01/21/2012.  CT of abdomen and pelvis 03/01/2012.  Findings: Lung volumes are normal.  No consolidative airspace disease.  No pleural effusions.  No pneumothorax.  No pulmonary  nodule or mass noted.  Pulmonary vasculature and the cardiomediastinal silhouette are within normal limits.  No pneumoperitoneum.  Supine and upright views of the abdomen so demonstrate gas and stool scattered throughout the colon extending to the level of the distal rectum.  There are a few borderline dilated gas-filled loops of small bowel noted in the central abdomen.  No other definite pathologic distension of bowel.  Numerous surgical clips are noted throughout the upper and central abdomen, with a few surgical clips in the anatomic pelvis on the right.  IMPRESSION: 1.  Nonspecific, nonobstructive bowel gas pattern. 2.  No pneumoperitoneum. 3.  No radiographic evidence of acute cardiopulmonary disease. 4.  Extensive postoperative changes in the abdomen and pelvis, as above.  Original Report Authenticated By: Florencia Reasons, M.D.       1. Abdominal pain       MDM  Abdominal pain which is likely a flareup of Crohn's disease. Her workup has been reviewed and she has had several CT scans of her abdomen. In an attempt to limit her radiation exposure,  plain x-rays be obtained to rule out obstruction and laboratory workup initiated.  Laboratory workup and x-rays are unremarkable. She got good pain relief from IV hydromorphone. She will need to followup with a gastroenterologist. She'll be placed on prednisone for the next 5 days with initial doses of nitroglycerin given in the emergency department. She is also given prescription for Phenergan tablets and Percocet for pain and is to return if symptoms worsen.  I personally performed the services described in this documentation, which was scribed in my presence. The recorded information has been reviewed and considered.          Dione Booze, MD 04/17/12 [id number]

## 2012-05-13 ENCOUNTER — Emergency Department (HOSPITAL_COMMUNITY)
Admission: EM | Admit: 2012-05-13 | Discharge: 2012-05-14 | Disposition: A | Discharge status: Home / Self Care | Payer: Medicare Other | Attending: Emergency Medicine | Admitting: Emergency Medicine

## 2012-05-13 ENCOUNTER — Encounter (HOSPITAL_COMMUNITY): Payer: Self-pay | Admitting: *Deleted

## 2012-05-13 DIAGNOSIS — K5090 Crohn's disease, unspecified, without complications: Secondary | ICD-10-CM | POA: Insufficient documentation

## 2012-05-13 DIAGNOSIS — G8929 Other chronic pain: Secondary | ICD-10-CM | POA: Insufficient documentation

## 2012-05-13 DIAGNOSIS — G40909 Epilepsy, unspecified, not intractable, without status epilepticus: Secondary | ICD-10-CM | POA: Insufficient documentation

## 2012-05-13 DIAGNOSIS — F411 Generalized anxiety disorder: Secondary | ICD-10-CM | POA: Insufficient documentation

## 2012-05-13 DIAGNOSIS — K219 Gastro-esophageal reflux disease without esophagitis: Secondary | ICD-10-CM | POA: Insufficient documentation

## 2012-05-13 DIAGNOSIS — Z880 Allergy status to penicillin: Secondary | ICD-10-CM | POA: Insufficient documentation

## 2012-05-13 DIAGNOSIS — E7800 Pure hypercholesterolemia, unspecified: Secondary | ICD-10-CM | POA: Insufficient documentation

## 2012-05-13 DIAGNOSIS — F319 Bipolar disorder, unspecified: Secondary | ICD-10-CM | POA: Insufficient documentation

## 2012-05-13 DIAGNOSIS — R112 Nausea with vomiting, unspecified: Secondary | ICD-10-CM | POA: Insufficient documentation

## 2012-05-13 DIAGNOSIS — R509 Fever, unspecified: Secondary | ICD-10-CM | POA: Insufficient documentation

## 2012-05-13 DIAGNOSIS — R109 Unspecified abdominal pain: Secondary | ICD-10-CM

## 2012-05-13 DIAGNOSIS — I10 Essential (primary) hypertension: Secondary | ICD-10-CM | POA: Insufficient documentation

## 2012-05-13 LAB — URINALYSIS, ROUTINE W REFLEX MICROSCOPIC
Glucose, UA: NEGATIVE mg/dL
Leukocytes, UA: NEGATIVE
Nitrite: NEGATIVE
Specific Gravity, Urine: 1.02 (ref 1.005–1.030)
pH: 6 (ref 5.0–8.0)

## 2012-05-13 LAB — BASIC METABOLIC PANEL
BUN: 12 mg/dL (ref 6–23)
CO2: 25 mEq/L (ref 19–32)
Chloride: 102 mEq/L (ref 96–112)
GFR calc Af Amer: >90 mL/min (ref >90)
Potassium: 3.6 mEq/L (ref 3.5–5.1)

## 2012-05-13 LAB — CBC
HCT: 32.6 % — ABNORMAL LOW (ref 36.0–46.0)
MCV: 81.5 fL (ref 78.0–100.0)
RBC: 4 MIL/uL (ref 3.87–5.11)
RDW: 15.7 % — ABNORMAL HIGH (ref 11.5–15.5)
WBC: 8.5 10*3/uL (ref 4.0–10.5)

## 2012-05-13 MED ORDER — HYDROMORPHONE HCL PF 1 MG/ML IJ SOLN
1.0000 mg | Freq: Once | INTRAMUSCULAR | Status: AC
  Administered 2012-05-14: 1 mg via INTRAVENOUS
  Filled 2012-05-13: qty 1

## 2012-05-13 MED ORDER — HYDROMORPHONE HCL PF 1 MG/ML IJ SOLN
1.0000 mg | Freq: Once | INTRAMUSCULAR | Status: AC
  Administered 2012-05-13: 1 mg via INTRAVENOUS
  Filled 2012-05-13: qty 1

## 2012-05-13 MED ORDER — SODIUM CHLORIDE 0.9 % IV BOLUS (SEPSIS): 1000.0000 mL | Freq: Once | INTRAVENOUS | Status: DC

## 2012-05-13 MED ORDER — PROMETHAZINE HCL 25 MG/ML IJ SOLN
25.0000 mg | Freq: Once | INTRAMUSCULAR | Status: AC
  Administered 2012-05-13: 25 mg via INTRAMUSCULAR
  Filled 2012-05-13: qty 1

## 2012-05-13 NOTE — ED Provider Notes (Signed)
History   This chart was scribed for Celese Banner B. Bernette Mayers, MD by [name] . The patient was seen in room APA03/APA03.    CSN: [id number]  Arrival date & time 05/13/12  2006   First MD Initiated Contact with Patient 05/13/12 2100      Chief Complaint  Patient presents with  . Abdominal Pain    (Consider location/radiation/quality/duration/timing/severity/associated sxs/prior treatment) HPI [name] is a 51 y.o. female who presents to the Emergency Department complaining of waxing and waning, moderate to severe abdominal pain for the past 2 days. Patient reports a h/o abdominal pain and states that today's episode is similar to prior episodes. Patient reports associated nausea, vomiting, dry mouth and a low grade fever. Patient states that her fever has not exceeded 100. Temperature here in ED is 97.9. Patient denies any diarrhea. Patient denies any hematemesis. Patient last vomited PTA, and is unsure of how many times she has vomited since onset. Patient states that she has been taking her medications regularly for her back pain and has not missed any doses.     Past Medical History  Diagnosis Date  . Hypertension   . Bipolar 1 disorder   . Depression   . Seizures   . Hypercholesteremia   . Acid reflux   . Chronic pain   . Stomach ulcer   . Headache   . Anxiety   . Crohn's disease     Past Surgical History  Procedure Date  . Cholecystectomy   . Tonsillectomy   . Stomach surgery     Family History  Problem Relation Age of Onset  . Hypertension Father   . Diabetes type II Brother   . Hypertension Brother   . Diabetes type II Mother     History  Substance Use Topics  . Smoking status: Never Smoker   . Smokeless tobacco: Not on file  . Alcohol Use: No    OB History    Grav Para Term Preterm Abortions TAB SAB Ect Mult Living                  Review of Systems  Constitutional: Positive for fever.  Gastrointestinal: Positive for nausea,  vomiting and abdominal pain. Negative for diarrhea.  Genitourinary: Negative for dysuria.  Musculoskeletal: Negative for back pain.  All other systems reviewed and are negative.    Allergies  Compazine; Penicillins; Aspirin; Cephalexin; Ketorolac tromethamine; Metoclopramide hcl; Ondansetron hcl; Sumatriptan; and Tetracycline  Home Medications   Current Outpatient Rx  Name Route Sig Dispense Refill  . ACETAMINOPHEN 500 MG PO TABS Oral Take 250 mg by mouth every 6 (six) hours as needed. pain    . ALPRAZOLAM 1 MG PO TABS Oral Take 1 mg by mouth 4 (four) times daily.     . ARIPIPRAZOLE 10 MG PO TABS Oral Take 10 mg by mouth at bedtime.     Marland Kitchen CLONIDINE HCL 0.1 MG PO TABS Oral Take 1 tablet (0.1 mg total) by mouth 2 (two) times daily. 1 tablet 0    Hold for 1 week then resume  . DULOXETINE HCL 60 MG PO CPEP Oral Take 60 mg by mouth daily.     Marland Kitchen HYDROCODONE-ACETAMINOPHEN 5-500 MG PO TABS Oral Take 1 tablet by mouth every 6 (six) hours as needed. Pain    . LABETALOL HCL 200 MG PO TABS Oral Take 1 tablet (200 mg total) by mouth 2 (two) times daily. 1 tablet 0    Hold for 1  week then resume  . LEVETIRACETAM 500 MG PO TABS Oral Take 500-1,000 mg by mouth 2 (two) times daily. Take 1 by mouth in the morning and take 2 at bedtime    . LUBIPROSTONE 8 MCG PO CAPS Oral Take 8 mcg by mouth 2 (two) times daily with a meal.     . OMEPRAZOLE 40 MG PO CPDR Oral Take 40 mg by mouth daily.     Marland Kitchen PREDNISONE 20 MG PO TABS Oral Take 3 tablets (60 mg total) by mouth daily. 15 tablet 0  . SIMVASTATIN 20 MG PO TABS Oral Take 20 mg by mouth at bedtime.     Marland Kitchen ZOLPIDEM TARTRATE 10 MG PO TABS Oral Take 10 mg by mouth at bedtime as needed. For sleep       BP 150/96  Pulse 105  Temp 97.9 F (36.6 C) (Oral)  Resp 16  Ht 5' 4.5" (1.638 m)  Wt 224 lb (101.606 kg)  BMI 37.86 kg/m2  SpO2 99%  Physical Exam  Nursing note and vitals reviewed. Constitutional: She is oriented to person, place, and time. She appears  well-developed and well-nourished. No distress.  HENT:  Head: Normocephalic and atraumatic.  Eyes: EOM are normal. Pupils are equal, round, and reactive to light.  Neck: Neck supple. No tracheal deviation present.  Cardiovascular: Normal rate, regular rhythm and normal heart sounds.   Pulmonary/Chest: Effort normal and breath sounds normal. No respiratory distress.  Abdominal: Soft. Bowel sounds are normal. She exhibits no distension and no mass. There is tenderness. There is no rebound and no guarding.       Diffusely tender throughout abdomen.   Musculoskeletal: Normal range of motion. She exhibits no edema.  Neurological: She is alert and oriented to person, place, and time. No sensory deficit.  Skin: Skin is warm and dry.  Psychiatric: She has a normal mood and affect. Her behavior is normal.    ED Course  Procedures (including critical care time)  DIAGNOSTIC STUDIES: Oxygen Saturation is 99% on room air, normal by my interpretation.    COORDINATION OF CARE:  2100: Medication Orders: Sodium chloride 0.9% bolus 1,000 mL-once.  2108: Discussed planned course of treatment with the patient, who is agreeable at this time.     Labs Reviewed  CBC - Abnormal; Notable for the following:    Hemoglobin 11.0 (*)     HCT 32.6 (*)     RDW 15.7 (*)     All other components within normal limits  BASIC METABOLIC PANEL  URINALYSIS, ROUTINE W REFLEX MICROSCOPIC   No results found.   No diagnosis found.    MDM  Pt with chronic abdominal pain. Labs normal. She reports vomiting all day. Nurses having difficulty getting IV, offered IM pain meds and oral hydration, but she wants to continue with IV attempts.   I personally performed the services described in the documentation, which were scribed in my presence. The recorded information has been reviewed and considered.      Pt given IVF and pain medications with some improvement. Requesting a second dose. Attempting PO trial now,  anticipate discharge soon.        Karen Kinnard B. Bernette Mayers, MD 05/14/12 0010

## 2012-05-13 NOTE — ED Notes (Signed)
abd pain for 3 days Nausea , vomiting , no diarrhea.  ? Fever.

## 2012-05-14 NOTE — ED Notes (Signed)
Drinking ginger ale   No vomiting  

## 2012-05-15 ENCOUNTER — Emergency Department (HOSPITAL_COMMUNITY)
Admission: EM | Admit: 2012-05-15 | Discharge: 2012-05-15 | Disposition: A | Discharge status: Home / Self Care | Payer: Medicare Other | Attending: Emergency Medicine | Admitting: Emergency Medicine

## 2012-05-15 ENCOUNTER — Encounter (HOSPITAL_COMMUNITY): Payer: Self-pay

## 2012-05-15 DIAGNOSIS — K219 Gastro-esophageal reflux disease without esophagitis: Secondary | ICD-10-CM | POA: Insufficient documentation

## 2012-05-15 DIAGNOSIS — Z880 Allergy status to penicillin: Secondary | ICD-10-CM | POA: Insufficient documentation

## 2012-05-15 DIAGNOSIS — G8929 Other chronic pain: Secondary | ICD-10-CM | POA: Insufficient documentation

## 2012-05-15 DIAGNOSIS — G40909 Epilepsy, unspecified, not intractable, without status epilepticus: Secondary | ICD-10-CM | POA: Insufficient documentation

## 2012-05-15 DIAGNOSIS — E7800 Pure hypercholesterolemia, unspecified: Secondary | ICD-10-CM | POA: Insufficient documentation

## 2012-05-15 DIAGNOSIS — I10 Essential (primary) hypertension: Secondary | ICD-10-CM | POA: Insufficient documentation

## 2012-05-15 DIAGNOSIS — K5090 Crohn's disease, unspecified, without complications: Secondary | ICD-10-CM | POA: Insufficient documentation

## 2012-05-15 DIAGNOSIS — F411 Generalized anxiety disorder: Secondary | ICD-10-CM | POA: Insufficient documentation

## 2012-05-15 DIAGNOSIS — R109 Unspecified abdominal pain: Secondary | ICD-10-CM | POA: Insufficient documentation

## 2012-05-15 DIAGNOSIS — F319 Bipolar disorder, unspecified: Secondary | ICD-10-CM | POA: Insufficient documentation

## 2012-05-15 DIAGNOSIS — R1110 Vomiting, unspecified: Secondary | ICD-10-CM | POA: Insufficient documentation

## 2012-05-15 MED ORDER — PROMETHAZINE HCL 25 MG PO TABS: 25.0000 mg | ORAL_TABLET | Freq: Four times a day (QID) | ORAL | Status: DC | PRN

## 2012-05-15 MED ORDER — PROMETHAZINE HCL 25 MG/ML IJ SOLN
25.0000 mg | Freq: Once | INTRAMUSCULAR | Status: AC
  Administered 2012-05-15: 25 mg via INTRAMUSCULAR
  Filled 2012-05-15: qty 1

## 2012-05-15 MED ORDER — HYDROMORPHONE HCL PF 2 MG/ML IJ SOLN
2.0000 mg | Freq: Once | INTRAMUSCULAR | Status: AC
  Administered 2012-05-15: 2 mg via INTRAMUSCULAR
  Filled 2012-05-15: qty 1

## 2012-05-15 NOTE — Discharge Instructions (Signed)

## 2012-05-15 NOTE — ED Notes (Addendum)
Pt reports continued nausea, vomiting, diarrhea, and abdominal pain.  Reports symptoms x2 days, and was seen previously for same.  Not improved.

## 2012-05-15 NOTE — ED Notes (Signed)
Pt alert & oriented x4, stable gait. Pt given discharge instructions, paperwork & prescription(s). Patient instructed to stop at the registration desk to finish any additional paperwork. pt verbalized understanding. Pt left department w/ no further questions.  

## 2012-05-15 NOTE — ED Notes (Signed)
Abdominal pain, vomiting all day today. Was here the last night for the same per pt.

## 2012-05-15 NOTE — ED Provider Notes (Signed)
History   This chart was scribed for Breslyn Abdo B. Bernette Mayers, MD scribed by Magnus Sinning. The patient was seen in room APA09/APA09 seen at 19:45    CSN: [id number]  Arrival date & time 05/15/12  4098   First MD Initiated Contact with Patient 05/15/12 1940      Chief Complaint  Patient presents with  . Abdominal Pain  . Emesis    (Consider location/radiation/quality/duration/timing/severity/associated sxs/prior treatment) HPI [name] is a 51 y.o. female who presents to the Emergency Department complaining of constant moderate chronic abd pain with associated emesis, onset 4 days. Patient was seen two days ago in the ED and given pain medication. Lab results of past visit were all nml, and patient was d/c. Patient states that today's pain is the same pain as two days ago. Pt also says she's been drinking a lot of fluids, in particularly guava juice. She denies any other associated sxs at this time.  Past Medical History  Diagnosis Date  . Hypertension   . Bipolar 1 disorder   . Depression   . Seizures   . Hypercholesteremia   . Acid reflux   . Chronic pain   . Stomach ulcer   . Headache   . Anxiety   . Crohn's disease     Past Surgical History  Procedure Date  . Cholecystectomy   . Tonsillectomy   . Stomach surgery     Family History  Problem Relation Age of Onset  . Hypertension Father   . Diabetes type II Brother   . Hypertension Brother   . Diabetes type II Mother     History  Substance Use Topics  . Smoking status: Never Smoker   . Smokeless tobacco: Not on file  . Alcohol Use: No   Review of Systems 10 Systems reviewed and are negative for acute change except as noted in the HPI. Allergies  Compazine; Penicillins; Aspirin; Cephalexin; Ketorolac tromethamine; Metoclopramide hcl; Ondansetron hcl; Sumatriptan; and Tetracycline  Home Medications   Current Outpatient Rx  Name Route Sig Dispense Refill  . ACETAMINOPHEN 500 MG PO TABS Oral Take 250 mg  by mouth every 6 (six) hours as needed. pain    . ALPRAZOLAM 1 MG PO TABS Oral Take 1 mg by mouth 4 (four) times daily.     . ARIPIPRAZOLE 10 MG PO TABS Oral Take 10 mg by mouth at bedtime.     Marland Kitchen CLONIDINE HCL 0.1 MG PO TABS Oral Take 1 tablet (0.1 mg total) by mouth 2 (two) times daily. 1 tablet 0    Hold for 1 week then resume  . DULOXETINE HCL 60 MG PO CPEP Oral Take 60 mg by mouth daily.     Marland Kitchen HYDROCODONE-ACETAMINOPHEN 5-500 MG PO TABS Oral Take 1 tablet by mouth 2 (two) times daily as needed. Pain    . LABETALOL HCL 200 MG PO TABS Oral Take 200 mg by mouth 2 (two) times daily.    Marland Kitchen LEVETIRACETAM 500 MG PO TABS Oral Take 500-1,000 mg by mouth 2 (two) times daily. Take 1 by mouth in the morning and take 2 at bedtime    . LUBIPROSTONE 8 MCG PO CAPS Oral Take 8 mcg by mouth 2 (two) times daily with a meal.     . ZOLPIDEM TARTRATE 10 MG PO TABS Oral Take 10 mg by mouth at bedtime as needed. For sleep       BP 153/119  Pulse 118  Temp 98.3 F (36.8 C) (Oral)  Resp 24  Ht 5\' 4"  (1.626 m)  Wt 220 lb (99.791 kg)  BMI 37.76 kg/m2  SpO2 99%  Physical Exam  Nursing note and vitals reviewed. Constitutional: She is oriented to person, place, and time. She appears well-developed and well-nourished.  HENT:  Head: Normocephalic and atraumatic.  Neck: Neck supple.  Pulmonary/Chest: Effort normal.  Abdominal: Soft. She exhibits no mass. There is tenderness. There is no rebound and no guarding.       Diffusely tender  Musculoskeletal: Normal range of motion.  Neurological: She is alert and oriented to person, place, and time. No cranial nerve deficit.  Skin: Skin is warm and dry.  Psychiatric: She has a normal mood and affect. Her behavior is normal.    ED Course  Procedures (including critical care time) DIAGNOSTIC STUDIES: Oxygen Saturation is 99% on room air, normal by my interpretation.    COORDINATION OF CARE: 20:39: EDMD performs recheck. Patient still reports complaints of pain  and nausea.  Labs Reviewed - No data to display No results found.   No diagnosis found.    MDM  Chronic abdominal pain with multiple prior ED visits. Pt was seen 2 days ago by me for same. Difficult IV stick (took about 2 hours). Advised patient that we would treat her with IM meds and PO fluids in the ED for this visit.  I personally performed the services described in the documentation, which were scribed in my presence. The recorded information has been reviewed and considered.    8:45 PM Pt dressed and sitting on the side of the bed. Drinking coke and feeling better. Advised that I would not prescribe narcotics for her that we could give her antiemetics.       Azarius Lambson B. Bernette Mayers, MD 05/15/12 2047

## 2012-05-18 ENCOUNTER — Emergency Department (HOSPITAL_COMMUNITY)
Admission: EM | Admit: 2012-05-18 | Discharge: 2012-05-18 | Disposition: A | Discharge status: Home / Self Care | Payer: Medicare Other | Attending: Emergency Medicine | Admitting: Emergency Medicine

## 2012-05-18 ENCOUNTER — Encounter (HOSPITAL_COMMUNITY): Payer: Self-pay | Admitting: *Deleted

## 2012-05-18 DIAGNOSIS — R109 Unspecified abdominal pain: Secondary | ICD-10-CM

## 2012-05-18 DIAGNOSIS — Z880 Allergy status to penicillin: Secondary | ICD-10-CM | POA: Insufficient documentation

## 2012-05-18 DIAGNOSIS — F411 Generalized anxiety disorder: Secondary | ICD-10-CM | POA: Insufficient documentation

## 2012-05-18 DIAGNOSIS — R1110 Vomiting, unspecified: Secondary | ICD-10-CM

## 2012-05-18 DIAGNOSIS — K219 Gastro-esophageal reflux disease without esophagitis: Secondary | ICD-10-CM | POA: Insufficient documentation

## 2012-05-18 DIAGNOSIS — G8929 Other chronic pain: Secondary | ICD-10-CM | POA: Insufficient documentation

## 2012-05-18 DIAGNOSIS — G40909 Epilepsy, unspecified, not intractable, without status epilepticus: Secondary | ICD-10-CM | POA: Insufficient documentation

## 2012-05-18 DIAGNOSIS — F319 Bipolar disorder, unspecified: Secondary | ICD-10-CM | POA: Insufficient documentation

## 2012-05-18 DIAGNOSIS — K5090 Crohn's disease, unspecified, without complications: Secondary | ICD-10-CM | POA: Insufficient documentation

## 2012-05-18 DIAGNOSIS — I10 Essential (primary) hypertension: Secondary | ICD-10-CM | POA: Insufficient documentation

## 2012-05-18 DIAGNOSIS — E7800 Pure hypercholesterolemia, unspecified: Secondary | ICD-10-CM | POA: Insufficient documentation

## 2012-05-18 DIAGNOSIS — Z886 Allergy status to analgesic agent status: Secondary | ICD-10-CM | POA: Insufficient documentation

## 2012-05-18 LAB — URINALYSIS, ROUTINE W REFLEX MICROSCOPIC
Bilirubin Urine: NEGATIVE
Ketones, ur: NEGATIVE mg/dL
Nitrite: NEGATIVE
Protein, ur: NEGATIVE mg/dL
Urobilinogen, UA: 0.2 mg/dL (ref 0.0–1.0)

## 2012-05-18 MED ORDER — PROMETHAZINE HCL 12.5 MG PO TABS
25.0000 mg | ORAL_TABLET | Freq: Once | ORAL | Status: AC
  Administered 2012-05-18: 25 mg via ORAL
  Filled 2012-05-18: qty 2

## 2012-05-18 MED ORDER — OXYCODONE-ACETAMINOPHEN 5-325 MG PO TABS
1.0000 | ORAL_TABLET | Freq: Once | ORAL | Status: AC
  Administered 2012-05-18: 1 via ORAL
  Filled 2012-05-18: qty 1

## 2012-05-18 NOTE — ED Notes (Signed)
Lt abd pain ,NVD.  Chills.  Seen here recently for same sx.

## 2012-05-18 NOTE — Discharge Instructions (Signed)

## 2012-05-18 NOTE — ED Provider Notes (Signed)
History  This chart was scribed for Danielle Gaskins, MD by Bennett Scrape. This patient was seen in room APA03/APA03 and the patient's care was started at 11:43AM.  CSN: [id number]  Arrival date & time 05/18/12  1041   First MD Initiated Contact with Patient 05/18/12 1143      Chief Complaint  Patient presents with  . Abdominal Pain    Patient is a 51 y.o. female presenting with abdominal pain. The history is provided by the patient. No language interpreter was used.  Abdominal Pain The current episode started more than 2 days ago. The onset of the illness was gradual. The problem has been gradually worsening.  Significant associated medical issues include GERD.    [name] is a 51 y.o. female with a h/o chronic abdominal pain who presents to the Emergency Department complaining of 3 days of constant, non-radiating LLQ abdominal pain with associated emesis, diarrhea and fevers of 101 at home. Pt does not currently have a fever in the ED. She also lists occasional chest tightness and SOB as associated symptoms but none at this time. He denies having any modifying factors. She denies taking OTC medications at home to improve symptoms. She states that she gets similar abdominal pain 2 to 3 times a month. She reports that she has followed up with GI specialist Dr. Suezanne Jacquet in [place] and was told that she needed to go one hour away for further testing. She reports that she is due for a colonoscopy. She denies dysruia, vaginal bleeding, back pain, rash, agitation, and weakness as associated symptoms. She has a h/o HTN, bipolar disorder, depression, seizures, GERD, and HA and states that she has been able to keep her medicines down. She denies smoking and alcohol use.  Pt was seen here on 05/15/12 for the same.  No PCP currently.  Past Medical History  Diagnosis Date  . Hypertension   . Bipolar 1 disorder   . Depression   . Seizures   . Hypercholesteremia   . Acid reflux   . Chronic  pain   . Stomach ulcer   . Headache   . Anxiety   . Crohn's disease     Past Surgical History  Procedure Date  . Cholecystectomy   . Tonsillectomy   . Stomach surgery   . Tubal ligation   partial gastrectomy   Family History  Problem Relation Age of Onset  . Hypertension Father   . Diabetes type II Brother   . Hypertension Brother   . Diabetes type II Mother     History  Substance Use Topics  . Smoking status: Never Smoker   . Smokeless tobacco: Not on file  . Alcohol Use: No     Review of Systems   A complete 10 system review of systems was obtained and all systems are negative except as noted in the HPI and PMH.   Allergies  Compazine; Penicillins; Aspirin; Cephalexin; Ketorolac tromethamine; Metoclopramide hcl; Ondansetron hcl; Sumatriptan; and Tetracycline  Home Medications   Current Outpatient Rx  Name Route Sig Dispense Refill  . ACETAMINOPHEN 500 MG PO TABS Oral Take 250 mg by mouth every 6 (six) hours as needed. pain    . ALPRAZOLAM 1 MG PO TABS Oral Take 1 mg by mouth 4 (four) times daily.     . ARIPIPRAZOLE 10 MG PO TABS Oral Take 10 mg by mouth at bedtime.     Marland Kitchen CLONIDINE HCL 0.1 MG PO TABS Oral Take 1 tablet (0.1  mg total) by mouth 2 (two) times daily. 1 tablet 0    Hold for 1 week then resume  . DULOXETINE HCL 60 MG PO CPEP Oral Take 60 mg by mouth daily.     Marland Kitchen LABETALOL HCL 200 MG PO TABS Oral Take 200 mg by mouth 2 (two) times daily.    Marland Kitchen LEVETIRACETAM 500 MG PO TABS Oral Take 500-1,000 mg by mouth 2 (two) times daily. Take 1 by mouth in the morning and take 2 at bedtime    . LUBIPROSTONE 8 MCG PO CAPS Oral Take 8 mcg by mouth 2 (two) times daily with a meal.     . PROMETHAZINE HCL 25 MG PO TABS Oral Take 25 mg by mouth every 6 (six) hours as needed. For nausea    . ZOLPIDEM TARTRATE 10 MG PO TABS Oral Take 10 mg by mouth at bedtime as needed. For sleep       Triage Vitals: BP 153/101  Pulse 116  Temp 98.3 F (36.8 C) (Oral)  Resp 20  Ht 5'  4" (1.626 m)  Wt 250 lb (113.399 kg)  BMI 42.91 kg/m2  SpO2 98%  Physical Exam  Nursing note and vitals reviewed.  CONSTITUTIONAL: Well developed/well nourished HEAD AND FACE: Normocephalic/atraumatic EYES: EOMI/PERRL, no scleral icterus ENMT: Mucous membranes moist NECK: supple no meningeal signs SPINE:entire spine nontender CV: S1/S2 noted, no murmurs/rubs/gallops noted LUNGS: Lungs are clear to auscultation bilaterally, no apparent distress ABDOMEN: soft, nontender, no rebound or guarding GU:no cva tenderness NEURO: Pt is awake/alert, moves all extremitiesx4 EXTREMITIES: pulses normal, full ROM SKIN: warm, color normal PSYCH: no abnormalities of mood noted  ED Course  Procedures   DIAGNOSTIC STUDIES: Oxygen Saturation is 98% on room air, normal by my interpretation.    COORDINATION OF CARE: 12:00PM-Discussed treatment plan with pt and pt agreed to plan. Advised pt that she will have to follow up with a GI.  Pt here with chronic pain, advised to f/u as outpatient.  Her abd exam is benign.  She was ambulatory in the ED   The patient appears reasonably screened and/or stabilized for discharge and I doubt any other medical condition or other [facility] requiring further screening, evaluation, or treatment in the ED at this time prior to discharge.    Labs Reviewed  URINALYSIS, ROUTINE W REFLEX MICROSCOPIC    MDM  Nursing notes including past medical history and social history reviewed and considered in documentation All labs/vitals reviewed and considered Previous records reviewed and considered - recent visits for abdominal pain       I personally performed the services described in this documentation, which was scribed in my presence. The recorded information has been reviewed and considered.      Danielle Gaskins, MD 05/18/12 [phone]

## 2012-05-18 NOTE — ED Notes (Signed)
Pt c/o lower abd pain x 2 weeks.  Reports nauseated today, no vomiting.  Says vomited a few days ago, and diarrhea x 3 today.

## 2012-05-19 ENCOUNTER — Telehealth: Payer: Self-pay | Admitting: Gastroenterology

## 2012-05-19 NOTE — Telephone Encounter (Signed)
[name], if the patient calls back, I will speak to her.

## 2012-05-19 NOTE — Telephone Encounter (Signed)
Patient has called numerous times trying to make OV and she has been told multiple times that she has been discharged from our practice. Dr [facility] office in [place] called earlier this week to retrieve her medical records.

## 2012-05-19 NOTE — Telephone Encounter (Addendum)
Pt discharged from practice May 15, 2010 FOR > 7 NOS SHOWS IN ONE YEAR. PT SHOULD SEE DR. Teena Dunk OR [facility].

## 2012-06-13 ENCOUNTER — Encounter (HOSPITAL_COMMUNITY): Payer: Self-pay | Admitting: *Deleted

## 2012-06-13 ENCOUNTER — Emergency Department (HOSPITAL_COMMUNITY)
Admission: EM | Admit: 2012-06-13 | Discharge: 2012-06-14 | Disposition: A | Discharge status: Home / Self Care | Payer: Medicare Other | Attending: Emergency Medicine | Admitting: Emergency Medicine

## 2012-06-13 ENCOUNTER — Emergency Department (HOSPITAL_COMMUNITY): Payer: Medicare Other

## 2012-06-13 DIAGNOSIS — K5900 Constipation, unspecified: Secondary | ICD-10-CM | POA: Insufficient documentation

## 2012-06-13 DIAGNOSIS — R109 Unspecified abdominal pain: Secondary | ICD-10-CM | POA: Insufficient documentation

## 2012-06-13 DIAGNOSIS — I10 Essential (primary) hypertension: Secondary | ICD-10-CM | POA: Insufficient documentation

## 2012-06-13 DIAGNOSIS — M549 Dorsalgia, unspecified: Secondary | ICD-10-CM | POA: Insufficient documentation

## 2012-06-13 DIAGNOSIS — Z9884 Bariatric surgery status: Secondary | ICD-10-CM | POA: Insufficient documentation

## 2012-06-13 DIAGNOSIS — N390 Urinary tract infection, site not specified: Secondary | ICD-10-CM

## 2012-06-13 LAB — CBC WITH DIFFERENTIAL/PLATELET
Basophils Relative: 0 % (ref 0–1)
Eosinophils Absolute: 0.3 10*3/uL (ref 0.0–0.7)
Hemoglobin: 10.1 g/dL — ABNORMAL LOW (ref 12.0–15.0)
MCH: 27 pg (ref 26.0–34.0)
MCHC: 33 g/dL (ref 30.0–36.0)
Monocytes Absolute: 0.6 10*3/uL (ref 0.1–1.0)
Monocytes Relative: 6 % (ref 3–12)
Neutrophils Relative %: 56 % (ref 43–77)

## 2012-06-13 LAB — COMPREHENSIVE METABOLIC PANEL
Albumin: 3.7 g/dL (ref 3.5–5.2)
BUN: 16 mg/dL (ref 6–23)
Calcium: 9.2 mg/dL (ref 8.4–10.5)
Creatinine, Ser: 0.91 mg/dL (ref 0.50–1.10)
Potassium: 3.8 mEq/L (ref 3.5–5.1)
Total Protein: 7 g/dL (ref 6.0–8.3)

## 2012-06-13 LAB — URINALYSIS, ROUTINE W REFLEX MICROSCOPIC
Glucose, UA: NEGATIVE mg/dL
Hgb urine dipstick: NEGATIVE
Specific Gravity, Urine: 1.01 (ref 1.005–1.030)
Urobilinogen, UA: 0.2 mg/dL (ref 0.0–1.0)
pH: 5.5 (ref 5.0–8.0)

## 2012-06-13 LAB — URINE MICROSCOPIC-ADD ON

## 2012-06-13 LAB — LIPASE, BLOOD: Lipase: 55 U/L (ref 11–59)

## 2012-06-13 MED ORDER — POLYETHYLENE GLYCOL 3350 17 GM/SCOOP PO POWD: 17.0000 g | Freq: Every day | ORAL | Status: AC

## 2012-06-13 MED ORDER — PROMETHAZINE HCL 25 MG/ML IJ SOLN
25.0000 mg | Freq: Once | INTRAMUSCULAR | Status: AC
  Administered 2012-06-13: 25 mg via INTRAVENOUS
  Filled 2012-06-13: qty 1

## 2012-06-13 MED ORDER — HYDROMORPHONE HCL PF 2 MG/ML IJ SOLN
2.0000 mg | Freq: Once | INTRAMUSCULAR | Status: AC
  Administered 2012-06-13: 2 mg via INTRAVENOUS
  Filled 2012-06-13: qty 1

## 2012-06-13 MED ORDER — ONDANSETRON HCL 4 MG/2ML IJ SOLN: 4.0000 mg | Freq: Once | INTRAMUSCULAR | Status: DC

## 2012-06-13 MED ORDER — HYDROCODONE-ACETAMINOPHEN 5-325 MG PO TABS: 2.0000 | ORAL_TABLET | ORAL | Status: AC | PRN

## 2012-06-13 MED ORDER — SODIUM CHLORIDE 0.9 % IV BOLUS (SEPSIS)
1000.0000 mL | Freq: Once | INTRAVENOUS | Status: AC
  Administered 2012-06-13: 1000 mL via INTRAVENOUS

## 2012-06-13 MED ORDER — CIPROFLOXACIN HCL 250 MG PO TABS
500.0000 mg | ORAL_TABLET | Freq: Once | ORAL | Status: AC
  Administered 2012-06-14: 500 mg via ORAL
  Filled 2012-06-13: qty 2

## 2012-06-13 MED ORDER — CIPROFLOXACIN HCL 500 MG PO TABS: 500.0000 mg | ORAL_TABLET | Freq: Two times a day (BID) | ORAL | Status: AC

## 2012-06-13 NOTE — ED Provider Notes (Cosign Needed)
History     CSN: [id number]  Arrival date & time 06/13/12  1859   First MD Initiated Contact with Patient 06/13/12 1945      Chief Complaint  Patient presents with  . Back Pain    (Consider location/radiation/quality/duration/timing/severity/associated sxs/prior treatment) HPI Comments: Patient is a 51 year old woman who says that she has developed pain in the back, it radiates into her lower abdomen. It is more on the left than on the right side. She has no surgery UTI or a kidney stone. She has had multiple prior abdominal operations including cholecystectomy, partial gastric resection.there is also a history of bipolar disorder.  Patient is a 51 y.o. female presenting with flank pain. The history is provided by the patient. No language interpreter was used.  Flank Pain The problem occurs constantly. The problem has been gradually worsening. Associated symptoms include abdominal pain. Nothing aggravates the symptoms. Nothing relieves the symptoms. She has tried nothing for the symptoms.    Past Medical History  Diagnosis Date  . Hypertension   . Bipolar 1 disorder   . Depression   . Seizures   . Hypercholesteremia   . Acid reflux   . Chronic pain   . Stomach ulcer   . Headache   . Anxiety   . Crohn's disease     Past Surgical History  Procedure Date  . Cholecystectomy   . Tonsillectomy   . Stomach surgery   . Tubal ligation     Family History  Problem Relation Age of Onset  . Hypertension Father   . Diabetes type II Brother   . Hypertension Brother   . Diabetes type II Mother     History  Substance Use Topics  . Smoking status: Never Smoker   . Smokeless tobacco: Not on file  . Alcohol Use: No    OB History    Grav Para Term Preterm Abortions TAB SAB Ect Mult Living                  Review of Systems  Constitutional: Positive for chills. Negative for fever.  HENT: Negative.   Eyes: Negative.   Respiratory: Negative.   Cardiovascular: Negative.    Gastrointestinal: Positive for nausea and abdominal pain. Negative for vomiting and diarrhea.  Genitourinary: Positive for flank pain.  Musculoskeletal: Positive for back pain.  Neurological: Negative.   Psychiatric/Behavioral: Negative.     Allergies  Compazine; Penicillins; Aspirin; Cephalexin; Ketorolac tromethamine; Metoclopramide hcl; Ondansetron hcl; Sumatriptan; and Tetracycline  Home Medications   Current Outpatient Rx  Name Route Sig Dispense Refill  . ACETAMINOPHEN 500 MG PO TABS Oral Take 500-1,000 mg by mouth daily as needed. pain    . ALPRAZOLAM 1 MG PO TABS Oral Take 1 mg by mouth 4 (four) times daily.     . ARIPIPRAZOLE 10 MG PO TABS Oral Take 10 mg by mouth at bedtime.     Marland Kitchen CLONIDINE HCL 0.1 MG PO TABS Oral Take 0.1 mg by mouth at bedtime.    . DULOXETINE HCL 60 MG PO CPEP Oral Take 60 mg by mouth every morning.     Marland Kitchen LABETALOL HCL 200 MG PO TABS Oral Take 200 mg by mouth 2 (two) times daily.    Marland Kitchen LEVETIRACETAM 500 MG PO TABS Oral Take 500-1,000 mg by mouth 2 (two) times daily. **Take one tablet daily in the morning and two tablets at bedtime**    . LUBIPROSTONE 8 MCG PO CAPS Oral Take 8 mcg by mouth 2 (  two) times daily with a meal.     . ZOLPIDEM TARTRATE 10 MG PO TABS Oral Take 10 mg by mouth at bedtime as needed. For sleep       BP 134/85  Pulse 98  Temp 98.1 F (36.7 C) (Oral)  Resp 20  Ht 5' 4.5" (1.638 m)  Wt 200 lb (90.719 kg)  BMI 33.80 kg/m2  SpO2 99%  Physical Exam  Nursing note and vitals reviewed. Constitutional: She is oriented to person, place, and time. She appears well-developed and well-nourished.       In moderate distress complaining of back pain.  HENT:  Head: Normocephalic and atraumatic.  Right Ear: External ear normal.  Left Ear: External ear normal.  Mouth/Throat: Oropharynx is clear and moist.  Eyes: Conjunctivae and EOM are normal. Pupils are equal, round, and reactive to light. No scleral icterus.  Neck: Normal range of  motion. Neck supple.  Cardiovascular: Normal rate and normal heart sounds.   Pulmonary/Chest: Effort normal and breath sounds normal.  Abdominal: Soft. Bowel sounds are normal.       Her abdomen seems somewhat bloated and distended, but is nontender, with no rebound rigidity or mass.  Musculoskeletal:       Localizes pain to the costovertebral angle region on both sides. The left is worse than on the right. There is no point tenderness.  Neurological: She is alert and oriented to person, place, and time.       No sensory or motor deficit.  Skin: Skin is warm and dry.  Psychiatric: She has a normal mood and affect. Her behavior is normal.    ED Course  Procedures (including critical care time)  Labs Reviewed  URINALYSIS, ROUTINE W REFLEX MICROSCOPIC - Abnormal; Notable for the following:    Leukocytes, UA MODERATE (*)     All other components within normal limits  CBC WITH DIFFERENTIAL - Abnormal; Notable for the following:    RBC 3.74 (*)     Hemoglobin 10.1 (*)     HCT 30.6 (*)     All other components within normal limits  COMPREHENSIVE METABOLIC PANEL - Abnormal; Notable for the following:    Glucose, Bld 106 (*)     Total Bilirubin 0.2 (*)     GFR calc non Af Amer 71 (*)     GFR calc Af Amer 83 (*)     All other components within normal limits  URINE MICROSCOPIC-ADD ON - Abnormal; Notable for the following:    Squamous Epithelial / LPF MANY (*)     Bacteria, UA FEW (*)     All other components within normal limits  LIPASE, BLOOD  URINE CULTURE   10:38 PM Pt seen --> physical examination performed.  Lab workup suggests UTI, though where she has abdominal distention will get non-contrast abdominal CT to further evaluate.  If that is negative, plan to treat with ABX, mild pain meds.   11:36 PM noncontrast abdominal CT shows constipation to my reading, no serious intra-abdominal finding. Will add Miralax to her regimen.   1. Urinary tract infection   2. Constipation          Carleene Cooper III, MD 06/14/12 1322

## 2012-06-13 NOTE — ED Notes (Signed)
Low back pain for 3 day.   Voiding small amts.

## 2012-06-13 NOTE — ED Provider Notes (Signed)
MSE was initiated and I personally evaluated the patient and placed orders (if any) at  8:19 PM on June 13, 2012.  The remainder of the MSE may be completed by another ED provider.   Patient presents with a three-day history of bilateral flank pain, abdominal pain, nausea and low-grade fever.  She has had decreased by mouth intake and decreased urination.  Patient reports symptoms are consistent with prior flareups of her Crohn's disease.  Vital signs reviewed and stable.  Abdomen is distended without increased tympany, tender to palpation in left upper quadrant.  Patient also has bilateral flank tenderness.  Bowel sounds are decreased.  Lungs clear, heart rate regular rhythm without murmur rub or gallop.  Oropharynx appears dry.  Labs ordered, IV fluids ordered along with Phenergan.  Burgess Amor, PA 06/13/12 2020  Burgess Amor, PA 06/13/12 2022

## 2012-06-14 LAB — URINE CULTURE: Culture: NO GROWTH

## 2012-06-15 NOTE — ED Provider Notes (Signed)
Medical screening examination/treatment/procedure(s) were performed by non-physician practitioner and as supervising physician I was immediately available for consultation/collaboration.   Laray Anger, DO 06/15/12 1404

## 2012-07-07 IMAGING — CT CT ABD-PELV W/O CM
2 of 4 series · 16 of 46 positions shown, 18 images · non-contrast
Comparison: 11/10/2011

CLINICAL DATA: right lower quadrant pain, status post
cholecystectomy prior gastric surgery for ulcer

CT ABDOMEN AND PELVIS WITHOUT CONTRAST
TECHNIQUE: Multidetector CT imaging of the abdomen and pelvis was
performed following the standard protocol without intravenous
contrast.

[Series 2: abdomen/pelvis w/o contrast · axial · non-contrast · 0.83mm/px · z∈[-546,-116]mm · 13 of 94 slices shown, 15 images]
[im 4/94  soft-tissue]
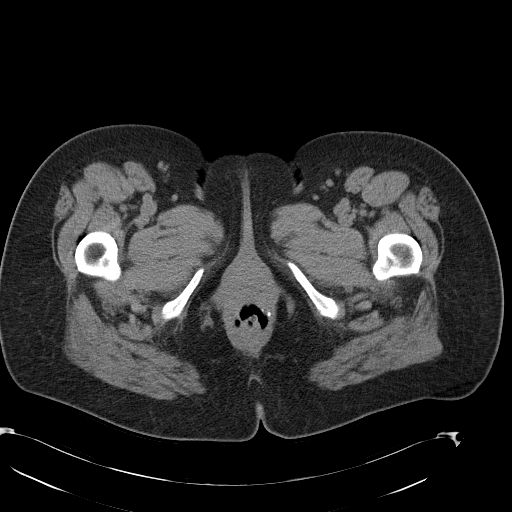
[im 4/94  bone]
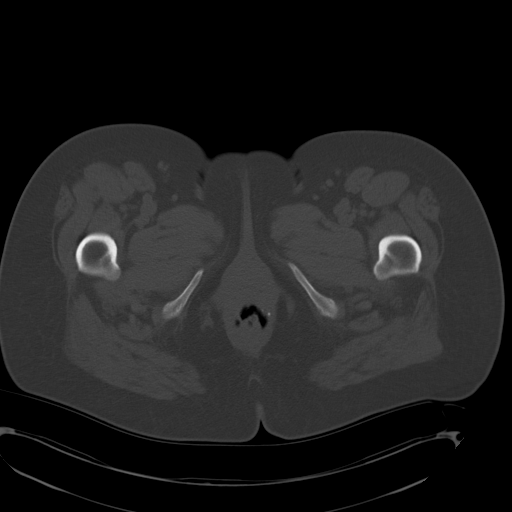
[im 12/94  soft-tissue]
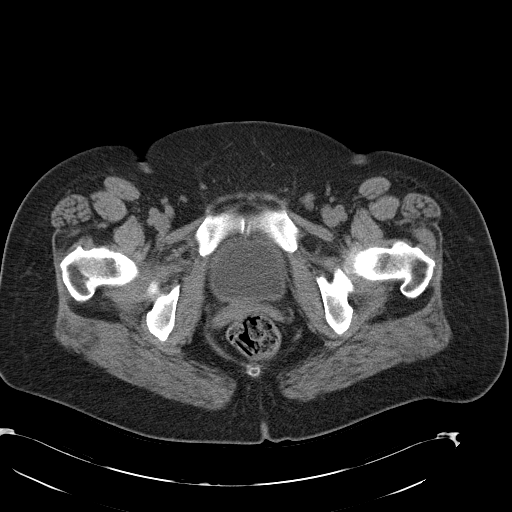
[im 20/94  soft-tissue]
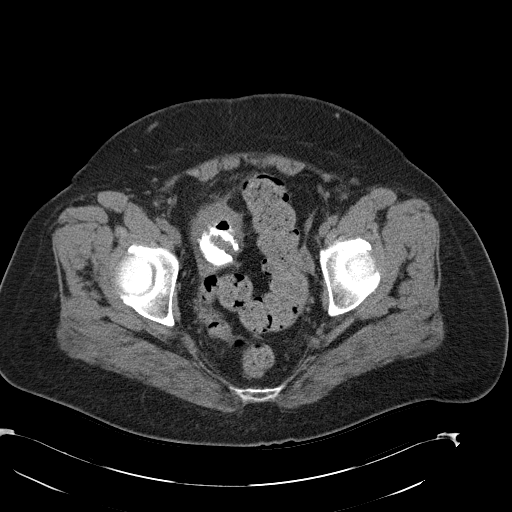
[im 28/94  soft-tissue]
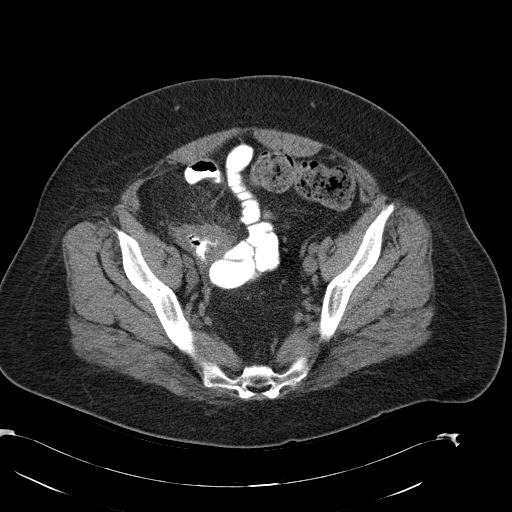
[im 32/94  soft-tissue]
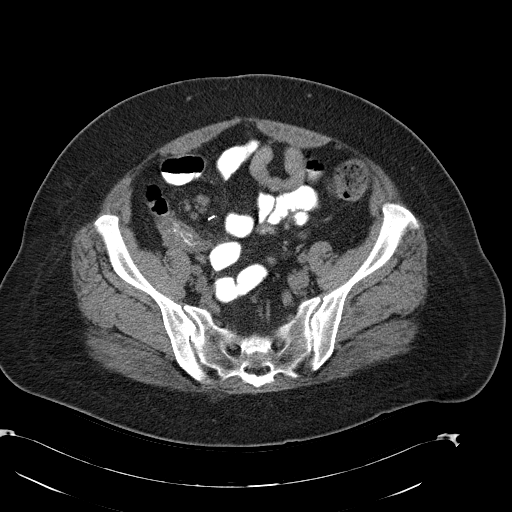
[im 39/94  soft-tissue]
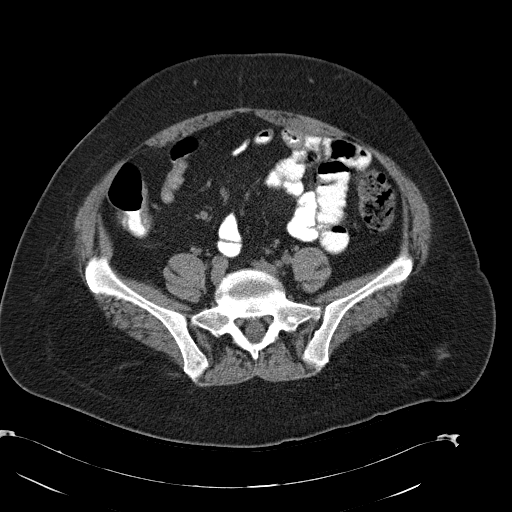
[im 47/94  soft-tissue]
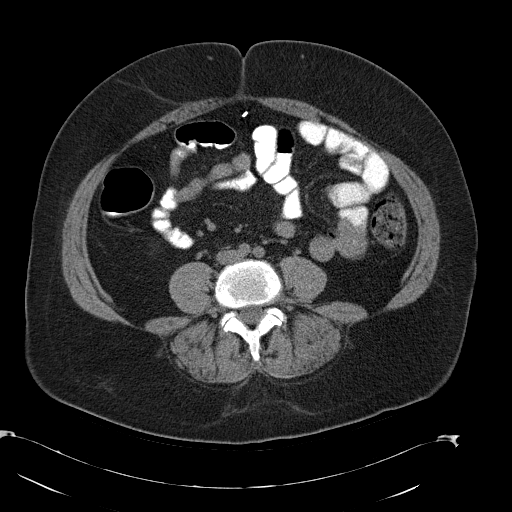
[im 55/94  soft-tissue]
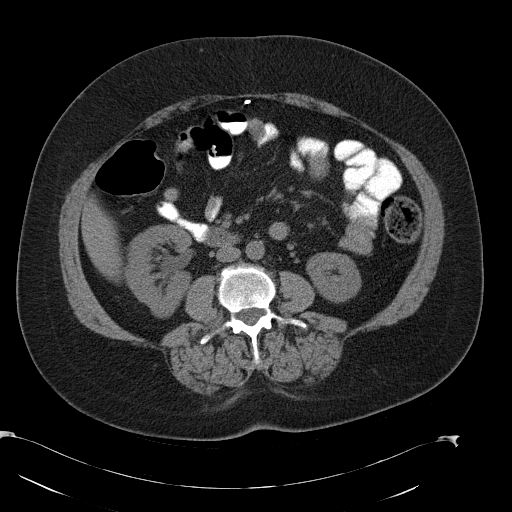
[im 63/94  soft-tissue]
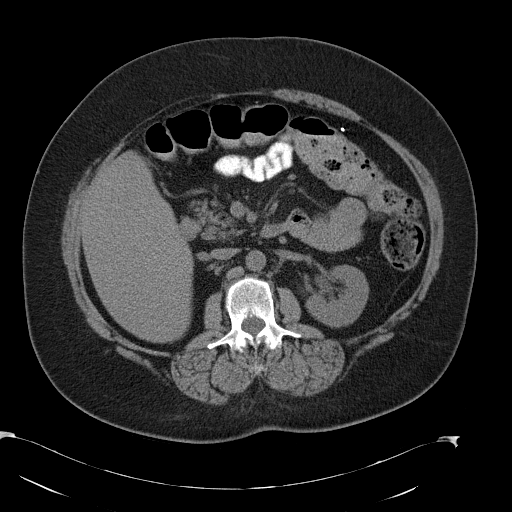
[im 63/94  bone]
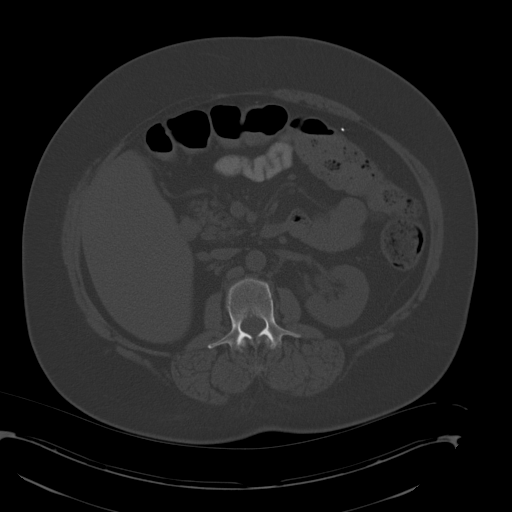
[im 66/94  soft-tissue]
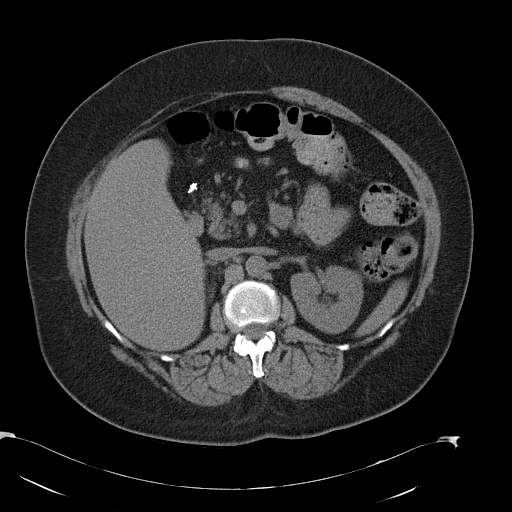
[im 74/94  soft-tissue]
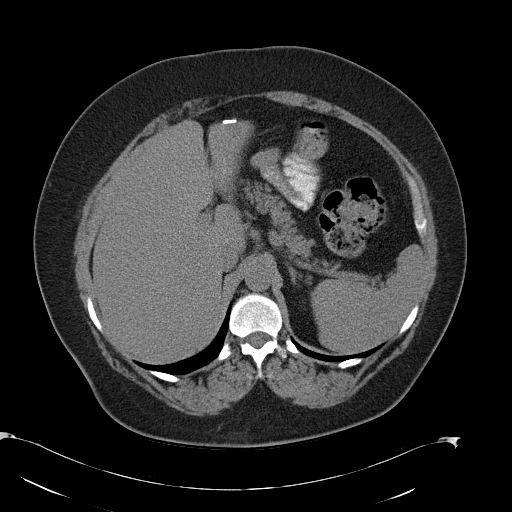
[im 82/94  soft-tissue]
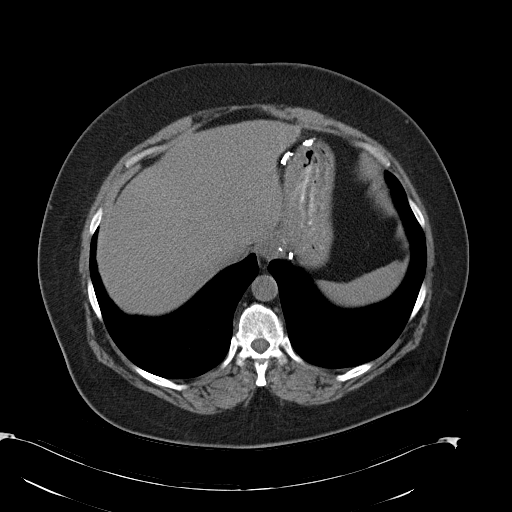
[im 90/94  soft-tissue]
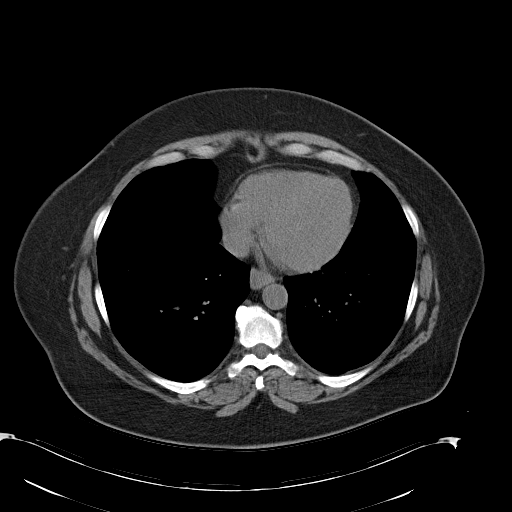

[Series 4: mpr cor (id) · coronal · 0.77mm/px · 3 of 107 slices shown]
[im 36/107  soft-tissue]
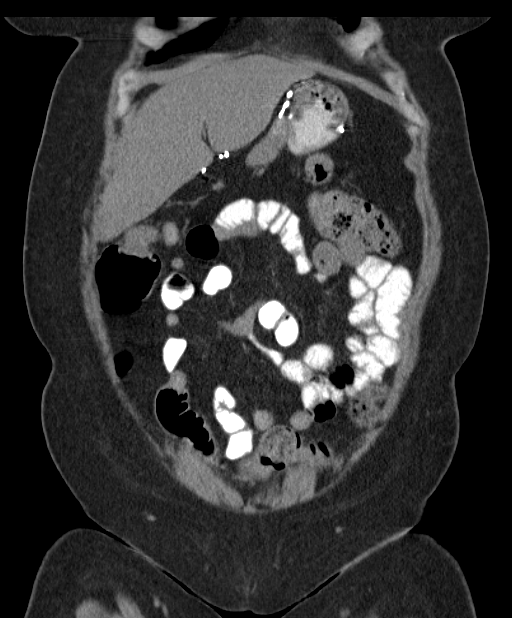
[im 48/107  soft-tissue]
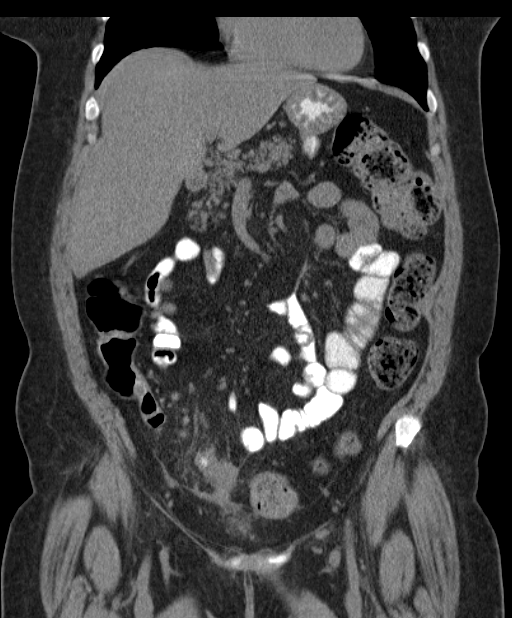
[im 59/107  soft-tissue]
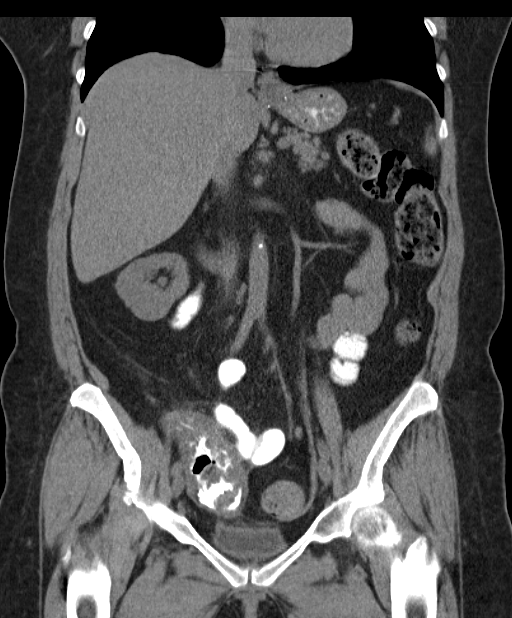

[16 of 46 positions shown; findings below may reference images not displayed]

FINDINGS: Lung bases are unremarkable.  Sagittal images of the
spine shows no destructive bony lesions.

Post surgical changes are noted the distal gastric region.
Surgical clips are noted in the right upper abdomen.

The patient is status post cholecystectomy.

The study is limited without IV contrast.  The unenhanced liver,
spleen, pancreas and adrenal glands are unremarkable.  Unenhanced
kidneys are symmetrical in size.  No nephrolithiasis.  No
hydronephrosis or hydroureter.  No calcified ureteral calculi are
noted.

No aortic aneurysm.

Oral contrast material was given to the patient.

There is significant thickening of the cecal wall.  There is
thickening of the wall of the terminal ileum best seen in axial
images 69.  Mild stranding of the pericolonic fat in the right
lower quadrant of the abdomen.  The appendix is not identified.  A
lymph node in the right lower quadrant mesentery axial image 64
measures 8 mm probable reactive in nature.  The findings are highly
suspicious for Crohn's disease or  colitis.  No definite neoplastic
process is noted on this unenhanced scan.

No mesenteric abscess is noted.  Surgical clip is noted in the
right lower quadrant mesentery medially.

No small bowel or colonic obstruction.  Some stool noted in the
rectosigmoid colon and left colon.  The unenhanced uterus and
adnexa are unremarkable.
IMPRESSION: 1.  There is inflammatory process in the right lower quadrant of
the abdomen with significant thickening of the cecal wall.  There
is thickening of the wall of the terminal ileum.  Findings are
highly suspicious for Crohn's disease or colitis.  The appendix is
not identified.  No definite neoplastic process is identified on
this unenhanced scan.  Follow-up colonoscopy after treatment of
inflammatory changes is recommended as clinically warranted.
2.  Status post cholecystectomy.
3.  No small bowel or colonic obstruction.
4.  No hydronephrosis or hydroureter.

## 2012-07-12 ENCOUNTER — Emergency Department (HOSPITAL_COMMUNITY)
Admission: EM | Admit: 2012-07-12 | Discharge: 2012-07-12 | Disposition: A | Discharge status: Home / Self Care | Payer: Medicare Other | Attending: Emergency Medicine | Admitting: Emergency Medicine

## 2012-07-12 ENCOUNTER — Encounter (HOSPITAL_COMMUNITY): Payer: Self-pay

## 2012-07-12 DIAGNOSIS — E7800 Pure hypercholesterolemia, unspecified: Secondary | ICD-10-CM | POA: Insufficient documentation

## 2012-07-12 DIAGNOSIS — N390 Urinary tract infection, site not specified: Secondary | ICD-10-CM

## 2012-07-12 DIAGNOSIS — K219 Gastro-esophageal reflux disease without esophagitis: Secondary | ICD-10-CM | POA: Insufficient documentation

## 2012-07-12 DIAGNOSIS — R51 Headache: Secondary | ICD-10-CM | POA: Insufficient documentation

## 2012-07-12 DIAGNOSIS — F319 Bipolar disorder, unspecified: Secondary | ICD-10-CM | POA: Insufficient documentation

## 2012-07-12 DIAGNOSIS — K5090 Crohn's disease, unspecified, without complications: Secondary | ICD-10-CM | POA: Insufficient documentation

## 2012-07-12 DIAGNOSIS — R109 Unspecified abdominal pain: Secondary | ICD-10-CM | POA: Insufficient documentation

## 2012-07-12 DIAGNOSIS — I10 Essential (primary) hypertension: Secondary | ICD-10-CM | POA: Insufficient documentation

## 2012-07-12 DIAGNOSIS — G8929 Other chronic pain: Secondary | ICD-10-CM | POA: Insufficient documentation

## 2012-07-12 LAB — COMPREHENSIVE METABOLIC PANEL
ALT: 24 U/L (ref 0–35)
Albumin: 4 g/dL (ref 3.5–5.2)
Alkaline Phosphatase: 110 U/L (ref 39–117)
BUN: 14 mg/dL (ref 6–23)
Calcium: 9.9 mg/dL (ref 8.4–10.5)
GFR calc Af Amer: 83 mL/min — ABNORMAL LOW (ref >90)
Potassium: 3.9 mEq/L (ref 3.5–5.1)
Sodium: 138 mEq/L (ref 135–145)
Total Protein: 7.8 g/dL (ref 6.0–8.3)

## 2012-07-12 LAB — URINE MICROSCOPIC-ADD ON

## 2012-07-12 LAB — URINALYSIS, ROUTINE W REFLEX MICROSCOPIC
Bilirubin Urine: NEGATIVE
Ketones, ur: NEGATIVE mg/dL
Nitrite: NEGATIVE
Urobilinogen, UA: 0.2 mg/dL (ref 0.0–1.0)

## 2012-07-12 LAB — CBC WITH DIFFERENTIAL/PLATELET
Basophils Relative: 0 % (ref 0–1)
Eosinophils Absolute: 0.2 10*3/uL (ref 0.0–0.7)
Eosinophils Relative: 3 % (ref 0–5)
MCH: 26.7 pg (ref 26.0–34.0)
MCHC: 33.3 g/dL (ref 30.0–36.0)
MCV: 80.2 fL (ref 78.0–100.0)
Monocytes Relative: 8 % (ref 3–12)
Neutrophils Relative %: 56 % (ref 43–77)
Platelets: 285 10*3/uL (ref 150–400)

## 2012-07-12 MED ORDER — PROMETHAZINE HCL 25 MG PO TABS: 25.0000 mg | ORAL_TABLET | Freq: Four times a day (QID) | ORAL | Status: AC | PRN

## 2012-07-12 MED ORDER — ONDANSETRON HCL 4 MG/2ML IJ SOLN: 4.0000 mg | Freq: Once | INTRAMUSCULAR | Status: DC

## 2012-07-12 MED ORDER — HYDROCODONE-ACETAMINOPHEN 5-325 MG PO TABS: 1.0000 | ORAL_TABLET | Freq: Four times a day (QID) | ORAL | Status: AC | PRN

## 2012-07-12 MED ORDER — DIPHENHYDRAMINE HCL 50 MG/ML IJ SOLN
INTRAMUSCULAR | Status: AC
  Filled 2012-07-12: qty 1

## 2012-07-12 MED ORDER — PROMETHAZINE HCL 25 MG/ML IJ SOLN
25.0000 mg | Freq: Once | INTRAMUSCULAR | Status: AC
  Administered 2012-07-12: 25 mg via INTRAVENOUS
  Filled 2012-07-12: qty 1

## 2012-07-12 MED ORDER — HYDROMORPHONE HCL PF 1 MG/ML IJ SOLN
1.0000 mg | Freq: Once | INTRAMUSCULAR | Status: AC
  Administered 2012-07-12: 1 mg via INTRAVENOUS
  Filled 2012-07-12: qty 1

## 2012-07-12 MED ORDER — SODIUM CHLORIDE 0.9 % IV SOLN
Freq: Once | INTRAVENOUS | Status: AC
  Administered 2012-07-12: 08:00:00 via INTRAVENOUS

## 2012-07-12 MED ORDER — CEPHALEXIN 500 MG PO CAPS: 500.0000 mg | ORAL_CAPSULE | Freq: Four times a day (QID) | ORAL | Status: AC

## 2012-07-12 MED ORDER — DIPHENHYDRAMINE HCL 50 MG/ML IJ SOLN
12.5000 mg | Freq: Once | INTRAMUSCULAR | Status: AC
  Administered 2012-07-12: 12.5 mg via INTRAVENOUS

## 2012-07-12 NOTE — ED Provider Notes (Cosign Needed)
History   This chart was scribed for [name], MD by [name] . The patient was seen in room APA12/APA12. Patient's care was started at 0728.    CSN: [id number]  Arrival date & time 07/12/12  1191   First MD Initiated Contact with Patient 07/12/12 [phone]      Chief Complaint  Patient presents with  . Abdominal Pain  . Flank Pain  . Headache    (Consider location/radiation/quality/duration/timing/severity/associated sxs/prior treatment) HPI Comments: [name] is a 51 y.o. female who presents to the Emergency Department complaining of constant, moderate, gradual onset left flank pain for the past 2 days. Pt reports associated chills, nausea, dysuria, lower abdominal pain and a severe headache. Pt denies any fevers or hematuria. Pt reports a h/o migraine headaches. Pt states that she has tried drinking cranberry juice with no relief.    Patient is a 51 y.o. female presenting with flank pain. The history is provided by the patient.  Flank Pain This is a new problem. The current episode started 2 days ago. The problem occurs constantly. The problem has been gradually worsening. Associated symptoms include abdominal pain and headaches. Pertinent negatives include no chest pain. Nothing aggravates the symptoms. Nothing relieves the symptoms. Treatments tried: Cranberry juice. The treatment provided no relief.     Past Medical History  Diagnosis Date  . Hypertension   . Bipolar 1 disorder   . Depression   . Seizures   . Hypercholesteremia   . Acid reflux   . Chronic pain   . Stomach ulcer   . Headache   . Anxiety   . Crohn's disease     Past Surgical History  Procedure Date  . Cholecystectomy   . Tonsillectomy   . Stomach surgery   . Tubal ligation     Family History  Problem Relation Age of Onset  . Hypertension Father   . Diabetes type II Brother   . Hypertension Brother   . Diabetes type II Mother     History  Substance Use Topics  .  Smoking status: Never Smoker   . Smokeless tobacco: Not on file  . Alcohol Use: No    OB History    Grav Para Term Preterm Abortions TAB SAB Ect Mult Living                  Review of Systems  Constitutional: Positive for chills. Negative for fever and fatigue.  HENT: Negative for congestion, sinus pressure and ear discharge.   Eyes: Negative for discharge.  Respiratory: Negative for cough.   Cardiovascular: Negative for chest pain.  Gastrointestinal: Positive for nausea and abdominal pain. Negative for diarrhea.  Genitourinary: Positive for dysuria and flank pain. Negative for frequency and hematuria.  Musculoskeletal: Negative for back pain.  Skin: Negative for rash.  Neurological: Positive for headaches. Negative for seizures.  Hematological: Negative.   Psychiatric/Behavioral: Negative for hallucinations.  All other systems reviewed and are negative.   Allergies  Compazine; Penicillins; Aspirin; Cephalexin; Ketorolac tromethamine; Metoclopramide hcl; Ondansetron hcl; Sumatriptan; and Tetracycline  Home Medications   Current Outpatient Rx  Name Route Sig Dispense Refill  . ACETAMINOPHEN 500 MG PO TABS Oral Take 500-1,000 mg by mouth daily as needed. pain    . ALPRAZOLAM 1 MG PO TABS Oral Take 1 mg by mouth 4 (four) times daily.     . ARIPIPRAZOLE 10 MG PO TABS Oral Take 10 mg by mouth at bedtime.     Marland Kitchen  CLONIDINE HCL 0.1 MG PO TABS Oral Take 0.1 mg by mouth at bedtime.    . DULOXETINE HCL 60 MG PO CPEP Oral Take 60 mg by mouth every morning.     Marland Kitchen LABETALOL HCL 200 MG PO TABS Oral Take 200 mg by mouth 2 (two) times daily.    Marland Kitchen LEVETIRACETAM 500 MG PO TABS Oral Take 500-1,000 mg by mouth 2 (two) times daily. **Take one tablet daily in the morning and two tablets at bedtime**    . LUBIPROSTONE 8 MCG PO CAPS Oral Take 8 mcg by mouth 2 (two) times daily with a meal.     . ZOLPIDEM TARTRATE 10 MG PO TABS Oral Take 10 mg by mouth at bedtime as needed. For sleep       BP  112/83  Pulse 88  Temp 98.3 F (36.8 C)  Resp 18  Ht 5\' 5"  (1.651 m)  Wt 212 lb (96.163 kg)  BMI 35.28 kg/m2  SpO2 99%  Physical Exam  Constitutional: She is oriented to person, place, and time. She appears well-developed.  HENT:  Head: Normocephalic and atraumatic.  Eyes: Conjunctivae and EOM are normal. No scleral icterus.  Neck: Neck supple. No thyromegaly present.  Cardiovascular: Normal rate, regular rhythm and normal heart sounds.  Exam reveals no gallop and no friction rub.   No murmur heard. Pulmonary/Chest: Effort normal and breath sounds normal. No stridor. She has no wheezes. She has no rales. She exhibits no tenderness.  Abdominal: Soft. Bowel sounds are normal. She exhibits no distension. There is tenderness. There is no rebound.       Moderate left flank tenderness.   Musculoskeletal: Normal range of motion. She exhibits no edema.  Lymphadenopathy:    She has no cervical adenopathy.  Neurological: She is oriented to person, place, and time. Coordination normal.  Skin: No rash noted. No erythema.  Psychiatric: She has a normal mood and affect. Her behavior is normal.    ED Course  Procedures (including critical care time)  DIAGNOSTIC STUDIES: Oxygen Saturation is 99% on room air, normal by my interpretation.    COORDINATION OF CARE:   07:35-Discussed planned course of treatment with the patient, who is agreeable at this time.   07:45-Medication Orders: Promethazine (Phenergan) injection 25 mg-once; 0.9% sodium chloride infusion-once; Hydromorphone (Dilaudid) injection 1 mg-once.   09:20-Recheck: Informed pt of lab results. Will treat for possible UTI. Will start pt on abx, pain medication and nausea medication. Discussed f/u with a PCP after abx are finished. Pt is agreeable and requesting benadryl for itching.  09:30-Medication Orders: Diphenhydramine (Bendryl) injection 12.5 mg-once.   Results for orders placed during the hospital encounter of 07/12/12    URINALYSIS, ROUTINE W REFLEX MICROSCOPIC      Component Value Range   Color, Urine YELLOW  YELLOW   APPearance CLEAR  CLEAR   Specific Gravity, Urine 1.025  1.005 - 1.030   pH 5.5  5.0 - 8.0   Glucose, UA NEGATIVE  NEGATIVE mg/dL   Hgb urine dipstick NEGATIVE  NEGATIVE   Bilirubin Urine NEGATIVE  NEGATIVE   Ketones, ur NEGATIVE  NEGATIVE mg/dL   Protein, ur NEGATIVE  NEGATIVE mg/dL   Urobilinogen, UA 0.2  0.0 - 1.0 mg/dL   Nitrite NEGATIVE  NEGATIVE   Leukocytes, UA SMALL (*) NEGATIVE  CBC WITH DIFFERENTIAL      Component Value Range   WBC 8.8  4.0 - 10.5 K/uL   RBC 4.19  3.87 - 5.11 MIL/uL  Hemoglobin 11.2 (*) 12.0 - 15.0 g/dL   HCT 14.7 (*) 82.9 - 56.2 %   MCV 80.2  78.0 - 100.0 fL   MCH 26.7  26.0 - 34.0 pg   MCHC 33.3  30.0 - 36.0 g/dL   RDW 13.0  86.5 - 78.4 %   Platelets 285  150 - 400 K/uL   Neutrophils Relative 56  43 - 77 %   Neutro Abs 4.9  1.7 - 7.7 K/uL   Lymphocytes Relative 34  12 - 46 %   Lymphs Abs 3.0  0.7 - 4.0 K/uL   Monocytes Relative 8  3 - 12 %   Monocytes Absolute 0.7  0.1 - 1.0 K/uL   Eosinophils Relative 3  0 - 5 %   Eosinophils Absolute 0.2  0.0 - 0.7 K/uL   Basophils Relative 0  0 - 1 %   Basophils Absolute 0.0  0.0 - 0.1 K/uL  COMPREHENSIVE METABOLIC PANEL      Component Value Range   Sodium 138  135 - 145 mEq/L   Potassium 3.9  3.5 - 5.1 mEq/L   Chloride 101  96 - 112 mEq/L   CO2 26  19 - 32 mEq/L   Glucose, Bld 121 (*) 70 - 99 mg/dL   BUN 14  6 - 23 mg/dL   Creatinine, Ser 6.96  0.50 - 1.10 mg/dL   Calcium 9.9  8.4 - 29.5 mg/dL   Total Protein 7.8  6.0 - 8.3 g/dL   Albumin 4.0  3.5 - 5.2 g/dL   AST 26  0 - 37 U/L   ALT 24  0 - 35 U/L   Alkaline Phosphatase 110  39 - 117 U/L   Total Bilirubin 0.2 (*) 0.3 - 1.2 mg/dL   GFR calc non Af Amer 71 (*) >90 mL/min   GFR calc Af Amer 83 (*) >90 mL/min  URINE MICROSCOPIC-ADD ON      Component Value Range   Squamous Epithelial / LPF FEW (*) RARE   WBC, UA 3-6  <3 WBC/hpf   Bacteria, UA FEW  (*) RARE     No diagnosis found.    MDM  The chart was scribed for me under my direct supervision.  I personally performed the history, physical, and medical decision making and all procedures in the evaluation of this patient.[name], MD 07/12/12 [phone]

## 2012-07-12 NOTE — ED Notes (Signed)
Pt c/o headache, nausea, left flank pain, and lower abd pain x 2 days.

## 2012-07-13 LAB — URINE CULTURE

## 2012-07-25 DIAGNOSIS — R51 Headache: Secondary | ICD-10-CM

## 2012-07-29 ENCOUNTER — Encounter (HOSPITAL_COMMUNITY): Payer: Self-pay | Admitting: Emergency Medicine

## 2012-07-29 ENCOUNTER — Emergency Department (HOSPITAL_COMMUNITY)
Admission: EM | Admit: 2012-07-29 | Discharge: 2012-07-29 | Disposition: A | Discharge status: Home / Self Care | Payer: Medicare Other | Attending: Emergency Medicine | Admitting: Emergency Medicine

## 2012-07-29 DIAGNOSIS — G43909 Migraine, unspecified, not intractable, without status migrainosus: Secondary | ICD-10-CM | POA: Insufficient documentation

## 2012-07-29 DIAGNOSIS — E7800 Pure hypercholesterolemia, unspecified: Secondary | ICD-10-CM | POA: Insufficient documentation

## 2012-07-29 DIAGNOSIS — Z79899 Other long term (current) drug therapy: Secondary | ICD-10-CM | POA: Insufficient documentation

## 2012-07-29 DIAGNOSIS — Z9089 Acquired absence of other organs: Secondary | ICD-10-CM | POA: Insufficient documentation

## 2012-07-29 DIAGNOSIS — K5090 Crohn's disease, unspecified, without complications: Secondary | ICD-10-CM | POA: Insufficient documentation

## 2012-07-29 DIAGNOSIS — R109 Unspecified abdominal pain: Secondary | ICD-10-CM | POA: Insufficient documentation

## 2012-07-29 DIAGNOSIS — I10 Essential (primary) hypertension: Secondary | ICD-10-CM | POA: Insufficient documentation

## 2012-07-29 DIAGNOSIS — G8929 Other chronic pain: Secondary | ICD-10-CM | POA: Insufficient documentation

## 2012-07-29 DIAGNOSIS — F319 Bipolar disorder, unspecified: Secondary | ICD-10-CM | POA: Insufficient documentation

## 2012-07-29 LAB — BASIC METABOLIC PANEL
BUN: 18 mg/dL (ref 6–23)
Calcium: 9.3 mg/dL (ref 8.4–10.5)
Creatinine, Ser: 1.21 mg/dL — ABNORMAL HIGH (ref 0.50–1.10)
GFR calc Af Amer: 59 mL/min — ABNORMAL LOW (ref >90)
GFR calc non Af Amer: 51 mL/min — ABNORMAL LOW (ref >90)
Glucose, Bld: 108 mg/dL — ABNORMAL HIGH (ref 70–99)

## 2012-07-29 LAB — URINALYSIS, ROUTINE W REFLEX MICROSCOPIC
Bilirubin Urine: NEGATIVE
Hgb urine dipstick: NEGATIVE
Ketones, ur: NEGATIVE mg/dL
Nitrite: NEGATIVE
Specific Gravity, Urine: 1.01 (ref 1.005–1.030)
Urobilinogen, UA: 0.2 mg/dL (ref 0.0–1.0)

## 2012-07-29 LAB — CBC WITH DIFFERENTIAL/PLATELET
Basophils Relative: 0 % (ref 0–1)
Eosinophils Absolute: 0.2 10*3/uL (ref 0.0–0.7)
Eosinophils Relative: 2 % (ref 0–5)
Hemoglobin: 11.1 g/dL — ABNORMAL LOW (ref 12.0–15.0)
Lymphs Abs: 2.1 10*3/uL (ref 0.7–4.0)
MCH: 26.6 pg (ref 26.0–34.0)
MCHC: 32.7 g/dL (ref 30.0–36.0)
MCV: 81.3 fL (ref 78.0–100.0)
Monocytes Absolute: 0.7 10*3/uL (ref 0.1–1.0)
Monocytes Relative: 8 % (ref 3–12)
Neutrophils Relative %: 67 % (ref 43–77)
RBC: 4.17 MIL/uL (ref 3.87–5.11)

## 2012-07-29 LAB — URINE MICROSCOPIC-ADD ON

## 2012-07-29 MED ORDER — PROMETHAZINE HCL 25 MG/ML IJ SOLN
25.0000 mg | Freq: Once | INTRAMUSCULAR | Status: AC
  Administered 2012-07-29: 25 mg via INTRAVENOUS
  Filled 2012-07-29: qty 1

## 2012-07-29 MED ORDER — SODIUM CHLORIDE 0.9 % IV BOLUS (SEPSIS)
1000.0000 mL | Freq: Once | INTRAVENOUS | Status: AC
  Administered 2012-07-29: 1000 mL via INTRAVENOUS

## 2012-07-29 MED ORDER — HYDROMORPHONE HCL PF 1 MG/ML IJ SOLN
1.0000 mg | Freq: Once | INTRAMUSCULAR | Status: AC
  Administered 2012-07-29: 1 mg via INTRAVENOUS
  Filled 2012-07-29: qty 1

## 2012-07-29 NOTE — ED Notes (Signed)
Patient is comfortable at this time. 

## 2012-07-29 NOTE — ED Notes (Addendum)
Pt going to bathroom. Requesting more pain med. PA aware

## 2012-07-29 NOTE — ED Notes (Signed)
Constant migraine x 3 days. Seen [facility] 3 days ago and given shots which pt states does not help. C/o dizziness with movement and blurred vision. States these sx's normal with her migraines. Alert/oriented. Nausea. No vomiting. Mm wet.

## 2012-07-29 NOTE — ED Provider Notes (Signed)
History     CSN: [id number]  Arrival date & time 07/29/12  0934   First MD Initiated Contact with Patient 07/29/12 1013      Chief Complaint  Patient presents with  . Migraine    (Consider location/radiation/quality/duration/timing/severity/associated sxs/prior treatment) HPI Comments: Patient with hx of chronic migraine headaches, c/o increasing headache for 3 days.  States she was seen at another ED at time on onset and treated with medication which provided some relief, but headache returned several hours later.  Describes the headache as throbbing, tightness through her temples and across her forehead.  States pain is similar to previous headaches.  Also c/o pain to her left flank and malodorous urine.  States she was treated here 2 weeks ago for same but sx's never completely resolved.  She denies chest pain, shortness of breath, neck pain or stiffness, visual change, vomiting or fever.  Patient is a 51 y.o. female presenting with migraine. The history is provided by the patient.  Migraine This is a recurrent problem. The current episode started in the past 7 days. The problem occurs constantly. The problem has been gradually worsening. Associated symptoms include headaches, nausea and urinary symptoms. Pertinent negatives include no abdominal pain, arthralgias, chest pain, coughing, fever, joint swelling, myalgias, neck pain, numbness, rash, sore throat, vertigo, visual change, vomiting or weakness. The symptoms are aggravated by stress (bright light). She has tried NSAIDs, rest and sleep for the symptoms. The treatment provided no relief.    Past Medical History  Diagnosis Date  . Hypertension   . Bipolar 1 disorder   . Depression   . Seizures   . Hypercholesteremia   . Acid reflux   . Chronic pain   . Stomach ulcer   . Headache   . Anxiety   . Crohn's disease     Past Surgical History  Procedure Date  . Cholecystectomy   . Tonsillectomy   . Stomach surgery   . Tubal  ligation     Family History  Problem Relation Age of Onset  . Hypertension Father   . Diabetes type II Brother   . Hypertension Brother   . Diabetes type II Mother     History  Substance Use Topics  . Smoking status: Never Smoker   . Smokeless tobacco: Not on file  . Alcohol Use: No    OB History    Grav Para Term Preterm Abortions TAB SAB Ect Mult Living                  Review of Systems  Constitutional: Negative for fever, activity change and appetite change.  HENT: Negative for sore throat, facial swelling, trouble swallowing, neck pain and neck stiffness.   Eyes: Positive for photophobia. Negative for pain and visual disturbance.  Respiratory: Negative for cough and shortness of breath.   Cardiovascular: Negative for chest pain.  Gastrointestinal: Positive for nausea. Negative for vomiting and abdominal pain.  Genitourinary: Positive for dysuria, flank pain and decreased urine volume. Negative for frequency, vaginal bleeding, vaginal discharge, difficulty urinating and vaginal pain.  Musculoskeletal: Negative for myalgias, joint swelling and arthralgias.  Skin: Negative for rash and wound.  Neurological: Positive for headaches. Negative for dizziness, vertigo, syncope, facial asymmetry, speech difficulty, weakness and numbness.  Psychiatric/Behavioral: Negative for confusion and decreased concentration.  All other systems reviewed and are negative.    Allergies  Compazine; Penicillins; Aspirin; Cephalexin; Ketorolac tromethamine; Metoclopramide hcl; Ondansetron hcl; Sumatriptan; and Tetracycline  Home Medications   Current  Outpatient Rx  Name Route Sig Dispense Refill  . ACETAMINOPHEN 500 MG PO TABS Oral Take 500-1,000 mg by mouth daily as needed. pain    . ALPRAZOLAM 1 MG PO TABS Oral Take 1 mg by mouth 4 (four) times daily.     . ARIPIPRAZOLE 10 MG PO TABS Oral Take 10 mg by mouth at bedtime.     Marland Kitchen CLONIDINE HCL 0.1 MG PO TABS Oral Take 0.1 mg by mouth at  bedtime.    . DULOXETINE HCL 60 MG PO CPEP Oral Take 60 mg by mouth every morning.     Marland Kitchen LABETALOL HCL 200 MG PO TABS Oral Take 200 mg by mouth 2 (two) times daily.    Marland Kitchen LEVETIRACETAM 500 MG PO TABS Oral Take 500-1,000 mg by mouth 2 (two) times daily. **Take one tablet daily in the morning and two tablets at bedtime**    . LUBIPROSTONE 8 MCG PO CAPS Oral Take 8 mcg by mouth 2 (two) times daily with a meal.     . ZOLPIDEM TARTRATE 10 MG PO TABS Oral Take 10 mg by mouth at bedtime as needed. For sleep       BP 133/98  Pulse 99  Temp 97.3 F (36.3 C) (Oral)  Resp 18  SpO2 98%  Physical Exam  Nursing note and vitals reviewed. Constitutional: She is oriented to person, place, and time. She appears well-developed and well-nourished. No distress.  HENT:  Head: Normocephalic and atraumatic.  Mouth/Throat: Oropharynx is clear and moist.  Eyes: EOM are normal. Pupils are equal, round, and reactive to light.  Neck: Normal range of motion and phonation normal. Neck supple. No rigidity. No Brudzinski's sign and no Kernig's sign noted.  Cardiovascular: Normal rate, regular rhythm, normal heart sounds and intact distal pulses.   No murmur heard. Pulmonary/Chest: Effort normal and breath sounds normal.  Abdominal: Soft. Bowel sounds are normal. She exhibits no distension and no mass. There is no hepatosplenomegaly. There is no rigidity, no rebound, no guarding and no tenderness at McBurney's point.       Mild ttp of the left flank area.  Remaining abdominal exam is unremarkable  Musculoskeletal: Normal range of motion.  Neurological: She is alert and oriented to person, place, and time. No cranial nerve deficit or sensory deficit. She exhibits normal muscle tone. Coordination and gait normal.  Reflex Scores:      Tricep reflexes are 2+ on the right side and 2+ on the left side.      Bicep reflexes are 2+ on the right side and 2+ on the left side. Skin: Skin is warm and dry.  Psychiatric: She has a  normal mood and affect.    ED Course  Procedures (including critical care time)  Results for orders placed during the hospital encounter of 07/29/12  CBC WITH DIFFERENTIAL      Component Value Range   WBC 9.3  4.0 - 10.5 K/uL   RBC 4.17  3.87 - 5.11 MIL/uL   Hemoglobin 11.1 (*) 12.0 - 15.0 g/dL   HCT 16.1 (*) 09.6 - 04.5 %   MCV 81.3  78.0 - 100.0 fL   MCH 26.6  26.0 - 34.0 pg   MCHC 32.7  30.0 - 36.0 g/dL   RDW 40.9  81.1 - 91.4 %   Platelets 217  150 - 400 K/uL   Neutrophils Relative 67  43 - 77 %   Neutro Abs 6.2  1.7 - 7.7 K/uL   Lymphocytes Relative  22  12 - 46 %   Lymphs Abs 2.1  0.7 - 4.0 K/uL   Monocytes Relative 8  3 - 12 %   Monocytes Absolute 0.7  0.1 - 1.0 K/uL   Eosinophils Relative 2  0 - 5 %   Eosinophils Absolute 0.2  0.0 - 0.7 K/uL   Basophils Relative 0  0 - 1 %   Basophils Absolute 0.0  0.0 - 0.1 K/uL  BASIC METABOLIC PANEL      Component Value Range   Sodium 137  135 - 145 mEq/L   Potassium 3.6  3.5 - 5.1 mEq/L   Chloride 105  96 - 112 mEq/L   CO2 22  19 - 32 mEq/L   Glucose, Bld 108 (*) 70 - 99 mg/dL   BUN 18  6 - 23 mg/dL   Creatinine, Ser 4.09 (*) 0.50 - 1.10 mg/dL   Calcium 9.3  8.4 - 81.1 mg/dL   GFR calc non Af Amer 51 (*) >90 mL/min   GFR calc Af Amer 59 (*) >90 mL/min  URINALYSIS, ROUTINE W REFLEX MICROSCOPIC      Component Value Range   Color, Urine YELLOW  YELLOW   APPearance CLEAR  CLEAR   Specific Gravity, Urine 1.010  1.005 - 1.030   pH 6.0  5.0 - 8.0   Glucose, UA NEGATIVE  NEGATIVE mg/dL   Hgb urine dipstick NEGATIVE  NEGATIVE   Bilirubin Urine NEGATIVE  NEGATIVE   Ketones, ur NEGATIVE  NEGATIVE mg/dL   Protein, ur NEGATIVE  NEGATIVE mg/dL   Urobilinogen, UA 0.2  0.0 - 1.0 mg/dL   Nitrite NEGATIVE  NEGATIVE   Leukocytes, UA TRACE (*) NEGATIVE  URINE MICROSCOPIC-ADD ON      Component Value Range   Squamous Epithelial / LPF RARE  RARE   WBC, UA 0-2  <3 WBC/hpf         MDM     Previous ED charts were reviewed.   Patient seen here on 07/12/12 for left flank pain as well.  Treated with keflex for UTI, urine culture was negative. No hematuria, fever, vomiting to suggest kidney stone or pyelonephritis. No h/o kidney stones.  Multiple ED visits.     Vitals stable,  Pt is non-toxic appearing.  No focal neuro deficits, no meningeal signs.  Headache of gradual onset that is similar to previous.  Patient agrees to close f/u with PMD or to return here if the symptoms worsen.    The patient appears reasonably screened and/or stabilized for discharge and I doubt any other medical condition or other [facility] requiring further screening, evaluation, or treatment in the ED at this time prior to discharge.      Praneeth Bussey L. Destanee Bedonie, PA 07/29/12 1223  Lillien Petronio L. Rut [place], [place] 07/29/12 1225

## 2012-08-01 NOTE — ED Provider Notes (Signed)
Medical screening examination/treatment/procedure(s) were performed by non-physician practitioner and as supervising physician I was immediately available for consultation/collaboration.   Shelda Jakes, MD 08/01/12 [phone]

## 2012-10-04 ENCOUNTER — Emergency Department (HOSPITAL_COMMUNITY): Payer: Medicare Other

## 2012-10-04 ENCOUNTER — Encounter (HOSPITAL_COMMUNITY): Payer: Self-pay | Admitting: Emergency Medicine

## 2012-10-04 ENCOUNTER — Emergency Department (HOSPITAL_COMMUNITY)
Admission: EM | Admit: 2012-10-04 | Discharge: 2012-10-04 | Disposition: A | Discharge status: Home / Self Care | Payer: Medicare Other | Attending: Emergency Medicine | Admitting: Emergency Medicine

## 2012-10-04 DIAGNOSIS — E7800 Pure hypercholesterolemia, unspecified: Secondary | ICD-10-CM | POA: Insufficient documentation

## 2012-10-04 DIAGNOSIS — R109 Unspecified abdominal pain: Secondary | ICD-10-CM

## 2012-10-04 DIAGNOSIS — Z8669 Personal history of other diseases of the nervous system and sense organs: Secondary | ICD-10-CM | POA: Insufficient documentation

## 2012-10-04 DIAGNOSIS — K5090 Crohn's disease, unspecified, without complications: Secondary | ICD-10-CM | POA: Insufficient documentation

## 2012-10-04 DIAGNOSIS — Z79899 Other long term (current) drug therapy: Secondary | ICD-10-CM | POA: Insufficient documentation

## 2012-10-04 DIAGNOSIS — Z8659 Personal history of other mental and behavioral disorders: Secondary | ICD-10-CM | POA: Insufficient documentation

## 2012-10-04 DIAGNOSIS — R112 Nausea with vomiting, unspecified: Secondary | ICD-10-CM | POA: Insufficient documentation

## 2012-10-04 DIAGNOSIS — Z8719 Personal history of other diseases of the digestive system: Secondary | ICD-10-CM | POA: Insufficient documentation

## 2012-10-04 DIAGNOSIS — I10 Essential (primary) hypertension: Secondary | ICD-10-CM | POA: Insufficient documentation

## 2012-10-04 DIAGNOSIS — Z8711 Personal history of peptic ulcer disease: Secondary | ICD-10-CM | POA: Insufficient documentation

## 2012-10-04 DIAGNOSIS — F319 Bipolar disorder, unspecified: Secondary | ICD-10-CM | POA: Insufficient documentation

## 2012-10-04 LAB — CBC WITH DIFFERENTIAL/PLATELET
Basophils Relative: 0 % (ref 0–1)
Eosinophils Relative: 3 % (ref 0–5)
HCT: 36.4 % (ref 36.0–46.0)
Hemoglobin: 12.1 g/dL (ref 12.0–15.0)
Lymphocytes Relative: 34 % (ref 12–46)
MCHC: 33.2 g/dL (ref 30.0–36.0)
MCV: 79.3 fL (ref 78.0–100.0)
Monocytes Absolute: 0.6 10*3/uL (ref 0.1–1.0)
Monocytes Relative: 7 % (ref 3–12)
Neutro Abs: 5 10*3/uL (ref 1.7–7.7)
RDW: 14.6 % (ref 11.5–15.5)

## 2012-10-04 LAB — COMPREHENSIVE METABOLIC PANEL
BUN: 9 mg/dL (ref 6–23)
CO2: 26 mEq/L (ref 19–32)
Calcium: 9.7 mg/dL (ref 8.4–10.5)
Chloride: 102 mEq/L (ref 96–112)
Creatinine, Ser: 0.67 mg/dL (ref 0.50–1.10)
GFR calc non Af Amer: >90 mL/min (ref >90)
Total Bilirubin: 0.2 mg/dL — ABNORMAL LOW (ref 0.3–1.2)

## 2012-10-04 LAB — URINALYSIS, ROUTINE W REFLEX MICROSCOPIC
Ketones, ur: NEGATIVE mg/dL
Leukocytes, UA: NEGATIVE
Nitrite: NEGATIVE
Protein, ur: NEGATIVE mg/dL
Urobilinogen, UA: 0.2 mg/dL (ref 0.0–1.0)

## 2012-10-04 LAB — LIPASE, BLOOD: Lipase: 38 U/L (ref 11–59)

## 2012-10-04 MED ORDER — SODIUM CHLORIDE 0.9 % IV SOLN
Freq: Once | INTRAVENOUS | Status: AC
  Administered 2012-10-04: 09:00:00 via INTRAVENOUS

## 2012-10-04 MED ORDER — PROMETHAZINE HCL 25 MG/ML IJ SOLN
12.5000 mg | Freq: Once | INTRAMUSCULAR | Status: AC
  Administered 2012-10-04: 12.5 mg via INTRAVENOUS
  Filled 2012-10-04: qty 1

## 2012-10-04 MED ORDER — HYDROCODONE-ACETAMINOPHEN 5-325 MG PO TABS: 1.0000 | ORAL_TABLET | Freq: Four times a day (QID) | ORAL | Status: DC | PRN

## 2012-10-04 MED ORDER — PROMETHAZINE HCL 25 MG PO TABS: 25.0000 mg | ORAL_TABLET | Freq: Four times a day (QID) | ORAL | Status: DC | PRN

## 2012-10-04 MED ORDER — HYDROMORPHONE HCL PF 1 MG/ML IJ SOLN
1.0000 mg | Freq: Once | INTRAMUSCULAR | Status: AC
  Administered 2012-10-04: 1 mg via INTRAVENOUS
  Filled 2012-10-04: qty 1

## 2012-10-04 MED ORDER — ONDANSETRON HCL 4 MG/2ML IJ SOLN
4.0000 mg | Freq: Once | INTRAMUSCULAR | Status: DC
  Filled 2012-10-04: qty 2

## 2012-10-04 MED ORDER — IOHEXOL 300 MG/ML  SOLN
100.0000 mL | Freq: Once | INTRAMUSCULAR | Status: AC | PRN
  Administered 2012-10-04: 100 mL via INTRAVENOUS

## 2012-10-04 NOTE — ED Notes (Signed)
Ask pt if she could give [place] a urinalysis , said she couldn't right now

## 2012-10-04 NOTE — ED Notes (Signed)
Pt received d/c instructions and verbalized understanding, voiced no questions

## 2012-10-04 NOTE — ED Notes (Signed)
Nausea and vomiting with abd pain for three days. Pt states similar pain to what she has had before.

## 2012-10-04 NOTE — ED Provider Notes (Signed)
History  This chart was scribed for Danielle Lennert, MD by Ladona Ridgel Day. This patient was seen in room APA07/APA07 and the patient's care was started at 0807.   CSN: [id number]  Arrival date & time 10/04/12  0807   First MD Initiated Contact with Patient 10/04/12 0813      Chief Complaint  Patient presents with  . Abdominal Pain   Patient is a 51 y.o. female presenting with abdominal pain. The history is provided by the patient. No language interpreter was used.  Abdominal Pain The primary symptoms of the illness include abdominal pain, nausea and vomiting. The primary symptoms of the illness do not include fever, shortness of breath or diarrhea. The current episode started more than 2 days ago. The onset of the illness was gradual. The problem has been gradually worsening.  The patient states that she believes she is currently not pregnant. The patient has not had a change in bowel habit. Risk factors for an acute abdominal problem include a history of abdominal surgery. Symptoms associated with the illness do not include chills or constipation.   [name] is a 51 y.o. female who presents to the Emergency Department complaining of constant gradually worsening diffuse abdominal pain for the past 3 days. She states her pain is similar to previous episode where she had diverticulosis. She states associated nausea, and emesis without any relief from Prilosec. She denies any fever or chills. Past abdominal surgeries include cholecystectomy, appendectomy, and partial hysterectomy.    Past Medical History  Diagnosis Date  . Hypertension   . Bipolar 1 disorder   . Depression   . Seizures   . Hypercholesteremia   . Acid reflux   . Chronic pain   . Stomach ulcer   . Headache   . Anxiety   . Crohn's disease     Past Surgical History  Procedure Date  . Cholecystectomy   . Tonsillectomy   . Stomach surgery   . Tubal ligation     Family History  Problem Relation Age of Onset  .  Hypertension Father   . Diabetes type II Brother   . Hypertension Brother   . Diabetes type II Mother     History  Substance Use Topics  . Smoking status: Never Smoker   . Smokeless tobacco: Not on file  . Alcohol Use: No    OB History    Grav Para Term Preterm Abortions TAB SAB Ect Mult Living                  Review of Systems  Constitutional: Negative for fever and chills.  HENT: Negative for congestion.   Respiratory: Negative for shortness of breath.   Cardiovascular: Negative for chest pain.  Gastrointestinal: Positive for nausea, vomiting and abdominal pain. Negative for diarrhea and constipation.  Skin: Negative for color change.  Neurological: Negative for weakness.  All other systems reviewed and are negative.    Allergies  Compazine; Penicillins; Aspirin; Cephalexin; Ketorolac tromethamine; Metoclopramide hcl; Ondansetron hcl; Sumatriptan; and Tetracycline  Home Medications   Current Outpatient Rx  Name  Route  Sig  Dispense  Refill  . ACETAMINOPHEN 500 MG PO TABS   Oral   Take 500-1,000 mg by mouth daily as needed. pain         . ALPRAZOLAM 1 MG PO TABS   Oral   Take 1 mg by mouth 4 (four) times daily.          . ARIPIPRAZOLE 10  MG PO TABS   Oral   Take 10 mg by mouth at bedtime.          Marland Kitchen CLONIDINE HCL 0.1 MG PO TABS   Oral   Take 0.1 mg by mouth at bedtime.         . DULOXETINE HCL 60 MG PO CPEP   Oral   Take 60 mg by mouth every morning.          Marland Kitchen LABETALOL HCL 200 MG PO TABS   Oral   Take 200 mg by mouth 2 (two) times daily.         Marland Kitchen LEVETIRACETAM 500 MG PO TABS   Oral   Take 500-1,000 mg by mouth 2 (two) times daily. **Take one tablet daily in the morning and two tablets at bedtime**         . LUBIPROSTONE 8 MCG PO CAPS   Oral   Take 8 mcg by mouth 2 (two) times daily with a meal.          . ZOLPIDEM TARTRATE 10 MG PO TABS   Oral   Take 10 mg by mouth at bedtime as needed. For sleep            Triage  vitals: BP 168/120  Temp 98 F (36.7 C) (Oral)  Resp 22  Ht 5' 4.5" (1.638 m)  Wt 198 lb (89.812 kg)  BMI 33.46 kg/m2  SpO2 97%  Physical Exam  Nursing note and vitals reviewed. Constitutional: She is oriented to person, place, and time. She appears well-developed and well-nourished. No distress.  HENT:  Head: Normocephalic and atraumatic.  Eyes: Conjunctivae normal and EOM are normal.  Neck: Neck supple. No tracheal deviation present.  Cardiovascular: Normal rate.   No murmur heard. Pulmonary/Chest: Effort normal. No respiratory distress.  Abdominal: Soft. Bowel sounds are normal. She exhibits no distension. There is tenderness (Suprpubic and LLQ tenderness). There is no rebound and no guarding.  Musculoskeletal: Normal range of motion.  Neurological: She is alert and oriented to person, place, and time.  Skin: Skin is warm and dry.  Psychiatric: She has a normal mood and affect. Her behavior is normal.    ED Course  Procedures (including critical care time) DIAGNOSTIC STUDIES: Oxygen Saturation is 97% on room air, normal by my interpretation.    COORDINATION OF CARE: At 825 AM Discussed treatment plan with patient which includes pain/nuasea medicine, IV fluids, UA, blood work. Patient agrees.    Labs Reviewed  CBC WITH DIFFERENTIAL  COMPREHENSIVE METABOLIC PANEL  LIPASE, BLOOD  URINALYSIS, ROUTINE W REFLEX MICROSCOPIC   No results found.   No diagnosis found.    MDM  The chart was scribed for me under my direct supervision.  I personally performed the history, physical, and medical decision making and all procedures in the evaluation of this patient.Danielle Lennert, MD 10/04/12 [phone]

## 2012-10-12 ENCOUNTER — Emergency Department (HOSPITAL_COMMUNITY)
Admission: EM | Admit: 2012-10-12 | Discharge: 2012-10-12 | Disposition: A | Discharge status: Home / Self Care | Payer: Medicare Other | Attending: Emergency Medicine | Admitting: Emergency Medicine

## 2012-10-12 ENCOUNTER — Encounter (HOSPITAL_COMMUNITY): Payer: Self-pay | Admitting: *Deleted

## 2012-10-12 DIAGNOSIS — G40909 Epilepsy, unspecified, not intractable, without status epilepticus: Secondary | ICD-10-CM | POA: Insufficient documentation

## 2012-10-12 DIAGNOSIS — Z8719 Personal history of other diseases of the digestive system: Secondary | ICD-10-CM | POA: Insufficient documentation

## 2012-10-12 DIAGNOSIS — Z79899 Other long term (current) drug therapy: Secondary | ICD-10-CM | POA: Insufficient documentation

## 2012-10-12 DIAGNOSIS — G8929 Other chronic pain: Secondary | ICD-10-CM | POA: Insufficient documentation

## 2012-10-12 DIAGNOSIS — K219 Gastro-esophageal reflux disease without esophagitis: Secondary | ICD-10-CM | POA: Insufficient documentation

## 2012-10-12 DIAGNOSIS — F3289 Other specified depressive episodes: Secondary | ICD-10-CM | POA: Insufficient documentation

## 2012-10-12 DIAGNOSIS — F319 Bipolar disorder, unspecified: Secondary | ICD-10-CM | POA: Insufficient documentation

## 2012-10-12 DIAGNOSIS — F411 Generalized anxiety disorder: Secondary | ICD-10-CM | POA: Insufficient documentation

## 2012-10-12 DIAGNOSIS — K5090 Crohn's disease, unspecified, without complications: Secondary | ICD-10-CM | POA: Insufficient documentation

## 2012-10-12 DIAGNOSIS — R112 Nausea with vomiting, unspecified: Secondary | ICD-10-CM | POA: Insufficient documentation

## 2012-10-12 DIAGNOSIS — R197 Diarrhea, unspecified: Secondary | ICD-10-CM

## 2012-10-12 DIAGNOSIS — I10 Essential (primary) hypertension: Secondary | ICD-10-CM | POA: Insufficient documentation

## 2012-10-12 DIAGNOSIS — F329 Major depressive disorder, single episode, unspecified: Secondary | ICD-10-CM | POA: Insufficient documentation

## 2012-10-12 DIAGNOSIS — E7800 Pure hypercholesterolemia, unspecified: Secondary | ICD-10-CM | POA: Insufficient documentation

## 2012-10-12 LAB — COMPREHENSIVE METABOLIC PANEL
ALT: 54 U/L — ABNORMAL HIGH (ref 0–35)
AST: 48 U/L — ABNORMAL HIGH (ref 0–37)
CO2: 25 mEq/L (ref 19–32)
Chloride: 104 mEq/L (ref 96–112)
GFR calc non Af Amer: >90 mL/min (ref >90)
Potassium: 3.7 mEq/L (ref 3.5–5.1)
Sodium: 138 mEq/L (ref 135–145)
Total Bilirubin: 0.1 mg/dL — ABNORMAL LOW (ref 0.3–1.2)

## 2012-10-12 LAB — CBC
MCHC: 32.8 g/dL (ref 30.0–36.0)
Platelets: 240 10*3/uL (ref 150–400)
RDW: 14.5 % (ref 11.5–15.5)

## 2012-10-12 MED ORDER — LORAZEPAM 2 MG/ML IJ SOLN
0.5000 mg | Freq: Once | INTRAMUSCULAR | Status: AC
  Administered 2012-10-12: 0.5 mg via INTRAVENOUS
  Filled 2012-10-12: qty 1

## 2012-10-12 MED ORDER — HYDROMORPHONE HCL PF 1 MG/ML IJ SOLN
1.0000 mg | Freq: Once | INTRAMUSCULAR | Status: AC
  Administered 2012-10-12: 1 mg via INTRAVENOUS
  Filled 2012-10-12: qty 1

## 2012-10-12 MED ORDER — HYDROCODONE-ACETAMINOPHEN 5-325 MG PO TABS: 1.0000 | ORAL_TABLET | ORAL | Status: DC | PRN

## 2012-10-12 MED ORDER — SODIUM CHLORIDE 0.9 % IV SOLN
1000.0000 mL | Freq: Once | INTRAVENOUS | Status: AC
  Administered 2012-10-12: 1000 mL via INTRAVENOUS

## 2012-10-12 MED ORDER — DIPHENHYDRAMINE HCL 50 MG/ML IJ SOLN
12.5000 mg | Freq: Once | INTRAMUSCULAR | Status: AC
  Administered 2012-10-12: 12.5 mg via INTRAVENOUS
  Filled 2012-10-12: qty 1

## 2012-10-12 MED ORDER — PROMETHAZINE HCL 25 MG PO TABS: 25.0000 mg | ORAL_TABLET | Freq: Four times a day (QID) | ORAL | Status: DC | PRN

## 2012-10-12 MED ORDER — ONDANSETRON HCL 4 MG/2ML IJ SOLN: 4.0000 mg | Freq: Once | INTRAMUSCULAR | Status: DC

## 2012-10-12 MED ORDER — PROMETHAZINE HCL 25 MG/ML IJ SOLN
25.0000 mg | Freq: Once | INTRAMUSCULAR | Status: AC
  Administered 2012-10-12: 25 mg via INTRAVENOUS
  Filled 2012-10-12: qty 1

## 2012-10-12 MED ORDER — SODIUM CHLORIDE 0.9 % IV SOLN
1000.0000 mL | INTRAVENOUS | Status: DC
  Administered 2012-10-12: 1000 mL via INTRAVENOUS

## 2012-10-12 NOTE — ED Provider Notes (Signed)
History   This chart was scribed for Danielle Co, MD by [name], ED Scribe. The patient was seen in room APA15/APA15 and the patient's care was started at 6:42PM.     CSN: [id number]  Arrival date & time 10/12/12  8295   First MD Initiated Contact with Patient 10/12/12 1842      Chief Complaint  Patient presents with  . Emesis     The history is provided by the patient.    [name] is a 51 y.o. female , with a hx of partial gastrectomy and bleeding ulcers, who presents to the Emergency Department complaining of sudden, progressively worsening, emesis, onset yesterday (10/11/12).  Associated symptoms include nausea. The pt reports she began to vomit yesterday, however, this evening after eating dinner her symptoms became worse, prompting her visit to [facility]. The pt has a hx of hypertension, chron's disease, and cholecystectomy.    The pt denies any sick contacts.    The pt does not smoke or drink alcohol.   PCP is Dr. Aleen Campi.    Past Medical History  Diagnosis Date  . Hypertension   . Bipolar 1 disorder   . Depression   . Hypercholesteremia   . Acid reflux   . Chronic pain   . Stomach ulcer   . Headache   . Anxiety   . Crohn's disease   . Seizures     Past Surgical History  Procedure Date  . Cholecystectomy   . Tonsillectomy   . Stomach surgery   . Tubal ligation     Family History  Problem Relation Age of Onset  . Hypertension Father   . Diabetes type II Brother   . Hypertension Brother   . Diabetes type II Mother     History  Substance Use Topics  . Smoking status: Never Smoker   . Smokeless tobacco: Not on file  . Alcohol Use: No    OB History    Grav Para Term Preterm Abortions TAB SAB Ect Mult Living                  Review of Systems  All other systems reviewed and are negative.    Allergies  Compazine; Penicillins; Aspirin; Cephalexin; Ketorolac tromethamine; Metoclopramide hcl; Ondansetron hcl; Sumatriptan; and  Tetracycline  Home Medications   Current Outpatient Rx  Name  Route  Sig  Dispense  Refill  . ACETAMINOPHEN 500 MG PO TABS   Oral   Take 500-1,000 mg by mouth daily as needed. pain         . ALPRAZOLAM 1 MG PO TABS   Oral   Take 1 mg by mouth 4 (four) times daily.          . ARIPIPRAZOLE 10 MG PO TABS   Oral   Take 10 mg by mouth at bedtime.          Marland Kitchen CLONIDINE HCL 0.1 MG PO TABS   Oral   Take 0.1 mg by mouth at bedtime.         . DULOXETINE HCL 60 MG PO CPEP   Oral   Take 60 mg by mouth every morning.          Marland Kitchen HYDROCODONE-ACETAMINOPHEN 5-325 MG PO TABS   Oral   Take 1 tablet by mouth every 6 (six) hours as needed for pain.   30 tablet   0   . LABETALOL HCL 200 MG PO TABS   Oral   Take 200  mg by mouth 2 (two) times daily.         Marland Kitchen LEVETIRACETAM 500 MG PO TABS   Oral   Take 500-1,000 mg by mouth 2 (two) times daily. **Take one tablet daily in the morning and two tablets at bedtime**         . LUBIPROSTONE 8 MCG PO CAPS   Oral   Take 8 mcg by mouth 2 (two) times daily with a meal.          . PROMETHAZINE HCL 25 MG PO TABS   Oral   Take 1 tablet (25 mg total) by mouth every 6 (six) hours as needed for nausea.   15 tablet   0   . ZOLPIDEM TARTRATE 10 MG PO TABS   Oral   Take 10 mg by mouth at bedtime as needed. For sleep            BP 181/108  Pulse 125  Temp 97.8 F (36.6 C) (Oral)  Resp 20  Ht 5' 4.5" (1.638 m)  Wt 220 lb (99.791 kg)  BMI 37.18 kg/m2  SpO2 98%  Physical Exam  Nursing note and vitals reviewed. Constitutional: She is oriented to person, place, and time. She appears well-developed and well-nourished. No distress.  HENT:  Head: Normocephalic and atraumatic.  Eyes: EOM are normal.  Neck: Normal range of motion.  Cardiovascular: Normal rate, regular rhythm and normal heart sounds.   Pulmonary/Chest: Effort normal and breath sounds normal.  Abdominal: Soft. She exhibits no distension. There is tenderness. There  is no rebound and no guarding.       Epigastric and RUQ tenderness.   Musculoskeletal: Normal range of motion.  Neurological: She is alert and oriented to person, place, and time.  Skin: Skin is warm and dry.  Psychiatric: She has a normal mood and affect. Judgment normal.    ED Course  Procedures (including critical care time)  DIAGNOSTIC STUDIES: Oxygen Saturation is 98% on room air, normal by my interpretation.    COORDINATION OF CARE:  6:56 PM- Treatment plan concerning laboratory evaluation, nausea management, and pain management discussed with patient. Pt agrees with treatment.   9:10PM- Recheck. Pt reports feeling better. Treatment plan discussed with patient. Pt agrees with treatment.     Results for orders placed during the hospital encounter of 10/12/12  CBC      Component Value Range   WBC 9.1  4.0 - 10.5 K/uL   RBC 4.14  3.87 - 5.11 MIL/uL   Hemoglobin 10.8 (*) 12.0 - 15.0 g/dL   HCT 62.1 (*) 30.8 - 65.7 %   MCV 79.5  78.0 - 100.0 fL   MCH 26.1  26.0 - 34.0 pg   MCHC 32.8  30.0 - 36.0 g/dL   RDW 84.6  96.2 - 95.2 %   Platelets 240  150 - 400 K/uL  COMPREHENSIVE METABOLIC PANEL      Component Value Range   Sodium 138  135 - 145 mEq/L   Potassium 3.7  3.5 - 5.1 mEq/L   Chloride 104  96 - 112 mEq/L   CO2 25  19 - 32 mEq/L   Glucose, Bld 141 (*) 70 - 99 mg/dL   BUN 11  6 - 23 mg/dL   Creatinine, Ser 8.41  0.50 - 1.10 mg/dL   Calcium 9.0  8.4 - 32.4 mg/dL   Total Protein 7.1  6.0 - 8.3 g/dL   Albumin 3.5  3.5 - 5.2 g/dL   AST  48 (*) 0 - 37 U/L   ALT 54 (*) 0 - 35 U/L   Alkaline Phosphatase 103  39 - 117 U/L   Total Bilirubin 0.1 (*) 0.3 - 1.2 mg/dL   GFR calc non Af Amer >90  >90 mL/min   GFR calc Af Amer >90  >90 mL/min  LIPASE, BLOOD      Component Value Range   Lipase 53  11 - 59 U/L      No results found.   1. Nausea vomiting and diarrhea       MDM  9:25 PM The patient is sitting up in bed.  She feels much better at this time.  She's  been tolerating oral fluids.  Discharge home in good condition.  Repeat abdominal exam is benign.  This seems to be more nausea vomiting and diarrhea.  She does also have a long-standing history of recurrent nausea or vomiting.  She states she feels better would like to go home.  Close PCP followup.  She understands return to the ER for new or worsening symptoms      I personally performed the services described in this documentation, which was scribed in my presence. The recorded information has been reviewed and is accurate.      Danielle Co, MD 10/12/12 2126

## 2012-10-12 NOTE — ED Notes (Signed)
NVD since yesterday,abd pain.

## 2012-10-14 ENCOUNTER — Emergency Department (HOSPITAL_COMMUNITY)
Admission: EM | Admit: 2012-10-14 | Discharge: 2012-10-14 | Disposition: A | Discharge status: Home / Self Care | Payer: Medicare Other | Attending: Emergency Medicine | Admitting: Emergency Medicine

## 2012-10-14 ENCOUNTER — Emergency Department (HOSPITAL_COMMUNITY): Payer: Medicare Other

## 2012-10-14 ENCOUNTER — Encounter (HOSPITAL_COMMUNITY): Payer: Self-pay | Admitting: *Deleted

## 2012-10-14 DIAGNOSIS — R112 Nausea with vomiting, unspecified: Secondary | ICD-10-CM | POA: Insufficient documentation

## 2012-10-14 DIAGNOSIS — F411 Generalized anxiety disorder: Secondary | ICD-10-CM | POA: Insufficient documentation

## 2012-10-14 DIAGNOSIS — F319 Bipolar disorder, unspecified: Secondary | ICD-10-CM | POA: Insufficient documentation

## 2012-10-14 DIAGNOSIS — F329 Major depressive disorder, single episode, unspecified: Secondary | ICD-10-CM | POA: Insufficient documentation

## 2012-10-14 DIAGNOSIS — G8929 Other chronic pain: Secondary | ICD-10-CM | POA: Insufficient documentation

## 2012-10-14 DIAGNOSIS — R1033 Periumbilical pain: Secondary | ICD-10-CM | POA: Insufficient documentation

## 2012-10-14 DIAGNOSIS — Z79899 Other long term (current) drug therapy: Secondary | ICD-10-CM | POA: Insufficient documentation

## 2012-10-14 DIAGNOSIS — R569 Unspecified convulsions: Secondary | ICD-10-CM | POA: Insufficient documentation

## 2012-10-14 DIAGNOSIS — Z9889 Other specified postprocedural states: Secondary | ICD-10-CM | POA: Insufficient documentation

## 2012-10-14 DIAGNOSIS — K219 Gastro-esophageal reflux disease without esophagitis: Secondary | ICD-10-CM | POA: Insufficient documentation

## 2012-10-14 DIAGNOSIS — E7800 Pure hypercholesterolemia, unspecified: Secondary | ICD-10-CM | POA: Insufficient documentation

## 2012-10-14 DIAGNOSIS — R748 Abnormal levels of other serum enzymes: Secondary | ICD-10-CM | POA: Insufficient documentation

## 2012-10-14 DIAGNOSIS — F3289 Other specified depressive episodes: Secondary | ICD-10-CM | POA: Insufficient documentation

## 2012-10-14 DIAGNOSIS — R109 Unspecified abdominal pain: Secondary | ICD-10-CM

## 2012-10-14 DIAGNOSIS — Z9089 Acquired absence of other organs: Secondary | ICD-10-CM | POA: Insufficient documentation

## 2012-10-14 DIAGNOSIS — K5090 Crohn's disease, unspecified, without complications: Secondary | ICD-10-CM | POA: Insufficient documentation

## 2012-10-14 DIAGNOSIS — I10 Essential (primary) hypertension: Secondary | ICD-10-CM | POA: Insufficient documentation

## 2012-10-14 LAB — URINALYSIS, ROUTINE W REFLEX MICROSCOPIC
Hgb urine dipstick: NEGATIVE
Leukocytes, UA: NEGATIVE
Nitrite: NEGATIVE
Protein, ur: NEGATIVE mg/dL
Specific Gravity, Urine: <1.005 — ABNORMAL LOW (ref 1.005–1.030)
Urobilinogen, UA: 0.2 mg/dL (ref 0.0–1.0)

## 2012-10-14 LAB — COMPREHENSIVE METABOLIC PANEL
ALT: 78 U/L — ABNORMAL HIGH (ref 0–35)
Calcium: 9.8 mg/dL (ref 8.4–10.5)
Creatinine, Ser: 0.77 mg/dL (ref 0.50–1.10)
GFR calc Af Amer: >90 mL/min (ref >90)
Glucose, Bld: 95 mg/dL (ref 70–99)
Sodium: 138 mEq/L (ref 135–145)
Total Protein: 7.5 g/dL (ref 6.0–8.3)

## 2012-10-14 LAB — CBC WITH DIFFERENTIAL/PLATELET
Basophils Absolute: 0 10*3/uL (ref 0.0–0.1)
Eosinophils Absolute: 0.3 10*3/uL (ref 0.0–0.7)
Eosinophils Relative: 3 % (ref 0–5)
Lymphs Abs: 2.8 10*3/uL (ref 0.7–4.0)
MCH: 26.3 pg (ref 26.0–34.0)
MCV: 79.9 fL (ref 78.0–100.0)
Monocytes Absolute: 0.6 10*3/uL (ref 0.1–1.0)
Platelets: 249 10*3/uL (ref 150–400)
RDW: 14.8 % (ref 11.5–15.5)

## 2012-10-14 LAB — LIPASE, BLOOD: Lipase: 31 U/L (ref 11–59)

## 2012-10-14 MED ORDER — ONDANSETRON HCL 4 MG/2ML IJ SOLN: 4.0000 mg | Freq: Once | INTRAMUSCULAR | Status: DC

## 2012-10-14 MED ORDER — PROMETHAZINE HCL 25 MG/ML IJ SOLN: 25.0000 mg | Freq: Once | INTRAMUSCULAR | Status: DC

## 2012-10-14 MED ORDER — HYDROMORPHONE HCL PF 2 MG/ML IJ SOLN
2.0000 mg | Freq: Once | INTRAMUSCULAR | Status: AC
  Administered 2012-10-14: 2 mg via INTRAMUSCULAR
  Filled 2012-10-14: qty 1

## 2012-10-14 MED ORDER — HYDROMORPHONE HCL PF 1 MG/ML IJ SOLN
1.0000 mg | Freq: Once | INTRAMUSCULAR | Status: AC
  Administered 2012-10-14: 1 mg via INTRAMUSCULAR
  Filled 2012-10-14: qty 1

## 2012-10-14 MED ORDER — PROMETHAZINE HCL 25 MG/ML IJ SOLN
25.0000 mg | Freq: Once | INTRAMUSCULAR | Status: AC
  Administered 2012-10-14: 25 mg via INTRAMUSCULAR
  Filled 2012-10-14: qty 1

## 2012-10-14 MED ORDER — PROMETHAZINE HCL 25 MG PO TABS: 25.0000 mg | ORAL_TABLET | Freq: Four times a day (QID) | ORAL | Status: DC | PRN

## 2012-10-14 MED ORDER — SODIUM CHLORIDE 0.9 % IV SOLN: INTRAVENOUS | Status: DC

## 2012-10-14 MED ORDER — HYDROMORPHONE HCL PF 1 MG/ML IJ SOLN: 1.0000 mg | Freq: Once | INTRAMUSCULAR | Status: DC

## 2012-10-14 MED ORDER — OXYCODONE-ACETAMINOPHEN 5-325 MG PO TABS: 1.0000 | ORAL_TABLET | Freq: Four times a day (QID) | ORAL | Status: DC | PRN

## 2012-10-14 MED ORDER — SODIUM CHLORIDE 0.9 % IV BOLUS (SEPSIS): 1000.0000 mL | Freq: Once | INTRAVENOUS | Status: DC

## 2012-10-14 NOTE — ED Notes (Signed)
abd pain, NVd  .  Seen her for same on 11/13

## 2012-10-14 NOTE — ED Provider Notes (Signed)
History   This chart was scribed for Donnetta Hutching, MD by Toya Smothers, ED Scribe. The patient was seen in room APA03/APA03. Patient's care was started at 1400.  CSN: [id number]  Arrival date & time 10/14/12  1400   First MD Initiated Contact with Patient 10/14/12 1431      Chief Complaint  Patient presents with  . Abdominal Pain   Patient is a 51 y.o. female presenting with abdominal pain. The history is provided by the patient. No language interpreter was used.  Abdominal Pain The primary symptoms of the illness include abdominal pain, nausea and vomiting. The current episode started more than 2 days ago. The onset of the illness was gradual. The problem has not changed since onset. The vomiting began today. Vomiting occurs 2 to 5 times per day. The emesis contains stomach contents.  The patient has not had a change in bowel habit.    [name] is a 51 y.o. female who presents to the Emergency Department complaining of 5 days of constant, moderate, unchanged periumbilical abdominal pain with associate nausea and emesis twice today. Pain is aggravated with palpation and certain postions, while alleviated by nothing. Pt was evaluated at [facility] 6 days ago for the same symptoms. Despite Rx of Hydrocodone, Pt denies relief of pain. No fever, chills, cough, congestion, rhinorrhea, chest pain, or SOB. Medical Hx includes Acid Reflux, stomach ulcers, and chronic pain.    Past Medical History  Diagnosis Date  . Hypertension   . Bipolar 1 disorder   . Depression   . Hypercholesteremia   . Acid reflux   . Chronic pain   . Stomach ulcer   . Headache   . Anxiety   . Crohn's disease   . Seizures     Past Surgical History  Procedure Date  . Cholecystectomy   . Tonsillectomy   . Stomach surgery   . Tubal ligation     Family History  Problem Relation Age of Onset  . Hypertension Father   . Diabetes type II Brother   . Hypertension Brother   . Diabetes type II Mother      History  Substance Use Topics  . Smoking status: Never Smoker   . Smokeless tobacco: Not on file  . Alcohol Use: No    Review of Systems  Gastrointestinal: Positive for nausea, vomiting and abdominal pain.  All other systems reviewed and are negative.    Allergies  Compazine; Penicillins; Aspirin; Cephalexin; Ketorolac tromethamine; Metoclopramide hcl; Ondansetron hcl; Sumatriptan; Tetracycline; and Tramadol  Home Medications   Current Outpatient Rx  Name  Route  Sig  Dispense  Refill  . ACETAMINOPHEN 500 MG PO TABS   Oral   Take 500-1,000 mg by mouth daily as needed. pain         . ARIPIPRAZOLE 10 MG PO TABS   Oral   Take 10 mg by mouth at bedtime.          Marland Kitchen CLONIDINE HCL 0.1 MG PO TABS   Oral   Take 0.1 mg by mouth at bedtime.         Marland Kitchen DIAZEPAM 10 MG PO TABS   Oral   Take 10 mg by mouth 2 (two) times daily.         . DULOXETINE HCL 60 MG PO CPEP   Oral   Take 60 mg by mouth every morning.          Marland Kitchen HYDROCODONE-ACETAMINOPHEN 5-325 MG PO TABS  Oral   Take 1 tablet by mouth every 4 (four) hours as needed for pain.   15 tablet   0   . LABETALOL HCL 200 MG PO TABS   Oral   Take 200 mg by mouth 2 (two) times daily.         Marland Kitchen LEVETIRACETAM 500 MG PO TABS   Oral   Take 500-1,000 mg by mouth 2 (two) times daily. **Take one tablet daily in the morning and two tablets at bedtime**         . LUBIPROSTONE 8 MCG PO CAPS   Oral   Take 8 mcg by mouth 2 (two) times daily with a meal.          . PROMETHAZINE HCL 25 MG PO TABS   Oral   Take 1 tablet (25 mg total) by mouth every 6 (six) hours as needed for nausea.   12 tablet   0     BP 152/97  Pulse 90  Temp 97.8 F (36.6 C) (Oral)  Resp 20  Ht 5\' 4"  (1.626 m)  Wt 223 lb (101.152 kg)  BMI 38.28 kg/m2  SpO2 100%  Physical Exam  Nursing note and vitals reviewed. Constitutional: She is oriented to person, place, and time. She appears well-developed and well-nourished.       Obese   HENT:  Head: Normocephalic and atraumatic.  Eyes: Conjunctivae normal and EOM are normal. Pupils are equal, round, and reactive to light.  Neck: Normal range of motion. Neck supple.  Cardiovascular: Normal rate, regular rhythm and normal heart sounds.   Pulmonary/Chest: Effort normal and breath sounds normal.  Abdominal: Soft. Bowel sounds are normal.       Minimally tender in periumbilical region.  Musculoskeletal: Normal range of motion.  Neurological: She is alert and oriented to person, place, and time.  Skin: Skin is warm and dry.  Psychiatric: She has a normal mood and affect.    ED Course  Procedures DIAGNOSTIC STUDIES: Oxygen Saturation is 100% on room air, normal by my interpretation.    COORDINATION OF CARE: 14:57- Evaluated Pt. Pt is awake, alert, and without distress. 15:12- Ordered DG Abd Acute W/Chest 1 time imaging. 15:12- Ordered CBC with Differential, Comprehensive metabolic panel, Lipase blood, and Uranalysis.   Labs Reviewed  CBC WITH DIFFERENTIAL - Abnormal; Notable for the following:    Hemoglobin 11.4 (*)     HCT 34.6 (*)     All other components within normal limits  COMPREHENSIVE METABOLIC PANEL - Abnormal; Notable for the following:    AST 77 (*)     ALT 78 (*)     Total Bilirubin 0.2 (*)     All other components within normal limits  LIPASE, BLOOD  URINALYSIS, ROUTINE W REFLEX MICROSCOPIC   Dg Abd Acute W/chest  10/14/2012  *RADIOLOGY REPORT*  Clinical Data: Periumbilical pain  ACUTE ABDOMEN SERIES (ABDOMEN 2 VIEW & CHEST 1 VIEW)  Comparison: Chest radiograph 07/25/2012, abdominal radiographs 04/17/2012 Correlation:  CT abdomen and pelvis 10/04/2012  Findings: Normal heart size, mediastinal contours, and pulmonary vascularity. Bronchitic changes with linear subsegmental atelectasis at lingula. No acute infiltrate or pleural effusion. Surgical clips in the upper abdomen bilaterally. Prominent stool within transverse and proximal descending colon. No  bowel dilatation, bowel wall thickening or free intraperitoneal air. Food debris within stomach. Bones appear demineralized. No definite urinary tract calcification.  IMPRESSION: Mildly prominent stool within the transverse and descending colon. Bronchitic changes with minimal linear subsegmental atelectasis at lingula.  Original Report Authenticated By: Ulyses Southward, M.D.    No results found.   No diagnosis found.    MDM  No acute abdomen. Liver functions minimally elevated.  White count normal. Increase fluids. Rx for Percocet #20 and Phenergan 25 mg #20 to      I personally performed the services described in this documentation, which was scribed in my presence. The recorded information has been reviewed and is accurate.      Donnetta Hutching, MD 10/14/12 [phone]

## 2012-10-15 ENCOUNTER — Emergency Department (HOSPITAL_COMMUNITY)
Admission: EM | Admit: 2012-10-15 | Discharge: 2012-10-15 | Disposition: A | Discharge status: Home / Self Care | Payer: Medicare Other | Attending: Emergency Medicine | Admitting: Emergency Medicine

## 2012-10-15 ENCOUNTER — Encounter (HOSPITAL_COMMUNITY): Payer: Self-pay | Admitting: Emergency Medicine

## 2012-10-15 DIAGNOSIS — Z79899 Other long term (current) drug therapy: Secondary | ICD-10-CM | POA: Insufficient documentation

## 2012-10-15 DIAGNOSIS — F3289 Other specified depressive episodes: Secondary | ICD-10-CM | POA: Insufficient documentation

## 2012-10-15 DIAGNOSIS — F411 Generalized anxiety disorder: Secondary | ICD-10-CM | POA: Insufficient documentation

## 2012-10-15 DIAGNOSIS — E7800 Pure hypercholesterolemia, unspecified: Secondary | ICD-10-CM | POA: Insufficient documentation

## 2012-10-15 DIAGNOSIS — R51 Headache: Secondary | ICD-10-CM | POA: Insufficient documentation

## 2012-10-15 DIAGNOSIS — R1033 Periumbilical pain: Secondary | ICD-10-CM | POA: Insufficient documentation

## 2012-10-15 DIAGNOSIS — F319 Bipolar disorder, unspecified: Secondary | ICD-10-CM | POA: Insufficient documentation

## 2012-10-15 DIAGNOSIS — J029 Acute pharyngitis, unspecified: Secondary | ICD-10-CM | POA: Insufficient documentation

## 2012-10-15 DIAGNOSIS — G8929 Other chronic pain: Secondary | ICD-10-CM | POA: Insufficient documentation

## 2012-10-15 DIAGNOSIS — F329 Major depressive disorder, single episode, unspecified: Secondary | ICD-10-CM | POA: Insufficient documentation

## 2012-10-15 DIAGNOSIS — Z9889 Other specified postprocedural states: Secondary | ICD-10-CM | POA: Insufficient documentation

## 2012-10-15 DIAGNOSIS — R112 Nausea with vomiting, unspecified: Secondary | ICD-10-CM | POA: Insufficient documentation

## 2012-10-15 DIAGNOSIS — K219 Gastro-esophageal reflux disease without esophagitis: Secondary | ICD-10-CM | POA: Insufficient documentation

## 2012-10-15 DIAGNOSIS — R569 Unspecified convulsions: Secondary | ICD-10-CM | POA: Insufficient documentation

## 2012-10-15 DIAGNOSIS — Z9089 Acquired absence of other organs: Secondary | ICD-10-CM | POA: Insufficient documentation

## 2012-10-15 DIAGNOSIS — I10 Essential (primary) hypertension: Secondary | ICD-10-CM | POA: Insufficient documentation

## 2012-10-15 MED ORDER — PROMETHAZINE HCL 25 MG PO TABS: 25.0000 mg | ORAL_TABLET | Freq: Four times a day (QID) | ORAL | Status: AC | PRN

## 2012-10-15 MED ORDER — OXYCODONE-ACETAMINOPHEN 5-325 MG PO TABS: 1.0000 | ORAL_TABLET | Freq: Four times a day (QID) | ORAL | Status: AC | PRN

## 2012-10-15 NOTE — ED Notes (Signed)
Pt c/o abd pain with nausea and vomiting. Pt state she has been here the last two days for the same.

## 2012-10-15 NOTE — ED Notes (Signed)
Pt returns to er today with continued pain, n/v, was seen in er on 10-12-2012, and again on 10-14-2012. Pt states that she did not get her prescriptions and discharge paperwork when she left on 10-14-2012. Pt advises that she has been taking phenergan at home without relief.

## 2012-10-15 NOTE — ED Notes (Addendum)
Pt called out to nursing desk, requesting the pain medication that she had been ordered, spoke with Dr. Deretha Emory who advised that he had not been in to see pt yet, that he was reviewing her chart, advised pt that Dr. Eulah Citizen be in to see her shortly, pt has had not n/v while in er this am,

## 2012-10-15 NOTE — ED Provider Notes (Signed)
History   This chart was scribed for Danielle Jakes, MD, by Marcina Millard scribe. The patient was seen in room APA08/APA08 and the patient's care was started at 0910.    CSN: [id number]  Arrival date & time 10/15/12  8469   First MD Initiated Contact with Patient 10/15/12 [phone]      Chief Complaint  Patient presents with  . Abdominal Pain  . Nausea  . Emesis    (Consider location/radiation/quality/duration/timing/severity/associated sxs/prior treatment) HPI Comments: [name] is a 51 y.o. female with a h/o of chronic abdominal pain who presents to the Emergency Department complaining of constant, moderate periumbilical abdominal pain with associated nausea and vomiting that began 7 days ago. She also reports a headache and sore throat. She denies any diarrhea, rashes, or cough. She denies that any blood is present when she vomits and states that her BMs are regular. She states that she has a h/o of Crohn's disease. Nursing reports that this is her 3rd visit to ED in 3 days and that she left the ED yesterday without signing her discharge papers or picking up her prescriptions.     Past Medical History  Diagnosis Date  . Hypertension   . Bipolar 1 disorder   . Depression   . Hypercholesteremia   . Acid reflux   . Chronic pain   . Stomach ulcer   . Headache   . Anxiety   . Crohn's disease   . Seizures     Past Surgical History  Procedure Date  . Cholecystectomy   . Tonsillectomy   . Stomach surgery   . Tubal ligation     Family History  Problem Relation Age of Onset  . Hypertension Father   . Diabetes type II Brother   . Hypertension Brother   . Diabetes type II Mother     History  Substance Use Topics  . Smoking status: Never Smoker   . Smokeless tobacco: Not on file  . Alcohol Use: No    OB History    Grav Para Term Preterm Abortions TAB SAB Ect Mult Living                  Review of Systems  HENT: Positive for sore throat.     Respiratory: Negative for cough.   Gastrointestinal: Positive for nausea, vomiting and abdominal pain. Negative for diarrhea.  Skin: Negative for rash.  Neurological: Positive for headaches.  All other systems reviewed and are negative.    Allergies  Compazine; Penicillins; Aspirin; Cephalexin; Ketorolac tromethamine; Metoclopramide hcl; Ondansetron hcl; Sumatriptan; and Tramadol  Home Medications   Current Outpatient Rx  Name  Route  Sig  Dispense  Refill  . ACETAMINOPHEN 500 MG PO TABS   Oral   Take 500-1,000 mg by mouth daily as needed. pain         . ARIPIPRAZOLE 10 MG PO TABS   Oral   Take 10 mg by mouth at bedtime.          Marland Kitchen CLONIDINE HCL 0.1 MG PO TABS   Oral   Take 0.1 mg by mouth at bedtime.         Marland Kitchen DIAZEPAM 10 MG PO TABS   Oral   Take 10 mg by mouth 2 (two) times daily.         . DULOXETINE HCL 60 MG PO CPEP   Oral   Take 60 mg by mouth every morning.          Marland Kitchen  LABETALOL HCL 200 MG PO TABS   Oral   Take 200 mg by mouth 2 (two) times daily.         Marland Kitchen LEVETIRACETAM 500 MG PO TABS   Oral   Take 500-1,000 mg by mouth 2 (two) times daily. **Take one tablet daily in the morning and two tablets at bedtime**         . LUBIPROSTONE 8 MCG PO CAPS   Oral   Take 8 mcg by mouth 2 (two) times daily with a meal.          . PROMETHAZINE HCL 25 MG PO TABS   Oral   Take 1 tablet (25 mg total) by mouth every 6 (six) hours as needed for nausea.   12 tablet   0   . OXYCODONE-ACETAMINOPHEN 5-325 MG PO TABS   Oral   Take 1-2 tablets by mouth every 6 (six) hours as needed for pain.   20 tablet   0   . PROMETHAZINE HCL 25 MG PO TABS   Oral   Take 1 tablet (25 mg total) by mouth every 6 (six) hours as needed for nausea.   20 tablet   0     BP 106/95  Pulse 112  Temp 97.8 F (36.6 C) (Oral)  Resp 18  Ht 5' 4.5" (1.638 m)  Wt 228 lb (103.42 kg)  BMI 38.53 kg/m2  SpO2 96%  Physical Exam  Nursing note and vitals  reviewed. Constitutional: She is oriented to person, place, and time. She appears well-developed and well-nourished.  Eyes: Pupils are equal, round, and reactive to light.  Neck: Normal range of motion. Neck supple.  Cardiovascular: Normal rate, regular rhythm and normal heart sounds.   Pulmonary/Chest: Effort normal and breath sounds normal. No respiratory distress.  Abdominal: Bowel sounds are normal. There is no tenderness.  Musculoskeletal: Normal range of motion.  Neurological: She is alert and oriented to person, place, and time. No cranial nerve deficit. She exhibits normal muscle tone. Coordination normal.  Psychiatric: She has a normal mood and affect. Her behavior is normal.    ED Course  Procedures (including critical care time)  DIAGNOSTIC STUDIES: Oxygen Saturation is 96% on room air, normal by my interpretation.    COORDINATION OF CARE:  Discussed planned course of treatment with the patient, including reissuing the prescriptions that she was given in ED yesterday but did not take home, who is agreeable at this time.    Labs Reviewed - No data to display Dg Abd Acute W/chest  10/14/2012  *RADIOLOGY REPORT*  Clinical Data: Periumbilical pain  ACUTE ABDOMEN SERIES (ABDOMEN 2 VIEW & CHEST 1 VIEW)  Comparison: Chest radiograph 07/25/2012, abdominal radiographs 04/17/2012 Correlation:  CT abdomen and pelvis 10/04/2012  Findings: Normal heart size, mediastinal contours, and pulmonary vascularity. Bronchitic changes with linear subsegmental atelectasis at lingula. No acute infiltrate or pleural effusion. Surgical clips in the upper abdomen bilaterally. Prominent stool within transverse and proximal descending colon. No bowel dilatation, bowel wall thickening or free intraperitoneal air. Food debris within stomach. Bones appear demineralized. No definite urinary tract calcification.  IMPRESSION: Mildly prominent stool within the transverse and descending colon. Bronchitic changes with  minimal linear subsegmental atelectasis at lingula.   Original Report Authenticated By: Ulyses Southward, M.D.      1. Chronic abdominal pain       MDM  Patient with a history of chronic abdominal pain. Patient was seen on November 13 and 15th labs were reviewed from that  time without any acute changes. X-rays from yesterday of as listed above showed no significant abnormalities. Patient's abdomen is soft and nontender. Patient left yesterday without getting her prescriptions filled. Or even having her prescriptions provided. They were shredded here. How will go ahead and rewrite for those that was Phenergan and Percocet. Patient will followup with her primary care Dr. Rise Mu no acute distress today.  I personally performed the services described in this documentation, which was scribed in my presence. The recorded information has been reviewed and is accurate.        Danielle Jakes, MD 10/15/12 1017

## 2012-10-15 NOTE — ED Notes (Signed)
Pt left department with discharge paperwork and prescriptions in hand, pt expressed understanding on follow up with pcp in a few days

## 2012-10-15 NOTE — ED Notes (Signed)
Dr. Zackowksi in room with pt  

## 2012-10-29 ENCOUNTER — Emergency Department (HOSPITAL_COMMUNITY): Payer: Medicare Other

## 2012-10-29 ENCOUNTER — Encounter (HOSPITAL_COMMUNITY): Payer: Self-pay | Admitting: Emergency Medicine

## 2012-10-29 ENCOUNTER — Emergency Department (HOSPITAL_COMMUNITY)
Admission: EM | Admit: 2012-10-29 | Discharge: 2012-10-29 | Disposition: A | Discharge status: Home / Self Care | Payer: Medicare Other | Attending: Emergency Medicine | Admitting: Emergency Medicine

## 2012-10-29 DIAGNOSIS — F411 Generalized anxiety disorder: Secondary | ICD-10-CM | POA: Insufficient documentation

## 2012-10-29 DIAGNOSIS — E7800 Pure hypercholesterolemia, unspecified: Secondary | ICD-10-CM | POA: Insufficient documentation

## 2012-10-29 DIAGNOSIS — F3289 Other specified depressive episodes: Secondary | ICD-10-CM | POA: Insufficient documentation

## 2012-10-29 DIAGNOSIS — Z79899 Other long term (current) drug therapy: Secondary | ICD-10-CM | POA: Insufficient documentation

## 2012-10-29 DIAGNOSIS — M25579 Pain in unspecified ankle and joints of unspecified foot: Secondary | ICD-10-CM | POA: Insufficient documentation

## 2012-10-29 DIAGNOSIS — F329 Major depressive disorder, single episode, unspecified: Secondary | ICD-10-CM | POA: Insufficient documentation

## 2012-10-29 DIAGNOSIS — G40909 Epilepsy, unspecified, not intractable, without status epilepticus: Secondary | ICD-10-CM | POA: Insufficient documentation

## 2012-10-29 DIAGNOSIS — Z8719 Personal history of other diseases of the digestive system: Secondary | ICD-10-CM | POA: Insufficient documentation

## 2012-10-29 DIAGNOSIS — I10 Essential (primary) hypertension: Secondary | ICD-10-CM | POA: Insufficient documentation

## 2012-10-29 DIAGNOSIS — Z8711 Personal history of peptic ulcer disease: Secondary | ICD-10-CM | POA: Insufficient documentation

## 2012-10-29 DIAGNOSIS — K5090 Crohn's disease, unspecified, without complications: Secondary | ICD-10-CM | POA: Insufficient documentation

## 2012-10-29 DIAGNOSIS — F319 Bipolar disorder, unspecified: Secondary | ICD-10-CM | POA: Insufficient documentation

## 2012-10-29 DIAGNOSIS — G8929 Other chronic pain: Secondary | ICD-10-CM | POA: Insufficient documentation

## 2012-10-29 MED ORDER — HYDROCODONE-ACETAMINOPHEN 5-500 MG PO TABS: 1.0000 | ORAL_TABLET | Freq: Four times a day (QID) | ORAL | Status: DC | PRN

## 2012-10-29 MED ORDER — COLCHICINE 0.6 MG PO TABS
1.2000 mg | ORAL_TABLET | Freq: Once | ORAL | Status: AC
  Administered 2012-10-29: 1.2 mg via ORAL
  Filled 2012-10-29: qty 2

## 2012-10-29 MED ORDER — OXYCODONE-ACETAMINOPHEN 5-325 MG PO TABS
2.0000 | ORAL_TABLET | Freq: Once | ORAL | Status: AC
  Administered 2012-10-29: 2 via ORAL
  Filled 2012-10-29: qty 2

## 2012-10-29 NOTE — ED Notes (Signed)
Patient c/o left ankle pain x 2-3 days.  Patient states she has been using icy hot and ace wrap without relief.  States doesn't recall any injury to ankle.

## 2012-10-29 NOTE — ED Notes (Addendum)
Pt reports pain in left ankle.  States pain has progressively gotten worse over past 2 days.  No relief from OTC medications.

## 2012-10-29 NOTE — ED Provider Notes (Signed)
History     CSN: [id number]  Arrival date & time 10/29/12  1308   First MD Initiated Contact with Patient 10/29/12 0541      Chief Complaint  Patient presents with  . Ankle Pain    (Consider location/radiation/quality/duration/timing/severity/associated sxs/prior treatment) HPI Comments: 51 year old female with gradual onset of left ankle pain which started yesterday. This has been persistent, worse with ambulation, not associated with fevers chills rashes or swelling of the ankle. She denies history of any other joint problems and has not had any other joints bothering her today. She has been using an Ace wrap this evening with minimal improvement.the patient denies any traumatic injuries to her ankle.  Patient is a 51 y.o. female presenting with ankle pain. The history is provided by the patient and medical records.  Ankle Pain     Past Medical History  Diagnosis Date  . Hypertension   . Bipolar 1 disorder   . Depression   . Hypercholesteremia   . Acid reflux   . Chronic pain   . Stomach ulcer   . Headache   . Anxiety   . Crohn's disease   . Seizures     Past Surgical History  Procedure Date  . Cholecystectomy   . Tonsillectomy   . Stomach surgery   . Tubal ligation     Family History  Problem Relation Age of Onset  . Hypertension Father   . Diabetes type II Brother   . Hypertension Brother   . Diabetes type II Mother     History  Substance Use Topics  . Smoking status: Never Smoker   . Smokeless tobacco: Not on file  . Alcohol Use: No    OB History    Grav Para Term Preterm Abortions TAB SAB Ect Mult Living                  Review of Systems  Constitutional: Negative for fever.  Cardiovascular: Negative for leg swelling.  Musculoskeletal: Negative for joint swelling.  Skin: Negative for rash and wound.  Hematological: Does not bruise/bleed easily.    Allergies  Compazine; Penicillins; Aspirin; Cephalexin; Ketorolac tromethamine;  Metoclopramide hcl; Ondansetron hcl; Sumatriptan; and Tramadol  Home Medications   Current Outpatient Rx  Name  Route  Sig  Dispense  Refill  . ACETAMINOPHEN 500 MG PO TABS   Oral   Take 500-1,000 mg by mouth daily as needed. pain         . ARIPIPRAZOLE 10 MG PO TABS   Oral   Take 10 mg by mouth at bedtime.          Marland Kitchen CLONIDINE HCL 0.1 MG PO TABS   Oral   Take 0.1 mg by mouth at bedtime.         Marland Kitchen DIAZEPAM 10 MG PO TABS   Oral   Take 10 mg by mouth 2 (two) times daily.         . DULOXETINE HCL 60 MG PO CPEP   Oral   Take 60 mg by mouth every morning.          Marland Kitchen LABETALOL HCL 200 MG PO TABS   Oral   Take 200 mg by mouth 2 (two) times daily.         Marland Kitchen LEVETIRACETAM 500 MG PO TABS   Oral   Take 500-1,000 mg by mouth 2 (two) times daily. **Take one tablet daily in the morning and two tablets at bedtime**         .  LUBIPROSTONE 8 MCG PO CAPS   Oral   Take 8 mcg by mouth 2 (two) times daily with a meal.          . HYDROCODONE-ACETAMINOPHEN 5-500 MG PO TABS   Oral   Take 1-2 tablets by mouth every 6 (six) hours as needed for pain.   15 tablet   0   . PROMETHAZINE HCL 25 MG PO TABS   Oral   Take 1 tablet (25 mg total) by mouth every 6 (six) hours as needed for nausea.   12 tablet   0     BP 181/106  Pulse 112  Temp 97.8 F (36.6 C) (Oral)  Resp 22  SpO2 100%  Physical Exam  Nursing note and vitals reviewed. Constitutional: She appears well-developed and well-nourished. No distress.  HENT:  Head: Normocephalic and atraumatic.  Eyes: Conjunctivae normal are normal. No scleral icterus.  Cardiovascular: Normal rate, regular rhythm and intact distal pulses.   Pulmonary/Chest: Effort normal and breath sounds normal.  Musculoskeletal: She exhibits tenderness ( mild tenderness to palpation around the ankle inferior to the malleoli, mild pain with range of motion, no redness warmth or swelling to the joint of the left ankle). She exhibits no edema.        Other than the left ankle, there is no other pain or tenderness to any other major joints of the extremities  Neurological: She is alert.       Normal sensation to light touch and pinprick around the left lower extremity  Skin: Skin is warm and dry. No rash noted. She is not diaphoretic.    ED Course  Procedures (including critical care time)  Labs Reviewed - No data to display Dg Ankle Complete Left  10/29/2012  *RADIOLOGY REPORT*  Clinical Data: Generalized left ankle pain for 2 days.  LEFT ANKLE COMPLETE - 3+ VIEW  Comparison: None.  Findings: There is no evidence of fracture or dislocation.  The ankle mortise is intact; the interosseous space is within normal limits.  No talar tilt or subluxation is seen.  Focal prominence of the navicular on the oblique view is likely degenerative in nature. Plantar and posterior calcaneal spurs are seen.  There is apparent thickening of the soft tissues at the level of the distal Achilles tendon, raising question for Achilles tendinosis.  The joint spaces are preserved.  No additional soft tissue abnormalities are seen.  IMPRESSION:  1.  No evidence of fracture or dislocation. 2.  Apparent thickening of the soft tissues at the level of the distal Achilles tendon raises question for Achilles tendinosis; suggest clinical correlation for associated symptoms.   Original Report Authenticated By: Tonia Ghent, M.D.      1. Ankle pain       MDM  The patient appears well, she has focal tenderness of her left ankle, would consider monoarticular arthritis in this case likely to be related to gout. She has no fever and no signs of septic arthritis. Pain medication ordered, x-rays ordered.   X-rays negative for fracture, dislocation or significant amount of swelling. The patient has no tenderness over the Achilles tendon. The patient will be discharged with pain medication, followup as outpatient. She has anti-inflammatory allergies, will hold NSAIDs at this  time     Vida Roller, MD 10/29/12 [phone]

## 2012-11-01 ENCOUNTER — Encounter (HOSPITAL_COMMUNITY): Payer: Self-pay | Admitting: *Deleted

## 2012-11-01 ENCOUNTER — Emergency Department (HOSPITAL_COMMUNITY)
Admission: EM | Admit: 2012-11-01 | Discharge: 2012-11-01 | Disposition: A | Discharge status: Home / Self Care | Payer: Medicare Other | Attending: Emergency Medicine | Admitting: Emergency Medicine

## 2012-11-01 DIAGNOSIS — F3289 Other specified depressive episodes: Secondary | ICD-10-CM | POA: Insufficient documentation

## 2012-11-01 DIAGNOSIS — R300 Dysuria: Secondary | ICD-10-CM | POA: Insufficient documentation

## 2012-11-01 DIAGNOSIS — M545 Low back pain, unspecified: Secondary | ICD-10-CM | POA: Insufficient documentation

## 2012-11-01 DIAGNOSIS — Z79899 Other long term (current) drug therapy: Secondary | ICD-10-CM | POA: Insufficient documentation

## 2012-11-01 DIAGNOSIS — F319 Bipolar disorder, unspecified: Secondary | ICD-10-CM | POA: Insufficient documentation

## 2012-11-01 DIAGNOSIS — E7800 Pure hypercholesterolemia, unspecified: Secondary | ICD-10-CM | POA: Insufficient documentation

## 2012-11-01 DIAGNOSIS — Z8719 Personal history of other diseases of the digestive system: Secondary | ICD-10-CM | POA: Insufficient documentation

## 2012-11-01 DIAGNOSIS — Z9889 Other specified postprocedural states: Secondary | ICD-10-CM | POA: Insufficient documentation

## 2012-11-01 DIAGNOSIS — G40909 Epilepsy, unspecified, not intractable, without status epilepticus: Secondary | ICD-10-CM | POA: Insufficient documentation

## 2012-11-01 DIAGNOSIS — F329 Major depressive disorder, single episode, unspecified: Secondary | ICD-10-CM | POA: Insufficient documentation

## 2012-11-01 DIAGNOSIS — M549 Dorsalgia, unspecified: Secondary | ICD-10-CM

## 2012-11-01 DIAGNOSIS — F411 Generalized anxiety disorder: Secondary | ICD-10-CM | POA: Insufficient documentation

## 2012-11-01 DIAGNOSIS — I10 Essential (primary) hypertension: Secondary | ICD-10-CM | POA: Insufficient documentation

## 2012-11-01 LAB — URINALYSIS, ROUTINE W REFLEX MICROSCOPIC
Leukocytes, UA: NEGATIVE
Nitrite: NEGATIVE
Protein, ur: NEGATIVE mg/dL
Urobilinogen, UA: 0.2 mg/dL (ref 0.0–1.0)

## 2012-11-01 MED ORDER — CYCLOBENZAPRINE HCL 10 MG PO TABS
10.0000 mg | ORAL_TABLET | Freq: Once | ORAL | Status: AC
  Administered 2012-11-01: 10 mg via ORAL
  Filled 2012-11-01: qty 1

## 2012-11-01 MED ORDER — OXYCODONE-ACETAMINOPHEN 5-325 MG PO TABS
1.0000 | ORAL_TABLET | Freq: Once | ORAL | Status: AC
  Administered 2012-11-01: 1 via ORAL
  Filled 2012-11-01: qty 1

## 2012-11-01 MED ORDER — CYCLOBENZAPRINE HCL 10 MG PO TABS: 10.0000 mg | ORAL_TABLET | Freq: Three times a day (TID) | ORAL | Status: DC | PRN

## 2012-11-01 MED ORDER — OXYCODONE-ACETAMINOPHEN 5-325 MG PO TABS: 1.0000 | ORAL_TABLET | ORAL | Status: AC | PRN

## 2012-11-01 NOTE — ED Notes (Signed)
Pain low back and odor to urine,  Seen here recently for same.Increased pain with movement.

## 2012-11-01 NOTE — ED Provider Notes (Signed)
History     CSN: [id number]  Arrival date & time 11/01/12  1135   First MD Initiated Contact with Patient 11/01/12 1210      Chief Complaint  Patient presents with  . Back Pain    (Consider location/radiation/quality/duration/timing/severity/associated sxs/prior treatment) HPI Comments: Patient c/o low back pain and dysuria for several days.  States she was seen here recently for same.  Reports decreased urine and states pain to her lower back is worse with movement and improves slightly with rest.  She denies numbness or weakness of the LE's, incontinence, saddle anesthesia's, fever, vomiting or abdominal pain  Patient is a 51 y.o. female presenting with back pain. The history is provided by the patient.  Back Pain  This is a chronic problem. The current episode started more than 2 days ago. The problem occurs constantly. The problem has not changed since onset.The pain is associated with no known injury. The pain is present in the lumbar spine. The quality of the pain is described as aching and burning. The pain does not radiate. The pain is moderate. The symptoms are aggravated by bending, twisting and certain positions. Associated symptoms include dysuria. Pertinent negatives include no chest pain, no fever, no numbness, no abdominal pain, no abdominal swelling, no bowel incontinence, no perianal numbness, no bladder incontinence, no pelvic pain, no leg pain, no paresthesias, no paresis, no tingling and no weakness. She has tried analgesics for the symptoms. The treatment provided no relief.    Past Medical History  Diagnosis Date  . Hypertension   . Bipolar 1 disorder   . Depression   . Hypercholesteremia   . Acid reflux   . Chronic pain   . Stomach ulcer   . Headache   . Anxiety   . Crohn's disease   . Seizures     Past Surgical History  Procedure Date  . Cholecystectomy   . Tonsillectomy   . Stomach surgery   . Tubal ligation     Family History  Problem Relation  Age of Onset  . Hypertension Father   . Diabetes type II Brother   . Hypertension Brother   . Diabetes type II Mother     History  Substance Use Topics  . Smoking status: Never Smoker   . Smokeless tobacco: Not on file  . Alcohol Use: No    OB History    Grav Para Term Preterm Abortions TAB SAB Ect Mult Living                  Review of Systems  Constitutional: Negative for fever, activity change and appetite change.  Respiratory: Negative for shortness of breath.   Cardiovascular: Negative for chest pain.  Gastrointestinal: Negative for vomiting, abdominal pain, constipation and bowel incontinence.  Genitourinary: Positive for dysuria and decreased urine volume. Negative for bladder incontinence, frequency, hematuria, flank pain, vaginal bleeding, vaginal discharge, difficulty urinating and pelvic pain.       No perineal numbness or incontinence of urine or feces  Musculoskeletal: Positive for back pain. Negative for joint swelling.  Skin: Negative for rash.  Neurological: Negative for tingling, weakness, numbness and paresthesias.  All other systems reviewed and are negative.    Allergies  Compazine; Penicillins; Aspirin; Cephalexin; Ketorolac tromethamine; Metoclopramide hcl; Ondansetron hcl; Sumatriptan; and Tramadol  Home Medications   Current Outpatient Rx  Name  Route  Sig  Dispense  Refill  . ACETAMINOPHEN 500 MG PO TABS   Oral   Take 500-1,000 mg by  mouth daily as needed. pain         . ARIPIPRAZOLE 10 MG PO TABS   Oral   Take 10 mg by mouth at bedtime.          Marland Kitchen CLONIDINE HCL 0.1 MG PO TABS   Oral   Take 0.1 mg by mouth at bedtime.         Marland Kitchen DIAZEPAM 10 MG PO TABS   Oral   Take 10 mg by mouth 2 (two) times daily.         . DULOXETINE HCL 60 MG PO CPEP   Oral   Take 60 mg by mouth every morning.          Marland Kitchen HYDROCODONE-ACETAMINOPHEN 5-500 MG PO TABS   Oral   Take 1-2 tablets by mouth every 6 (six) hours as needed for pain.   15  tablet   0   . LABETALOL HCL 200 MG PO TABS   Oral   Take 200 mg by mouth 2 (two) times daily.         Marland Kitchen LEVETIRACETAM 500 MG PO TABS   Oral   Take 500-1,000 mg by mouth 2 (two) times daily. **Take one tablet daily in the morning and two tablets at bedtime**         . LUBIPROSTONE 8 MCG PO CAPS   Oral   Take 8 mcg by mouth 2 (two) times daily with a meal.          . PROMETHAZINE HCL 25 MG PO TABS   Oral   Take 1 tablet (25 mg total) by mouth every 6 (six) hours as needed for nausea.   12 tablet   0     BP 118/84  Pulse 95  Temp 97.8 F (36.6 C) (Oral)  Resp 18  Ht 5\' 4"  (1.626 m)  Wt 198 lb (89.812 kg)  BMI 33.99 kg/m2  SpO2 98%  Physical Exam  Nursing note and vitals reviewed. Constitutional: She is oriented to person, place, and time. She appears well-developed and well-nourished. No distress.  HENT:  Head: Normocephalic and atraumatic.  Neck: Normal range of motion. Neck supple.  Cardiovascular: Normal rate, regular rhythm, normal heart sounds and intact distal pulses.   No murmur heard. Pulmonary/Chest: Effort normal and breath sounds normal.  Musculoskeletal: She exhibits tenderness. She exhibits no edema.       Lumbar back: She exhibits tenderness and pain. She exhibits normal range of motion, no swelling, no deformity, no laceration and normal pulse.       ttp of the lumbar paraspinal muscles .  dp pulse are equal and brisk, distal sensation intact  Neurological: She is alert and oriented to person, place, and time. No sensory deficit. She exhibits normal muscle tone. Coordination and gait normal.  Reflex Scores:      Patellar reflexes are 2+ on the right side and 2+ on the left side.      Achilles reflexes are 2+ on the right side and 2+ on the left side. Skin: Skin is warm and dry.    ED Course  Procedures (including critical care time)  Labs Reviewed  URINALYSIS, ROUTINE W REFLEX MICROSCOPIC - Abnormal; Notable for the following:    Specific  Gravity, Urine >1.030 (*)     Ketones, ur TRACE (*)     All other components within normal limits     Urine was a cath urine sample   MDM    Previous ED notes were  reviewed by me patient is well known to this emergency department staff.  Patient has ttp of the right lumbar paraspinal muscles.  No focal neuro deficits on exam.  Ambulates with a slow but steady gait.     Mucus membranes are slightly dry.  Vital signs are stable no tachycardia or clinical signs of dehydration. Urinalysis results were discussed with the patient, she was encouraged to drink plan fluids.  Patient was reviewed on the [facility] narcotics database.  Prescribed:  Percocet #6     Luz Mares L. [place], [place] 11/02/12 2139

## 2012-11-04 NOTE — ED Provider Notes (Signed)
Medical screening examination/treatment/procedure(s) were performed by non-physician practitioner and as supervising physician I was immediately available for consultation/collaboration. Edwardo Wojnarowski, MD, FACEP   Alverda Nazzaro L Ellyse Rotolo, MD 11/04/12 2119 

## 2012-11-12 ENCOUNTER — Encounter (HOSPITAL_COMMUNITY): Payer: Self-pay | Admitting: Emergency Medicine

## 2012-11-12 ENCOUNTER — Emergency Department (HOSPITAL_COMMUNITY)
Admission: EM | Admit: 2012-11-12 | Discharge: 2012-11-12 | Disposition: A | Discharge status: Home / Self Care | Payer: Medicare Other | Attending: Emergency Medicine | Admitting: Emergency Medicine

## 2012-11-12 DIAGNOSIS — I10 Essential (primary) hypertension: Secondary | ICD-10-CM | POA: Insufficient documentation

## 2012-11-12 DIAGNOSIS — Z8659 Personal history of other mental and behavioral disorders: Secondary | ICD-10-CM | POA: Insufficient documentation

## 2012-11-12 DIAGNOSIS — E7800 Pure hypercholesterolemia, unspecified: Secondary | ICD-10-CM | POA: Insufficient documentation

## 2012-11-12 DIAGNOSIS — G40909 Epilepsy, unspecified, not intractable, without status epilepticus: Secondary | ICD-10-CM | POA: Insufficient documentation

## 2012-11-12 DIAGNOSIS — F319 Bipolar disorder, unspecified: Secondary | ICD-10-CM | POA: Insufficient documentation

## 2012-11-12 DIAGNOSIS — G8929 Other chronic pain: Secondary | ICD-10-CM | POA: Insufficient documentation

## 2012-11-12 DIAGNOSIS — M545 Low back pain, unspecified: Secondary | ICD-10-CM | POA: Insufficient documentation

## 2012-11-12 DIAGNOSIS — Z8679 Personal history of other diseases of the circulatory system: Secondary | ICD-10-CM | POA: Insufficient documentation

## 2012-11-12 DIAGNOSIS — Z8719 Personal history of other diseases of the digestive system: Secondary | ICD-10-CM | POA: Insufficient documentation

## 2012-11-12 DIAGNOSIS — K5090 Crohn's disease, unspecified, without complications: Secondary | ICD-10-CM | POA: Insufficient documentation

## 2012-11-12 DIAGNOSIS — M549 Dorsalgia, unspecified: Secondary | ICD-10-CM

## 2012-11-12 DIAGNOSIS — Z79899 Other long term (current) drug therapy: Secondary | ICD-10-CM | POA: Insufficient documentation

## 2012-11-12 LAB — URINALYSIS, ROUTINE W REFLEX MICROSCOPIC
Hgb urine dipstick: NEGATIVE
Leukocytes, UA: NEGATIVE
Protein, ur: NEGATIVE mg/dL
Specific Gravity, Urine: 1.025 (ref 1.005–1.030)
Urobilinogen, UA: 0.2 mg/dL (ref 0.0–1.0)

## 2012-11-12 MED ORDER — HYDROMORPHONE HCL PF 2 MG/ML IJ SOLN: 2.0000 mg | Freq: Once | INTRAMUSCULAR | Status: DC

## 2012-11-12 MED ORDER — CYCLOBENZAPRINE HCL 10 MG PO TABS: 10.0000 mg | ORAL_TABLET | Freq: Three times a day (TID) | ORAL | Status: DC | PRN

## 2012-11-12 NOTE — ED Notes (Signed)
Pt states she is unable to provide a urine sample, states she would rather have a catheter inserted.

## 2012-11-12 NOTE — ED Notes (Signed)
States she started having lower back pain 2 weeks ago, worse since yesterday.  States he was recently seen here for the same complaint and was told to follow up with her physician, however, states that her physician has discharged her and she no longer has one.

## 2012-11-12 NOTE — ED Provider Notes (Signed)
History     CSN: [id number]  Arrival date & time 11/12/12  0554   First MD Initiated Contact with Patient 11/12/12 [phone]      Chief Complaint  Patient presents with  . Back Pain    (Consider location/radiation/quality/duration/timing/severity/associated sxs/prior treatment) HPI Comments: Patient with history of chronic low back pain, chronic abd pain, chronic migraine headaches.  Presents today with pain in the lower back.  There is no injury or trauma.  No radiation into legs and no weakness of the lower extremities.  She denies urinary complaints.    Patient is a 51 y.o. female presenting with back pain. The history is provided by the patient.  Back Pain  This is a chronic problem. The problem occurs constantly. The problem has been rapidly worsening. The pain is associated with no known injury.    Past Medical History  Diagnosis Date  . Hypertension   . Bipolar 1 disorder   . Depression   . Hypercholesteremia   . Acid reflux   . Chronic pain   . Stomach ulcer   . Headache   . Anxiety   . Crohn's disease   . Seizures     Past Surgical History  Procedure Date  . Cholecystectomy   . Tonsillectomy   . Stomach surgery   . Tubal ligation     Family History  Problem Relation Age of Onset  . Hypertension Father   . Diabetes type II Brother   . Hypertension Brother   . Diabetes type II Mother     History  Substance Use Topics  . Smoking status: Never Smoker   . Smokeless tobacco: Not on file  . Alcohol Use: No    OB History    Grav Para Term Preterm Abortions TAB SAB Ect Mult Living                  Review of Systems  Musculoskeletal: Positive for back pain.  All other systems reviewed and are negative.    Allergies  Compazine; Penicillins; Aspirin; Cephalexin; Ketorolac tromethamine; Metoclopramide hcl; Ondansetron hcl; Sumatriptan; and Tramadol  Home Medications   Current Outpatient Rx  Name  Route  Sig  Dispense  Refill  . ACETAMINOPHEN 500  MG PO TABS   Oral   Take 500-1,000 mg by mouth daily as needed. pain         . ARIPIPRAZOLE 10 MG PO TABS   Oral   Take 10 mg by mouth at bedtime.          Marland Kitchen CLONIDINE HCL 0.1 MG PO TABS   Oral   Take 0.1 mg by mouth at bedtime.         . CYCLOBENZAPRINE HCL 10 MG PO TABS   Oral   Take 1 tablet (10 mg total) by mouth 3 (three) times daily as needed for muscle spasms.   21 tablet   0   . DIAZEPAM 10 MG PO TABS   Oral   Take 10 mg by mouth 2 (two) times daily.         . DULOXETINE HCL 60 MG PO CPEP   Oral   Take 60 mg by mouth every morning.          Marland Kitchen HYDROCODONE-ACETAMINOPHEN 5-500 MG PO TABS   Oral   Take 1-2 tablets by mouth every 6 (six) hours as needed for pain.   15 tablet   0   . LABETALOL HCL 200 MG PO TABS   Oral  Take 200 mg by mouth 2 (two) times daily.         Marland Kitchen LEVETIRACETAM 500 MG PO TABS   Oral   Take 500-1,000 mg by mouth 2 (two) times daily. **Take one tablet daily in the morning and two tablets at bedtime**         . LUBIPROSTONE 8 MCG PO CAPS   Oral   Take 8 mcg by mouth 2 (two) times daily with a meal.          . PROMETHAZINE HCL 25 MG PO TABS   Oral   Take 1 tablet (25 mg total) by mouth every 6 (six) hours as needed for nausea.   12 tablet   0     BP 198/124  Pulse 92  Temp 98.3 F (36.8 C) (Oral)  Resp 24  Ht 5\' 4"  (1.626 m)  Wt 200 lb (90.719 kg)  BMI 34.33 kg/m2  SpO2 98%  Physical Exam  Nursing note and vitals reviewed. Constitutional: She is oriented to person, place, and time. She appears well-developed and well-nourished. No distress.  HENT:  Head: Normocephalic and atraumatic.  Neck: Normal range of motion. Neck supple.  Cardiovascular: Normal rate and regular rhythm.  Exam reveals no gallop and no friction rub.   No murmur heard. Pulmonary/Chest: Effort normal and breath sounds normal. No respiratory distress. She has no wheezes.  Abdominal: Soft. Bowel sounds are normal. She exhibits no distension.  There is no tenderness.  Musculoskeletal: Normal range of motion.  Neurological: She is alert and oriented to person, place, and time. No cranial nerve deficit. She exhibits normal muscle tone. Coordination normal.       DTR's and 1+ and equal in the BLE.  Skin: Skin is warm and dry. She is not diaphoretic.    ED Course  Procedures (including critical care time)   Labs Reviewed  URINALYSIS, ROUTINE W REFLEX MICROSCOPIC   No results found.   No diagnosis found.    MDM  The patient has a history of chronic pain and frequents the ER for this.  She has the usual allergies of tramadol, toradol, and nsaids.  She originally informed us that she came here with her husband, however this turned out not to be true.  She tells [place] she doesn't have to urinate right now and wants cathed.  The ua was obtained and sent to the lab.  She will be discharged, to return prn.  She needs to obtain a pcp to discuss and manage her chronic pain.        Geoffery Lyons, MD 11/12/12 [phone]

## 2012-11-12 NOTE — ED Notes (Addendum)
Patient states she is driving herself home from the emergency department, MD made aware.

## 2012-11-24 ENCOUNTER — Encounter (HOSPITAL_COMMUNITY): Payer: Self-pay | Admitting: Emergency Medicine

## 2012-11-24 ENCOUNTER — Emergency Department (HOSPITAL_COMMUNITY)
Admission: EM | Admit: 2012-11-24 | Discharge: 2012-11-24 | Disposition: A | Discharge status: Home / Self Care | Payer: Medicare Other | Attending: Emergency Medicine | Admitting: Emergency Medicine

## 2012-11-24 DIAGNOSIS — M545 Low back pain, unspecified: Secondary | ICD-10-CM | POA: Insufficient documentation

## 2012-11-24 DIAGNOSIS — M549 Dorsalgia, unspecified: Secondary | ICD-10-CM

## 2012-11-24 DIAGNOSIS — G40909 Epilepsy, unspecified, not intractable, without status epilepticus: Secondary | ICD-10-CM | POA: Insufficient documentation

## 2012-11-24 DIAGNOSIS — F3289 Other specified depressive episodes: Secondary | ICD-10-CM | POA: Insufficient documentation

## 2012-11-24 DIAGNOSIS — E7800 Pure hypercholesterolemia, unspecified: Secondary | ICD-10-CM | POA: Insufficient documentation

## 2012-11-24 DIAGNOSIS — F319 Bipolar disorder, unspecified: Secondary | ICD-10-CM | POA: Insufficient documentation

## 2012-11-24 DIAGNOSIS — Z8719 Personal history of other diseases of the digestive system: Secondary | ICD-10-CM | POA: Insufficient documentation

## 2012-11-24 DIAGNOSIS — G8929 Other chronic pain: Secondary | ICD-10-CM | POA: Insufficient documentation

## 2012-11-24 DIAGNOSIS — F411 Generalized anxiety disorder: Secondary | ICD-10-CM | POA: Insufficient documentation

## 2012-11-24 DIAGNOSIS — F329 Major depressive disorder, single episode, unspecified: Secondary | ICD-10-CM | POA: Insufficient documentation

## 2012-11-24 DIAGNOSIS — Z79899 Other long term (current) drug therapy: Secondary | ICD-10-CM | POA: Insufficient documentation

## 2012-11-24 DIAGNOSIS — I10 Essential (primary) hypertension: Secondary | ICD-10-CM | POA: Insufficient documentation

## 2012-11-24 LAB — URINALYSIS, ROUTINE W REFLEX MICROSCOPIC
Glucose, UA: NEGATIVE mg/dL
Ketones, ur: NEGATIVE mg/dL
Leukocytes, UA: NEGATIVE
Nitrite: NEGATIVE
Protein, ur: NEGATIVE mg/dL
Urobilinogen, UA: 0.2 mg/dL (ref 0.0–1.0)

## 2012-11-24 MED ORDER — DIPHENHYDRAMINE HCL 50 MG/ML IJ SOLN
25.0000 mg | Freq: Once | INTRAMUSCULAR | Status: AC
  Administered 2012-11-24: 25 mg via INTRAMUSCULAR
  Filled 2012-11-24: qty 1

## 2012-11-24 MED ORDER — PROMETHAZINE HCL 25 MG PO TABS: 25.0000 mg | ORAL_TABLET | Freq: Four times a day (QID) | ORAL | Status: DC | PRN

## 2012-11-24 MED ORDER — PROMETHAZINE HCL 25 MG/ML IJ SOLN
25.0000 mg | Freq: Once | INTRAMUSCULAR | Status: AC
  Administered 2012-11-24: 25 mg via INTRAMUSCULAR
  Filled 2012-11-24: qty 1

## 2012-11-24 MED ORDER — KETOROLAC TROMETHAMINE 60 MG/2ML IM SOLN
60.0000 mg | Freq: Once | INTRAMUSCULAR | Status: AC
  Administered 2012-11-24: 60 mg via INTRAMUSCULAR
  Filled 2012-11-24: qty 2

## 2012-11-24 MED ORDER — CYCLOBENZAPRINE HCL 10 MG PO TABS: 10.0000 mg | ORAL_TABLET | Freq: Two times a day (BID) | ORAL | Status: DC | PRN

## 2012-11-24 NOTE — ED Notes (Signed)
Pt requesting pain medication.  

## 2012-11-24 NOTE — ED Notes (Signed)
Discharge instructions reviewed with pt, questions answered. Pt verbalized understanding.  

## 2012-11-24 NOTE — ED Provider Notes (Signed)
History     CSN: [id number]  Arrival date & time 11/24/12  [name]   First MD Initiated Contact with Patient 11/24/12 2119      Chief Complaint  Patient presents with  . Back Pain    (Consider location/radiation/quality/duration/timing/severity/associated sxs/prior treatment) HPI Comments: Patient comes to the ER for evaluation of low back pain. Patient reports that she has a history of chronic back pain but in the last week or so the pain is worsened. She was seen here in the ER and thinks that she might have a urinary infection at that time. Urine has been dark. Patient denies any injury. Pain is sharp, stabbing in the lower back. She says it is severe it worsens when she moves.  Patient is a 51 y.o. female presenting with back pain.  Back Pain  Pertinent negatives include no dysuria.    Past Medical History  Diagnosis Date  . Hypertension   . Bipolar 1 disorder   . Depression   . Hypercholesteremia   . Acid reflux   . Chronic pain   . Stomach ulcer   . Headache   . Anxiety   . Crohn's disease   . Seizures     Past Surgical History  Procedure Date  . Cholecystectomy   . Tonsillectomy   . Stomach surgery   . Tubal ligation     Family History  Problem Relation Age of Onset  . Hypertension Father   . Diabetes type II Brother   . Hypertension Brother   . Diabetes type II Mother     History  Substance Use Topics  . Smoking status: Never Smoker   . Smokeless tobacco: Not on file  . Alcohol Use: No    OB History    Grav Para Term Preterm Abortions TAB SAB Ect Mult Living                  Review of Systems  Genitourinary: Negative for dysuria.  Musculoskeletal: Positive for back pain.  Neurological: Negative.     Allergies  Compazine; Penicillins; Aspirin; Cephalexin; Ketorolac tromethamine; Metoclopramide hcl; Ondansetron hcl; Sumatriptan; and Tramadol  Home Medications   Current Outpatient Rx  Name  Route  Sig  Dispense  Refill  . ACETAMINOPHEN  500 MG PO TABS   Oral   Take 500-1,000 mg by mouth daily as needed. pain         . ARIPIPRAZOLE 10 MG PO TABS   Oral   Take 10 mg by mouth at bedtime.          Marland Kitchen CLONIDINE HCL 0.1 MG PO TABS   Oral   Take 0.1 mg by mouth at bedtime.         . CYCLOBENZAPRINE HCL 10 MG PO TABS   Oral   Take 1 tablet (10 mg total) by mouth 3 (three) times daily as needed for muscle spasms.   21 tablet   0   . CYCLOBENZAPRINE HCL 10 MG PO TABS   Oral   Take 1 tablet (10 mg total) by mouth 3 (three) times daily as needed for muscle spasms.   20 tablet   0   . DIAZEPAM 10 MG PO TABS   Oral   Take 10 mg by mouth 2 (two) times daily.         . DULOXETINE HCL 60 MG PO CPEP   Oral   Take 60 mg by mouth every morning.          Marland Kitchen  HYDROCODONE-ACETAMINOPHEN 5-500 MG PO TABS   Oral   Take 1-2 tablets by mouth every 6 (six) hours as needed for pain.   15 tablet   0   . LABETALOL HCL 200 MG PO TABS   Oral   Take 200 mg by mouth 2 (two) times daily.         Marland Kitchen LEVETIRACETAM 500 MG PO TABS   Oral   Take 500-1,000 mg by mouth 2 (two) times daily. **Take one tablet daily in the morning and two tablets at bedtime**         . LUBIPROSTONE 8 MCG PO CAPS   Oral   Take 8 mcg by mouth 2 (two) times daily with a meal.          . PROMETHAZINE HCL 25 MG PO TABS   Oral   Take 1 tablet (25 mg total) by mouth every 6 (six) hours as needed for nausea.   12 tablet   0     BP 183/112  Pulse 107  Temp 97.4 F (36.3 C) (Oral)  Resp 20  Ht 5' 4.5" (1.638 m)  Wt 200 lb (90.719 kg)  BMI 33.80 kg/m2  SpO2 100%  Physical Exam  Constitutional: She is oriented to person, place, and time. She appears distressed.  HENT:  Head: Normocephalic.  Neck: Normal range of motion.  Cardiovascular: Normal rate, regular rhythm, normal heart sounds and intact distal pulses.   Pulmonary/Chest: Breath sounds normal. No respiratory distress. She has no wheezes. She has no rales. She exhibits no  tenderness.  Abdominal: Soft.  Musculoskeletal: She exhibits no edema.       Lumbar back: She exhibits tenderness.  Neurological: She is alert and oriented to person, place, and time. She has normal strength and normal reflexes. She displays no atrophy. No cranial nerve deficit or sensory deficit. She exhibits normal muscle tone. Coordination normal.  Reflex Scores:      Patellar reflexes are 2+ on the right side and 2+ on the left side.   ED Course  Procedures (including critical care time)   Labs Reviewed  URINALYSIS, ROUTINE W REFLEX MICROSCOPIC   No results found.   No diagnosis found.    MDM  Patient presents with nontraumatic low back pain. Review of the records reveals that she has been here multiple times with this complaint before. Although she appears to be somewhat distressed by the pain, examination is unremarkable. She has normal strength, sensation and reflexes in lower extremities. Patient says that she would like Demerol and something for nausea. The fact that she is asking for specific pain medication and has multiple visits is concerning behavior. Patient also has multiple pain med allergies that seem to dictate narcotic analgesia. She did, however, admit that her allergy to NSAIDS is GI upset, not a true allergy.        Gilda Crease, MD 11/24/12 2200

## 2012-11-24 NOTE — ED Notes (Signed)
Patient complaining of back pain across entire lower back for approximately a week. Seen recently for same.

## 2012-12-05 ENCOUNTER — Encounter (HOSPITAL_COMMUNITY): Payer: Self-pay | Admitting: Emergency Medicine

## 2012-12-05 ENCOUNTER — Emergency Department (HOSPITAL_COMMUNITY): Payer: Medicare Other

## 2012-12-05 ENCOUNTER — Emergency Department (HOSPITAL_COMMUNITY)
Admission: EM | Admit: 2012-12-05 | Discharge: 2012-12-05 | Disposition: A | Discharge status: Home / Self Care | Payer: Medicare Other | Attending: Emergency Medicine | Admitting: Emergency Medicine

## 2012-12-05 DIAGNOSIS — K219 Gastro-esophageal reflux disease without esophagitis: Secondary | ICD-10-CM | POA: Insufficient documentation

## 2012-12-05 DIAGNOSIS — F319 Bipolar disorder, unspecified: Secondary | ICD-10-CM | POA: Insufficient documentation

## 2012-12-05 DIAGNOSIS — R112 Nausea with vomiting, unspecified: Secondary | ICD-10-CM | POA: Insufficient documentation

## 2012-12-05 DIAGNOSIS — G40909 Epilepsy, unspecified, not intractable, without status epilepticus: Secondary | ICD-10-CM | POA: Insufficient documentation

## 2012-12-05 DIAGNOSIS — I10 Essential (primary) hypertension: Secondary | ICD-10-CM | POA: Insufficient documentation

## 2012-12-05 DIAGNOSIS — Z8719 Personal history of other diseases of the digestive system: Secondary | ICD-10-CM | POA: Insufficient documentation

## 2012-12-05 DIAGNOSIS — K5090 Crohn's disease, unspecified, without complications: Secondary | ICD-10-CM | POA: Insufficient documentation

## 2012-12-05 DIAGNOSIS — Z79899 Other long term (current) drug therapy: Secondary | ICD-10-CM | POA: Insufficient documentation

## 2012-12-05 DIAGNOSIS — Z8679 Personal history of other diseases of the circulatory system: Secondary | ICD-10-CM | POA: Insufficient documentation

## 2012-12-05 DIAGNOSIS — F411 Generalized anxiety disorder: Secondary | ICD-10-CM | POA: Insufficient documentation

## 2012-12-05 DIAGNOSIS — N390 Urinary tract infection, site not specified: Secondary | ICD-10-CM | POA: Insufficient documentation

## 2012-12-05 DIAGNOSIS — R109 Unspecified abdominal pain: Secondary | ICD-10-CM | POA: Insufficient documentation

## 2012-12-05 DIAGNOSIS — E7800 Pure hypercholesterolemia, unspecified: Secondary | ICD-10-CM | POA: Insufficient documentation

## 2012-12-05 DIAGNOSIS — R197 Diarrhea, unspecified: Secondary | ICD-10-CM | POA: Insufficient documentation

## 2012-12-05 DIAGNOSIS — F329 Major depressive disorder, single episode, unspecified: Secondary | ICD-10-CM | POA: Insufficient documentation

## 2012-12-05 DIAGNOSIS — G8929 Other chronic pain: Secondary | ICD-10-CM | POA: Insufficient documentation

## 2012-12-05 DIAGNOSIS — F3289 Other specified depressive episodes: Secondary | ICD-10-CM | POA: Insufficient documentation

## 2012-12-05 DIAGNOSIS — E876 Hypokalemia: Secondary | ICD-10-CM | POA: Insufficient documentation

## 2012-12-05 LAB — COMPREHENSIVE METABOLIC PANEL
Albumin: 3.7 g/dL (ref 3.5–5.2)
BUN: 7 mg/dL (ref 6–23)
Calcium: 9.4 mg/dL (ref 8.4–10.5)
Chloride: 98 mEq/L (ref 96–112)
Creatinine, Ser: 0.83 mg/dL (ref 0.50–1.10)
GFR calc non Af Amer: 79 mL/min — ABNORMAL LOW (ref >90)
Total Bilirubin: 0.2 mg/dL — ABNORMAL LOW (ref 0.3–1.2)

## 2012-12-05 LAB — CBC
HCT: 32.1 % — ABNORMAL LOW (ref 36.0–46.0)
MCH: 26.5 pg (ref 26.0–34.0)
MCHC: 33.3 g/dL (ref 30.0–36.0)
MCV: 79.5 fL (ref 78.0–100.0)
Platelets: 261 10*3/uL (ref 150–400)
RDW: 15.3 % (ref 11.5–15.5)
WBC: 9.5 10*3/uL (ref 4.0–10.5)

## 2012-12-05 LAB — URINALYSIS, ROUTINE W REFLEX MICROSCOPIC
Bilirubin Urine: NEGATIVE
Ketones, ur: NEGATIVE mg/dL
Nitrite: NEGATIVE
Protein, ur: NEGATIVE mg/dL
Urobilinogen, UA: 0.2 mg/dL (ref 0.0–1.0)

## 2012-12-05 MED ORDER — MORPHINE SULFATE 2 MG/ML IJ SOLN: 2.0000 mg | Freq: Once | INTRAMUSCULAR | Status: DC

## 2012-12-05 MED ORDER — NITROFURANTOIN MACROCRYSTAL 100 MG PO CAPS: 100.0000 mg | ORAL_CAPSULE | Freq: Two times a day (BID) | ORAL | Status: DC

## 2012-12-05 MED ORDER — SODIUM CHLORIDE 0.9 % IV BOLUS (SEPSIS)
1000.0000 mL | Freq: Once | INTRAVENOUS | Status: AC
  Administered 2012-12-05: 1000 mL via INTRAVENOUS

## 2012-12-05 MED ORDER — PROMETHAZINE HCL 25 MG PO TABS: 12.5000 mg | ORAL_TABLET | Freq: Four times a day (QID) | ORAL | Status: AC | PRN

## 2012-12-05 MED ORDER — MORPHINE SULFATE 4 MG/ML IJ SOLN
INTRAMUSCULAR | Status: AC
  Administered 2012-12-05: 2 mg
  Filled 2012-12-05: qty 1

## 2012-12-05 MED ORDER — POTASSIUM CHLORIDE CRYS ER 20 MEQ PO TBCR: 20.0000 meq | EXTENDED_RELEASE_TABLET | Freq: Two times a day (BID) | ORAL | Status: DC

## 2012-12-05 MED ORDER — MORPHINE SULFATE 4 MG/ML IJ SOLN
2.0000 mg | Freq: Once | INTRAMUSCULAR | Status: AC
  Administered 2012-12-05: 2 mg via INTRAVENOUS
  Filled 2012-12-05: qty 1

## 2012-12-05 MED ORDER — ONDANSETRON HCL 4 MG/2ML IJ SOLN
4.0000 mg | Freq: Once | INTRAMUSCULAR | Status: AC
  Administered 2012-12-05: 4 mg via INTRAVENOUS
  Filled 2012-12-05: qty 2

## 2012-12-05 NOTE — ED Notes (Signed)
Patient c/o suprapubic abdominal pain x 2 days.  Patient also c/o nausea and diarrhea.  States pain is crampy in nature.

## 2012-12-05 NOTE — ED Provider Notes (Signed)
History     CSN: [id number]  Arrival date & time 12/05/12  0010   First MD Initiated Contact with Patient 12/05/12 0025      Chief Complaint  Patient presents with  . Abdominal Pain  . Nausea  . Diarrhea    (Consider location/radiation/quality/duration/timing/severity/associated sxs/prior treatment) HPI [name] is a 52 y.o. female with a h/o Crohns disease, PUD, reflux,  who presents to the Emergency Department complaining of abdominal pain x 2 days located primarily in the middle of the abdomen. Pain is intermittent sharp and dull associated with mild nausea, several episodes of vomiting and several episodes of diarrhea. No blood in the emesis or stool. She has taken her regular medicines with no improvement.   Past Medical History  Diagnosis Date  . Hypertension   . Bipolar 1 disorder   . Depression   . Hypercholesteremia   . Acid reflux   . Chronic pain   . Stomach ulcer   . Headache   . Anxiety   . Crohn's disease   . Seizures     Past Surgical History  Procedure Date  . Cholecystectomy   . Tonsillectomy   . Stomach surgery   . Tubal ligation     Family History  Problem Relation Age of Onset  . Hypertension Father   . Diabetes type II Brother   . Hypertension Brother   . Diabetes type II Mother     History  Substance Use Topics  . Smoking status: Never Smoker   . Smokeless tobacco: Not on file  . Alcohol Use: No    OB History    Grav Para Term Preterm Abortions TAB SAB Ect Mult Living                  Review of Systems  Constitutional: Negative for fever.       10 Systems reviewed and are negative for acute change except as noted in the HPI.  HENT: Negative for congestion.   Eyes: Negative for discharge and redness.  Respiratory: Negative for cough and shortness of breath.   Cardiovascular: Negative for chest pain.  Gastrointestinal: Positive for nausea, vomiting and abdominal pain.  Musculoskeletal: Negative for back pain.  Skin:  Negative for rash.  Neurological: Negative for syncope, numbness and headaches.  Psychiatric/Behavioral:       No behavior change.    Allergies  Compazine; Penicillins; Aspirin; Cephalexin; Ketorolac tromethamine; Metoclopramide hcl; Ondansetron hcl; Sumatriptan; and Tramadol  Home Medications   Current Outpatient Rx  Name  Route  Sig  Dispense  Refill  . ACETAMINOPHEN 500 MG PO TABS   Oral   Take 500-1,000 mg by mouth daily as needed. pain         . ARIPIPRAZOLE 10 MG PO TABS   Oral   Take 10 mg by mouth at bedtime.          Marland Kitchen CLONIDINE HCL 0.1 MG PO TABS   Oral   Take 0.1 mg by mouth 2 (two) times daily.          . DULOXETINE HCL 60 MG PO CPEP   Oral   Take 60 mg by mouth every morning.          Marland Kitchen LABETALOL HCL 200 MG PO TABS   Oral   Take 200 mg by mouth 2 (two) times daily.         Marland Kitchen LEVETIRACETAM 500 MG PO TABS   Oral   Take 500-1,000 mg by  mouth 2 (two) times daily. **Take one tablet daily in the morning and two tablets at bedtime**         . LUBIPROSTONE 8 MCG PO CAPS   Oral   Take 8 mcg by mouth 2 (two) times daily with a meal.          . CYCLOBENZAPRINE HCL 10 MG PO TABS   Oral   Take 1 tablet (10 mg total) by mouth 2 (two) times daily as needed for muscle spasms.   20 tablet   0   . DIAZEPAM 10 MG PO TABS   Oral   Take 10 mg by mouth 2 (two) times daily.         Marland Kitchen PROMETHAZINE HCL 25 MG PO TABS   Oral   Take 1 tablet (25 mg total) by mouth every 6 (six) hours as needed for nausea.   30 tablet   0     BP 167/117  Pulse 104  Temp 98.3 F (36.8 C) (Oral)  Resp 20  SpO2 100%  Physical Exam  Nursing note and vitals reviewed. Constitutional: She appears well-developed and well-nourished.       Awake, alert, nontoxic appearance.  HENT:  Head: Atraumatic.  Eyes: Right eye exhibits no discharge. Left eye exhibits no discharge.  Neck: Neck supple.  Cardiovascular: Normal heart sounds.   Pulmonary/Chest: Effort normal and  breath sounds normal. She exhibits no tenderness.  Abdominal: Soft. Bowel sounds are normal. There is tenderness. There is no rebound.       Mild tenderness to palpation across the middle abdomen.  Musculoskeletal: She exhibits no tenderness.       Baseline ROM, no obvious new focal weakness.  Neurological:       Mental status and motor strength appears baseline for patient and situation.  Skin: No rash noted.  Psychiatric: She has a normal mood and affect.    ED Course  Procedures (including critical care time)  Results for orders placed during the hospital encounter of 12/05/12  URINALYSIS, ROUTINE W REFLEX MICROSCOPIC      Component Value Range   Color, Urine YELLOW  YELLOW   APPearance CLEAR  CLEAR   Specific Gravity, Urine 1.010  1.005 - 1.030   pH 8.0  5.0 - 8.0   Glucose, UA NEGATIVE  NEGATIVE mg/dL   Hgb urine dipstick NEGATIVE  NEGATIVE   Bilirubin Urine NEGATIVE  NEGATIVE   Ketones, ur NEGATIVE  NEGATIVE mg/dL   Protein, ur NEGATIVE  NEGATIVE mg/dL   Urobilinogen, UA 0.2  0.0 - 1.0 mg/dL   Nitrite NEGATIVE  NEGATIVE   Leukocytes, UA TRACE (*) NEGATIVE  URINE MICROSCOPIC-ADD ON      Component Value Range   Squamous Epithelial / LPF FEW (*) RARE   WBC, UA 0-2  <3 WBC/hpf  CBC      Component Value Range   WBC 9.5  4.0 - 10.5 K/uL   RBC 4.04  3.87 - 5.11 MIL/uL   Hemoglobin 10.7 (*) 12.0 - 15.0 g/dL   HCT 16.1 (*) 09.6 - 04.5 %   MCV 79.5  78.0 - 100.0 fL   MCH 26.5  26.0 - 34.0 pg   MCHC 33.3  30.0 - 36.0 g/dL   RDW 40.9  81.1 - 91.4 %   Platelets 261  150 - 400 K/uL  COMPREHENSIVE METABOLIC PANEL      Component Value Range   Sodium 138  135 - 145 mEq/L   Potassium 3.3 (*)  3.5 - 5.1 mEq/L   Chloride 98  96 - 112 mEq/L   CO2 32  19 - 32 mEq/L   Glucose, Bld 105 (*) 70 - 99 mg/dL   BUN 7  6 - 23 mg/dL   Creatinine, Ser 1.61  0.50 - 1.10 mg/dL   Calcium 9.4  8.4 - 09.6 mg/dL   Total Protein 7.2  6.0 - 8.3 g/dL   Albumin 3.7  3.5 - 5.2 g/dL   AST 20  0 - 37  U/L   ALT 23  0 - 35 U/L   Alkaline Phosphatase 90  39 - 117 U/L   Total Bilirubin 0.2 (*) 0.3 - 1.2 mg/dL   GFR calc non Af Amer 79 (*) >90 mL/min   GFR calc Af Amer >90  >90 mL/min    Dg Abd Acute W/chest  12/05/2012  *RADIOLOGY REPORT*  Clinical Data: Abdominal pain and nausea.  ACUTE ABDOMEN SERIES (ABDOMEN 2 VIEW & CHEST 1 VIEW)  Comparison: Chest and two views abdomen 10/14/2012 and CT abdomen and pelvis 10/04/2012.  Findings: Single view of the chest demonstrates mild linear atelectasis or scar in the left lower lung zone.  Lungs otherwise clear.  No pneumothorax or pleural fluid.  Heart size normal.  Two views the abdomen show no free intraperitoneal air.  There is no evidence of bowel obstruction.  Multiple surgical clips in the abdomen noted.  IMPRESSION: No acute finding chest or abdomen.   Original Report Authenticated By: Holley Dexter, M.D.     MDM  Patient presents with abdominal pain associated with nausea, vomiting and diarrhea. Gien IVF, antiemetic and analgesic with relief.Labs unremarkable except for a slightly low potassium and urine with trace LE and few bacteria.Will send patient home with nausea medicine, antibiotic for UTI and potassium. Reviewed results with patient. She has been able to take PO fluids. Pt feels improved after observation and/or treatment in ED.Pt stable in ED with no significant deterioration in condition.The patient appears reasonably screened and/or stabilized for discharge and I doubt any other medical condition or other [facility] requiring further screening, evaluation, or treatment in the ED at this time prior to discharge.  MDM Reviewed: nursing note and vitals Interpretation: labs and x-ray           Nicoletta Dress. Colon Branch, MD 12/05/12 0454

## 2012-12-16 ENCOUNTER — Emergency Department (HOSPITAL_COMMUNITY)
Admission: EM | Admit: 2012-12-16 | Discharge: 2012-12-16 | Disposition: A | Discharge status: Home / Self Care | Payer: Medicare Other | Attending: Emergency Medicine | Admitting: Emergency Medicine

## 2012-12-16 ENCOUNTER — Encounter (HOSPITAL_COMMUNITY): Payer: Self-pay | Admitting: Emergency Medicine

## 2012-12-16 DIAGNOSIS — Z79899 Other long term (current) drug therapy: Secondary | ICD-10-CM | POA: Insufficient documentation

## 2012-12-16 DIAGNOSIS — G8929 Other chronic pain: Secondary | ICD-10-CM | POA: Insufficient documentation

## 2012-12-16 DIAGNOSIS — F319 Bipolar disorder, unspecified: Secondary | ICD-10-CM | POA: Insufficient documentation

## 2012-12-16 DIAGNOSIS — Z8711 Personal history of peptic ulcer disease: Secondary | ICD-10-CM | POA: Insufficient documentation

## 2012-12-16 DIAGNOSIS — K5090 Crohn's disease, unspecified, without complications: Secondary | ICD-10-CM | POA: Insufficient documentation

## 2012-12-16 DIAGNOSIS — E7800 Pure hypercholesterolemia, unspecified: Secondary | ICD-10-CM | POA: Insufficient documentation

## 2012-12-16 DIAGNOSIS — Z8659 Personal history of other mental and behavioral disorders: Secondary | ICD-10-CM | POA: Insufficient documentation

## 2012-12-16 DIAGNOSIS — Z8679 Personal history of other diseases of the circulatory system: Secondary | ICD-10-CM | POA: Insufficient documentation

## 2012-12-16 DIAGNOSIS — G40909 Epilepsy, unspecified, not intractable, without status epilepticus: Secondary | ICD-10-CM | POA: Insufficient documentation

## 2012-12-16 DIAGNOSIS — R109 Unspecified abdominal pain: Secondary | ICD-10-CM | POA: Insufficient documentation

## 2012-12-16 DIAGNOSIS — R110 Nausea: Secondary | ICD-10-CM | POA: Insufficient documentation

## 2012-12-16 DIAGNOSIS — I10 Essential (primary) hypertension: Secondary | ICD-10-CM | POA: Insufficient documentation

## 2012-12-16 DIAGNOSIS — Z8719 Personal history of other diseases of the digestive system: Secondary | ICD-10-CM | POA: Insufficient documentation

## 2012-12-16 LAB — COMPREHENSIVE METABOLIC PANEL
ALT: 19 U/L (ref 0–35)
AST: 22 U/L (ref 0–37)
Alkaline Phosphatase: 94 U/L (ref 39–117)
CO2: 23 mEq/L (ref 19–32)
Chloride: 104 mEq/L (ref 96–112)
GFR calc non Af Amer: >90 mL/min (ref >90)
Potassium: 3.6 mEq/L (ref 3.5–5.1)
Sodium: 139 mEq/L (ref 135–145)
Total Bilirubin: 0.2 mg/dL — ABNORMAL LOW (ref 0.3–1.2)

## 2012-12-16 LAB — URINALYSIS, ROUTINE W REFLEX MICROSCOPIC
Glucose, UA: NEGATIVE mg/dL
Leukocytes, UA: NEGATIVE
Protein, ur: NEGATIVE mg/dL
Specific Gravity, Urine: 1.025 (ref 1.005–1.030)
Urobilinogen, UA: 0.2 mg/dL (ref 0.0–1.0)

## 2012-12-16 LAB — CBC WITH DIFFERENTIAL/PLATELET
Basophils Absolute: 0 10*3/uL (ref 0.0–0.1)
Lymphocytes Relative: 31 % (ref 12–46)
Neutro Abs: 4.9 10*3/uL (ref 1.7–7.7)
Platelets: 275 10*3/uL (ref 150–400)
RBC: 4.46 MIL/uL (ref 3.87–5.11)
RDW: 15.4 % (ref 11.5–15.5)
WBC: 8 10*3/uL (ref 4.0–10.5)

## 2012-12-16 MED ORDER — SODIUM CHLORIDE 0.9 % IV BOLUS (SEPSIS): 1000.0000 mL | Freq: Once | INTRAVENOUS | Status: DC

## 2012-12-16 MED ORDER — HYDROMORPHONE HCL PF 1 MG/ML IJ SOLN: 1.0000 mg | Freq: Once | INTRAMUSCULAR | Status: DC

## 2012-12-16 MED ORDER — PROMETHAZINE HCL 25 MG/ML IJ SOLN
25.0000 mg | Freq: Once | INTRAMUSCULAR | Status: AC
  Administered 2012-12-16: 25 mg via INTRAMUSCULAR
  Filled 2012-12-16: qty 1

## 2012-12-16 MED ORDER — HYDROMORPHONE HCL PF 1 MG/ML IJ SOLN
1.0000 mg | Freq: Once | INTRAMUSCULAR | Status: AC
  Administered 2012-12-16: 1 mg via INTRAMUSCULAR
  Filled 2012-12-16: qty 1

## 2012-12-16 MED ORDER — SODIUM CHLORIDE 0.9 % IV SOLN: Freq: Once | INTRAVENOUS | Status: DC

## 2012-12-16 MED ORDER — PROMETHAZINE HCL 25 MG/ML IJ SOLN: 25.0000 mg | Freq: Once | INTRAMUSCULAR | Status: DC

## 2012-12-16 NOTE — ED Provider Notes (Signed)
Medical screening examination/treatment/procedure(s) were performed by non-physician practitioner and as supervising physician I was immediately available for consultation/collaboration. Devoria Albe, MD, Armando Gang   Ward Givens, MD 12/16/12 [phone]

## 2012-12-16 NOTE — Discharge Instructions (Signed)
Abdominal Pain  Abdominal pain can be caused by many things. Your caregiver decides the seriousness of your pain by an examination and possibly blood tests and X-rays. Many cases can be observed and treated at home. Most abdominal pain is not caused by a disease and will probably improve without treatment. However, in many cases, more time must pass before a clear cause of the pain can be found. Before that point, it may not be known if you need more testing, or if hospitalization or surgery is needed.  HOME CARE INSTRUCTIONS   · Do not take laxatives unless directed by your caregiver.  · Take pain medicine only as directed by your caregiver.  · Only take over-the-counter or prescription medicines for pain, discomfort, or fever as directed by your caregiver.  · Try a clear liquid diet (broth, tea, or water) for as long as directed by your caregiver. Slowly move to a bland diet as tolerated.  SEEK IMMEDIATE MEDICAL CARE IF:   · The pain does not go away.  · You have a fever.  · You keep throwing up (vomiting).  · The pain is felt only in portions of the abdomen. Pain in the right side could possibly be appendicitis. In an adult, pain in the left lower portion of the abdomen could be colitis or diverticulitis.  · You pass bloody or black tarry stools.  MAKE SURE YOU:   · Understand these instructions.  · Will watch your condition.  · Will get help right away if you are not doing well or get worse.  Document Released: 08/26/2005 Document Revised: 02/08/2012 Document Reviewed: 07/04/2008  [employer]® Patient Information ©2013 [employer].

## 2012-12-16 NOTE — ED Notes (Signed)
Pt provided with saltine crackers and sprite for po trial

## 2012-12-16 NOTE — ED Provider Notes (Addendum)
History     CSN: [id number]  Arrival date & time 12/16/12  1029   First MD Initiated Contact with Patient 12/16/12 1103      Chief Complaint  Patient presents with  . Abdominal Pain  . Nausea    (Consider location/radiation/quality/duration/timing/severity/associated sxs/prior treatment) HPI Comments: 52 year old female with history of chronic pain, Crohn's disease and acid reflux presents to the emergency department complaining of abdominal pain and nausea worsening over the past couple days. States she vomited once 2 days ago. Pain located in the Center of her abdomen and is nonradiating, rated 10 out of 10. She has tried taking Tylenol without relief. Denies fever, chills, urinary or bowel changes, vaginal bleeding or discharge. She does have a decreased appetite. She has a history of cholecystectomy and tubal ligation. Patient states she is "a hard stick" and if they need to "stick her foot" to get an IV started and blood, that is okay "as long as she can get something for pain".  Patient is a 52 y.o. female presenting with abdominal pain. The history is provided by the patient.  Abdominal Pain The primary symptoms of the illness include abdominal pain.    Past Medical History  Diagnosis Date  . Hypertension   . Bipolar 1 disorder   . Depression   . Hypercholesteremia   . Acid reflux   . Chronic pain   . Stomach ulcer   . Headache   . Anxiety   . Crohn's disease   . Seizures     Past Surgical History  Procedure Date  . Cholecystectomy   . Tonsillectomy   . Stomach surgery   . Tubal ligation     Family History  Problem Relation Age of Onset  . Hypertension Father   . Diabetes type II Brother   . Hypertension Brother   . Diabetes type II Mother     History  Substance Use Topics  . Smoking status: Never Smoker   . Smokeless tobacco: Not on file  . Alcohol Use: No    OB History    Grav Para Term Preterm Abortions TAB SAB Ect Mult Living                   Review of Systems  Gastrointestinal: Positive for abdominal pain.    Allergies  Compazine; Penicillins; Aspirin; Cephalexin; Ketorolac tromethamine; Metoclopramide hcl; Ondansetron hcl; Sumatriptan; and Tramadol  Home Medications   Current Outpatient Rx  Name  Route  Sig  Dispense  Refill  . ACETAMINOPHEN 500 MG PO TABS   Oral   Take 500-1,000 mg by mouth daily as needed. pain         . ARIPIPRAZOLE 10 MG PO TABS   Oral   Take 10 mg by mouth at bedtime.          Marland Kitchen CLONIDINE HCL 0.1 MG PO TABS   Oral   Take 0.1 mg by mouth 2 (two) times daily.          . DULOXETINE HCL 60 MG PO CPEP   Oral   Take 60 mg by mouth every morning.          Marland Kitchen LABETALOL HCL 200 MG PO TABS   Oral   Take 200 mg by mouth 2 (two) times daily.         Marland Kitchen LEVETIRACETAM 500 MG PO TABS   Oral   Take 500-1,000 mg by mouth 2 (two) times daily. **Take one tablet daily in the morning  and two tablets at bedtime**         . LUBIPROSTONE 8 MCG PO CAPS   Oral   Take 8 mcg by mouth 2 (two) times daily with a meal.            BP 133/84  Pulse 85  Temp 98.2 F (36.8 C) (Oral)  Resp 20  Ht 5' 4.5" (1.638 m)  Wt 195 lb (88.451 kg)  BMI 32.95 kg/m2  Physical Exam  Nursing note and vitals reviewed. Constitutional: She is oriented to person, place, and time. She appears well-developed and well-nourished. No distress.  HENT:  Head: Normocephalic and atraumatic.  Mouth/Throat: Oropharynx is clear and moist.  Eyes: Conjunctivae normal and EOM are normal. Pupils are equal, round, and reactive to light. No scleral icterus.  Neck: Normal range of motion. Neck supple.  Cardiovascular: Normal rate, regular rhythm, normal heart sounds and intact distal pulses.   Pulmonary/Chest: Effort normal and breath sounds normal. She has no wheezes. She has no rales.  Abdominal: Soft. Normal appearance and bowel sounds are normal. She exhibits no mass. There is generalized tenderness. There is guarding.  There is no rigidity and no rebound.       No peritoneal signs.  Musculoskeletal: Normal range of motion. She exhibits no edema.  Neurological: She is alert and oriented to person, place, and time.  Skin: Skin is warm and dry.  Psychiatric: She has a normal mood and affect. Her speech is normal. She is is hyperactive.    ED Course  Procedures (including critical care time)  Labs Reviewed  CBC WITH DIFFERENTIAL - Abnormal; Notable for the following:    Hemoglobin 11.9 (*)     HCT 35.4 (*)     All other components within normal limits  COMPREHENSIVE METABOLIC PANEL - Abnormal; Notable for the following:    Glucose, Bld 132 (*)     Total Bilirubin 0.2 (*)     All other components within normal limits  URINALYSIS, ROUTINE W REFLEX MICROSCOPIC  LIPASE, BLOOD   No results found.   1. Abdominal pain       MDM  52 year old female with abdominal pain and nausea. She immediately was requesting pain medication. I told him a controlled substance database, she is prescribe pain medication very often. He did give her pain medication in the emergency department. Labs unremarkable. She is able to eating drink without difficulty at this time. Abdominal tenderness exam has decreased. When I told her she would be discharged, she immediately asked to be sent home with some pain medication. I told her that she is prescribe pain medication very often and I cannot do this. She was agreeable. Return precautions discussed.        Trevor Mace, PA-C 12/16/12 1258  Trevor Mace, PA-C 01/02/13 [phone]

## 2012-12-16 NOTE — ED Notes (Signed)
Pt states abd pain with nausea but denies vomiting. Pt states has history of many abd surgeries and complications. Also complaining of headache.

## 2012-12-16 NOTE — ED Notes (Signed)
Pt requests IV removed when I give her the injection of medication.

## 2013-01-02 NOTE — ED Provider Notes (Signed)
Medical screening examination/treatment/procedure(s) were performed by non-physician practitioner and as supervising physician I was immediately available for consultation/collaboration. Devoria Albe, MD, [facility]  Ward Givens, MD 01/02/13 [phone]

## 2013-01-16 ENCOUNTER — Emergency Department (HOSPITAL_COMMUNITY)
Admission: EM | Admit: 2013-01-16 | Discharge: 2013-01-16 | Disposition: A | Discharge status: Home / Self Care | Payer: Medicare Other | Attending: Emergency Medicine | Admitting: Emergency Medicine

## 2013-01-16 ENCOUNTER — Emergency Department (HOSPITAL_COMMUNITY): Payer: Medicare Other

## 2013-01-16 ENCOUNTER — Encounter (HOSPITAL_COMMUNITY): Payer: Self-pay | Admitting: *Deleted

## 2013-01-16 DIAGNOSIS — R52 Pain, unspecified: Secondary | ICD-10-CM | POA: Insufficient documentation

## 2013-01-16 DIAGNOSIS — G8929 Other chronic pain: Secondary | ICD-10-CM | POA: Insufficient documentation

## 2013-01-16 DIAGNOSIS — R0789 Other chest pain: Secondary | ICD-10-CM | POA: Insufficient documentation

## 2013-01-16 DIAGNOSIS — J029 Acute pharyngitis, unspecified: Secondary | ICD-10-CM | POA: Insufficient documentation

## 2013-01-16 DIAGNOSIS — Z79899 Other long term (current) drug therapy: Secondary | ICD-10-CM | POA: Insufficient documentation

## 2013-01-16 DIAGNOSIS — Z8719 Personal history of other diseases of the digestive system: Secondary | ICD-10-CM | POA: Insufficient documentation

## 2013-01-16 DIAGNOSIS — J3489 Other specified disorders of nose and nasal sinuses: Secondary | ICD-10-CM | POA: Insufficient documentation

## 2013-01-16 DIAGNOSIS — J111 Influenza due to unidentified influenza virus with other respiratory manifestations: Secondary | ICD-10-CM

## 2013-01-16 DIAGNOSIS — M549 Dorsalgia, unspecified: Secondary | ICD-10-CM | POA: Insufficient documentation

## 2013-01-16 DIAGNOSIS — F319 Bipolar disorder, unspecified: Secondary | ICD-10-CM | POA: Insufficient documentation

## 2013-01-16 DIAGNOSIS — H53149 Visual discomfort, unspecified: Secondary | ICD-10-CM | POA: Insufficient documentation

## 2013-01-16 DIAGNOSIS — IMO0001 Reserved for inherently not codable concepts without codable children: Secondary | ICD-10-CM | POA: Insufficient documentation

## 2013-01-16 DIAGNOSIS — R062 Wheezing: Secondary | ICD-10-CM | POA: Insufficient documentation

## 2013-01-16 DIAGNOSIS — R42 Dizziness and giddiness: Secondary | ICD-10-CM | POA: Insufficient documentation

## 2013-01-16 DIAGNOSIS — R0602 Shortness of breath: Secondary | ICD-10-CM | POA: Insufficient documentation

## 2013-01-16 DIAGNOSIS — R059 Cough, unspecified: Secondary | ICD-10-CM | POA: Insufficient documentation

## 2013-01-16 DIAGNOSIS — E7800 Pure hypercholesterolemia, unspecified: Secondary | ICD-10-CM | POA: Insufficient documentation

## 2013-01-16 DIAGNOSIS — I10 Essential (primary) hypertension: Secondary | ICD-10-CM | POA: Insufficient documentation

## 2013-01-16 DIAGNOSIS — G40909 Epilepsy, unspecified, not intractable, without status epilepticus: Secondary | ICD-10-CM | POA: Insufficient documentation

## 2013-01-16 DIAGNOSIS — R05 Cough: Secondary | ICD-10-CM | POA: Insufficient documentation

## 2013-01-16 DIAGNOSIS — R110 Nausea: Secondary | ICD-10-CM | POA: Insufficient documentation

## 2013-01-16 LAB — CBC WITH DIFFERENTIAL/PLATELET
Eosinophils Absolute: 0.4 10*3/uL (ref 0.0–0.7)
Eosinophils Relative: 8 % — ABNORMAL HIGH (ref 0–5)
HCT: 35.1 % — ABNORMAL LOW (ref 36.0–46.0)
Hemoglobin: 11.6 g/dL — ABNORMAL LOW (ref 12.0–15.0)
Lymphocytes Relative: 24 % (ref 12–46)
Lymphs Abs: 1.3 10*3/uL (ref 0.7–4.0)
MCH: 26.7 pg (ref 26.0–34.0)
MCV: 80.9 fL (ref 78.0–100.0)
Monocytes Relative: 10 % (ref 3–12)
RBC: 4.34 MIL/uL (ref 3.87–5.11)
WBC: 5.3 10*3/uL (ref 4.0–10.5)

## 2013-01-16 LAB — BASIC METABOLIC PANEL
BUN: 10 mg/dL (ref 6–23)
CO2: 23 mEq/L (ref 19–32)
Calcium: 9.5 mg/dL (ref 8.4–10.5)
GFR calc non Af Amer: 56 mL/min — ABNORMAL LOW (ref >90)
Glucose, Bld: 115 mg/dL — ABNORMAL HIGH (ref 70–99)
Potassium: 3.2 mEq/L — ABNORMAL LOW (ref 3.5–5.1)

## 2013-01-16 LAB — URINALYSIS, ROUTINE W REFLEX MICROSCOPIC
Bilirubin Urine: NEGATIVE
Glucose, UA: NEGATIVE mg/dL
Hgb urine dipstick: NEGATIVE
Specific Gravity, Urine: 1.01 (ref 1.005–1.030)
Urobilinogen, UA: 0.2 mg/dL (ref 0.0–1.0)

## 2013-01-16 LAB — URINE MICROSCOPIC-ADD ON

## 2013-01-16 MED ORDER — HYDROMORPHONE HCL PF 1 MG/ML IJ SOLN
1.0000 mg | Freq: Once | INTRAMUSCULAR | Status: AC
  Administered 2013-01-16: 1 mg via INTRAVENOUS

## 2013-01-16 MED ORDER — PROMETHAZINE HCL 25 MG/ML IJ SOLN
12.5000 mg | Freq: Once | INTRAMUSCULAR | Status: AC
  Administered 2013-01-16: 12.5 mg via INTRAVENOUS
  Filled 2013-01-16: qty 1

## 2013-01-16 MED ORDER — GUAIFENESIN-CODEINE 100-10 MG/5ML PO SOLN
10.0000 mL | Freq: Once | ORAL | Status: AC
  Administered 2013-01-16: 10 mL via ORAL
  Filled 2013-01-16: qty 10

## 2013-01-16 MED ORDER — IPRATROPIUM BROMIDE 0.02 % IN SOLN
0.5000 mg | Freq: Once | RESPIRATORY_TRACT | Status: AC
  Administered 2013-01-16: 0.5 mg via RESPIRATORY_TRACT
  Filled 2013-01-16: qty 2.5

## 2013-01-16 MED ORDER — SODIUM CHLORIDE 0.9 % IV SOLN
INTRAVENOUS | Status: DC
  Administered 2013-01-16: 150 mL/h via INTRAVENOUS

## 2013-01-16 MED ORDER — ALBUTEROL SULFATE (5 MG/ML) 0.5% IN NEBU
2.5000 mg | INHALATION_SOLUTION | Freq: Once | RESPIRATORY_TRACT | Status: AC
  Administered 2013-01-16: 2.5 mg via RESPIRATORY_TRACT
  Filled 2013-01-16: qty 0.5

## 2013-01-16 MED ORDER — HYDROMORPHONE HCL PF 1 MG/ML IJ SOLN
INTRAMUSCULAR | Status: AC
  Administered 2013-01-16: 1 mg via INTRAVENOUS
  Filled 2013-01-16: qty 1

## 2013-01-16 MED ORDER — ALBUTEROL SULFATE HFA 108 (90 BASE) MCG/ACT IN AERS
2.0000 | INHALATION_SPRAY | RESPIRATORY_TRACT | Status: DC | PRN
  Administered 2013-01-16: 2 via RESPIRATORY_TRACT
  Filled 2013-01-16: qty 6.7

## 2013-01-16 MED ORDER — ALBUTEROL SULFATE (5 MG/ML) 0.5% IN NEBU
5.0000 mg | INHALATION_SOLUTION | Freq: Once | RESPIRATORY_TRACT | Status: AC
  Administered 2013-01-16: 5 mg via RESPIRATORY_TRACT
  Filled 2013-01-16: qty 1

## 2013-01-16 MED ORDER — GUAIFENESIN-CODEINE 100-10 MG/5ML PO SYRP: 5.0000 mL | ORAL_SOLUTION | Freq: Three times a day (TID) | ORAL | Status: DC | PRN

## 2013-01-16 NOTE — ED Notes (Signed)
Requesting additional pain meds.  States "it usually takes 2mg  of Dilaudid when I have a migraine."  Kerrie Buffalo, NP notified.  Declines to give additional pain medication at present.  Patient informed.

## 2013-01-16 NOTE — ED Notes (Signed)
Flu like sx with cough, body aches, fever x 4 days.   Last had tylenol for fever 1 hr PTA.

## 2013-01-16 NOTE — ED Notes (Signed)
Began w/HA, cough 3 days ago, progressed to generalized body aches, malaise.  Fever 104 last night.  Patient diaphoretic, dyspneic.  Cannot afford any prescription meds, including $4, according to patient.  Has been taking OTC Tylenol and Tylenol Severe Flu for symptom management.

## 2013-01-16 NOTE — ED Provider Notes (Signed)
History     CSN: [id number]  Arrival date & time 01/16/13  4098   First MD Initiated Contact with Patient 01/16/13 [phone]      Chief Complaint  Patient presents with  . Influenza     HPI [name] is a 52 y.o. female who presents to the ED with flu like symptoms. The onset was gradual and started 4 days ago. Symptoms include, congestion, cough, fever up to 104 last night, chills, aching all over. She has been taking OTC cough and flu medication without relief. The history was provided by the patient.   Past Medical History  Diagnosis Date  . Hypertension   . Bipolar 1 disorder   . Depression   . Hypercholesteremia   . Acid reflux   . Chronic pain   . Stomach ulcer   . Headache   . Anxiety   . Crohn's disease   . Seizures     Past Surgical History  Procedure Laterality Date  . Cholecystectomy    . Tonsillectomy    . Stomach surgery    . Tubal ligation      Family History  Problem Relation Age of Onset  . Hypertension Father   . Diabetes type II Brother   . Hypertension Brother   . Diabetes type II Mother     History  Substance Use Topics  . Smoking status: Never Smoker   . Smokeless tobacco: Not on file  . Alcohol Use: No    OB History   Grav Para Term Preterm Abortions TAB SAB Ect Mult Living                  Review of Systems  Constitutional: Positive for fever and chills. Negative for diaphoresis and fatigue.  HENT: Positive for congestion, sore throat, sneezing and sinus pressure. Negative for ear pain, facial swelling, neck pain, neck stiffness and dental problem.   Eyes: Positive for photophobia and redness. Negative for pain and discharge.  Respiratory: Positive for cough, chest tightness, shortness of breath and wheezing.   Cardiovascular: Negative for palpitations.  Gastrointestinal: Positive for nausea and abdominal pain. Negative for vomiting, diarrhea, constipation and abdominal distention.  Genitourinary: Negative for dysuria,  frequency, flank pain and difficulty urinating.  Musculoskeletal: Positive for myalgias and back pain. Negative for gait problem.  Skin: Negative for color change and rash.  Neurological: Positive for light-headedness and headaches. Negative for dizziness, speech difficulty, weakness and numbness.  Psychiatric/Behavioral: Negative for confusion and agitation.    Allergies  Compazine; Penicillins; Aspirin; Cephalexin; Ketorolac tromethamine; Metoclopramide hcl; Ondansetron hcl; Sumatriptan; and Tramadol  Home Medications   Current Outpatient Rx  Name  Route  Sig  Dispense  Refill  . acetaminophen (TYLENOL) 500 MG tablet   Oral   Take 500-1,000 mg by mouth daily as needed. pain         . ALPRAZolam (XANAX) 1 MG tablet   Oral   Take 1 mg by mouth 4 (four) times daily.         . ARIPiprazole (ABILIFY) 10 MG tablet   Oral   Take 10 mg by mouth at bedtime.          . cloNIDine (CATAPRES) 0.1 MG tablet   Oral   Take 0.1 mg by mouth 2 (two) times daily.          . DULoxetine (CYMBALTA) 60 MG capsule   Oral   Take 60 mg by mouth every morning.          Marland Kitchen  labetalol (NORMODYNE) 200 MG tablet   Oral   Take 200 mg by mouth 2 (two) times daily.         Marland Kitchen levETIRAcetam (KEPPRA) 500 MG tablet   Oral   Take 500-1,000 mg by mouth 2 (two) times daily. **Take one tablet daily in the morning and two tablets at bedtime**         . lubiprostone (AMITIZA) 8 MCG capsule   Oral   Take 8 mcg by mouth 2 (two) times daily with a meal.          . guaiFENesin-codeine (ROBITUSSIN AC) 100-10 MG/5ML syrup   Oral   Take 5 mLs by mouth 3 (three) times daily as needed for cough.   120 mL   0     BP 96/62  Pulse 99  Temp(Src) 100.4 F (38 C) (Oral)  Resp 20  Ht 5\' 4"  (1.626 m)  Wt 200 lb (90.719 kg)  BMI 34.31 kg/m2  SpO2 98%  Physical Exam  Nursing note and vitals reviewed. Constitutional: She is oriented to person, place, and time. No distress.  Obese W/F appears  uncomfortable  HENT:  Head: Normocephalic and atraumatic.  Eyes: EOM are normal. Pupils are equal, round, and reactive to light.  Neck: Neck supple.  Cardiovascular: Normal rate and regular rhythm.   Pulmonary/Chest: She has decreased breath sounds. She has wheezes.  Abdominal: Soft. There is tenderness (mildly tender epigastric area).  Musculoskeletal: Normal range of motion. She exhibits no edema.  Neurological: She is alert and oriented to person, place, and time. No cranial nerve deficit.  Skin: Skin is warm and dry.  Psychiatric: She has a normal mood and affect. Her behavior is normal. Judgment and thought content normal.   Procedures  Results for orders placed during the hospital encounter of 01/16/13 (from the past 24 hour(s))  CBC WITH DIFFERENTIAL     Status: Abnormal   Collection Time    01/16/13  9:54 AM      Result Value Range   WBC 5.3  4.0 - 10.5 K/uL   RBC 4.34  3.87 - 5.11 MIL/uL   Hemoglobin 11.6 (*) 12.0 - 15.0 g/dL   HCT 47.8 (*) 29.5 - 62.1 %   MCV 80.9  78.0 - 100.0 fL   MCH 26.7  26.0 - 34.0 pg   MCHC 33.0  30.0 - 36.0 g/dL   RDW 30.8  65.7 - 84.6 %   Platelets 169  150 - 400 K/uL   Neutrophils Relative 58  43 - 77 %   Neutro Abs 3.1  1.7 - 7.7 K/uL   Lymphocytes Relative 24  12 - 46 %   Lymphs Abs 1.3  0.7 - 4.0 K/uL   Monocytes Relative 10  3 - 12 %   Monocytes Absolute 0.5  0.1 - 1.0 K/uL   Eosinophils Relative 8 (*) 0 - 5 %   Eosinophils Absolute 0.4  0.0 - 0.7 K/uL   Basophils Relative 0  0 - 1 %   Basophils Absolute 0.0  0.0 - 0.1 K/uL  BASIC METABOLIC PANEL     Status: Abnormal   Collection Time    01/16/13  9:54 AM      Result Value Range   Sodium 136  135 - 145 mEq/L   Potassium 3.2 (*) 3.5 - 5.1 mEq/L   Chloride 100  96 - 112 mEq/L   CO2 23  19 - 32 mEq/L   Glucose, Bld 115 (*) 70 -  99 mg/dL   BUN 10  6 - 23 mg/dL   Creatinine, Ser 4.09  0.50 - 1.10 mg/dL   Calcium 9.5  8.4 - 81.1 mg/dL   GFR calc non Af Amer 56 (*) >90 mL/min   GFR  calc Af Amer 65 (*) >90 mL/min  URINALYSIS, ROUTINE W REFLEX MICROSCOPIC     Status: Abnormal   Collection Time    01/16/13 12:44 PM      Result Value Range   Color, Urine YELLOW  YELLOW   APPearance CLEAR  CLEAR   Specific Gravity, Urine 1.010  1.005 - 1.030   pH 6.0  5.0 - 8.0   Glucose, UA NEGATIVE  NEGATIVE mg/dL   Hgb urine dipstick NEGATIVE  NEGATIVE   Bilirubin Urine NEGATIVE  NEGATIVE   Ketones, ur NEGATIVE  NEGATIVE mg/dL   Protein, ur NEGATIVE  NEGATIVE mg/dL   Urobilinogen, UA 0.2  0.0 - 1.0 mg/dL   Nitrite NEGATIVE  NEGATIVE   Leukocytes, UA TRACE (*) NEGATIVE  URINE MICROSCOPIC-ADD ON     Status: None   Collection Time    01/16/13 12:44 PM      Result Value Range   Squamous Epithelial / LPF RARE  RARE   WBC, UA 0-2  <3 WBC/hpf     Dg Chest 2 View  01/16/2013  *RADIOLOGY REPORT*  Clinical Data: Cough and high fever.  CHEST - 2 VIEW  Comparison: Chest x-ray 12/05/2012.  Findings: Lung volumes are low.  No consolidative airspace disease. No pleural effusions.  Pulmonary vasculature and the cardiomediastinal silhouette are within normal limits.  IMPRESSION: 1. Low lung volumes without radiographic evidence of acute cardiopulmonary disease.   Original Report Authenticated By: Trudie Reed, M.D.     IV hydration, phenergan, tylenol, dilaudid, Neb. Treatment  and observe  Re examined at 13:00 patient continues to have wheezing bilateral and coughing Will give second neb treatment and Robitussin AC  13:40 re examined after second breathing treatment. Much improved. Only occasional wheezing, no rales or rhonchi.  Assessment: 52 y.o. female with influenza  Plan:  Albuterol inhaler    Robitussin AC   Rest, fluids, follow up with PCP  Discussed with the patient and all questioned fully answered. She will return if any problems arise.    Medication List    TAKE these medications       guaiFENesin-codeine 100-10 MG/5ML syrup  Commonly known as:  ROBITUSSIN AC  Take 5  mLs by mouth 3 (three) times daily as needed for cough.      ASK your doctor about these medications       acetaminophen 500 MG tablet  Commonly known as:  TYLENOL  Take 500-1,000 mg by mouth daily as needed. pain     ALPRAZolam 1 MG tablet  Commonly known as:  XANAX  Take 1 mg by mouth 4 (four) times daily.     ARIPiprazole 10 MG tablet  Commonly known as:  ABILIFY  Take 10 mg by mouth at bedtime.     cloNIDine 0.1 MG tablet  Commonly known as:  CATAPRES  Take 0.1 mg by mouth 2 (two) times daily.     DULoxetine 60 MG capsule  Commonly known as:  CYMBALTA  Take 60 mg by mouth every morning.     labetalol 200 MG tablet  Commonly known as:  NORMODYNE  Take 200 mg by mouth 2 (two) times daily.     levETIRAcetam 500 MG tablet  Commonly known as:  KEPPRA  Take 500-1,000 mg by mouth 2 (two) times daily. **Take one tablet daily in the morning and two tablets at bedtime**     lubiprostone 8 MCG capsule  Commonly known as:  AMITIZA  Take 8 mcg by mouth 2 (two) times daily with a meal.            Janne Napoleon, NP 01/16/13 1348

## 2013-01-16 NOTE — ED Provider Notes (Signed)
Medical screening examination/treatment/procedure(s) were performed by non-physician practitioner and as supervising physician I was immediately available for consultation/collaboration.   Cristopher Ciccarelli L Maclean Foister, MD 01/16/13 1536 

## 2013-01-16 NOTE — ED Notes (Addendum)
Patient with no complaints at this time. Respirations even and unlabored. Skin warm/dry. Discharge instructions reviewed with patient at this time. Patient given opportunity to voice concerns/ask questions. Reviewed My Chart w/patient.  IV removed per policy and band-aid applied to site. Patient discharged at this time and left Emergency Department with steady gait.

## 2013-01-29 ENCOUNTER — Encounter (HOSPITAL_COMMUNITY): Payer: Self-pay

## 2013-01-29 ENCOUNTER — Emergency Department (HOSPITAL_COMMUNITY)
Admission: EM | Admit: 2013-01-29 | Discharge: 2013-01-29 | Disposition: A | Discharge status: Home / Self Care | Payer: Medicare Other | Attending: Emergency Medicine | Admitting: Emergency Medicine

## 2013-01-29 DIAGNOSIS — Z8711 Personal history of peptic ulcer disease: Secondary | ICD-10-CM | POA: Insufficient documentation

## 2013-01-29 DIAGNOSIS — Z8669 Personal history of other diseases of the nervous system and sense organs: Secondary | ICD-10-CM | POA: Insufficient documentation

## 2013-01-29 DIAGNOSIS — M545 Low back pain, unspecified: Secondary | ICD-10-CM | POA: Insufficient documentation

## 2013-01-29 DIAGNOSIS — Z79899 Other long term (current) drug therapy: Secondary | ICD-10-CM | POA: Insufficient documentation

## 2013-01-29 DIAGNOSIS — F319 Bipolar disorder, unspecified: Secondary | ICD-10-CM | POA: Insufficient documentation

## 2013-01-29 DIAGNOSIS — Z8719 Personal history of other diseases of the digestive system: Secondary | ICD-10-CM | POA: Insufficient documentation

## 2013-01-29 DIAGNOSIS — Z862 Personal history of diseases of the blood and blood-forming organs and certain disorders involving the immune mechanism: Secondary | ICD-10-CM | POA: Insufficient documentation

## 2013-01-29 DIAGNOSIS — Z8639 Personal history of other endocrine, nutritional and metabolic disease: Secondary | ICD-10-CM | POA: Insufficient documentation

## 2013-01-29 DIAGNOSIS — G40909 Epilepsy, unspecified, not intractable, without status epilepticus: Secondary | ICD-10-CM | POA: Insufficient documentation

## 2013-01-29 DIAGNOSIS — G8929 Other chronic pain: Secondary | ICD-10-CM | POA: Insufficient documentation

## 2013-01-29 DIAGNOSIS — F411 Generalized anxiety disorder: Secondary | ICD-10-CM | POA: Insufficient documentation

## 2013-01-29 DIAGNOSIS — I10 Essential (primary) hypertension: Secondary | ICD-10-CM | POA: Insufficient documentation

## 2013-01-29 MED ORDER — MORPHINE SULFATE 2 MG/ML IJ SOLN
6.0000 mg | Freq: Once | INTRAMUSCULAR | Status: AC
  Administered 2013-01-29: 6 mg via INTRAMUSCULAR
  Filled 2013-01-29: qty 3

## 2013-01-29 NOTE — ED Provider Notes (Signed)
History     CSN: [id number]  Arrival date & time 01/29/13  1640   First MD Initiated Contact with Patient 01/29/13 1809      Chief Complaint  Patient presents with  . Back Pain    (Consider location/radiation/quality/duration/timing/severity/associated sxs/prior treatment) HPI Comments: Patient with hx of chronic lower back pain c/o worseing pain for several days.  Describes the pain as sharp and shooting into both LE's.  States the pain is worse with movement.  She denies injury, incontinence of bladder or bowel, abd pain, saddle anesthesia's or numbness or weakness of the LE's.  Patient is a 52 y.o. female presenting with back pain. The history is provided by the patient.  Back Pain Location:  Lumbar spine Quality:  Aching, shooting and stabbing Radiates to:  L posterior upper leg and R posterior upper leg Pain severity:  Moderate Timing:  Constant Progression:  Worsening Chronicity:  Chronic Context: not emotional stress, not falling, not jumping from heights, not MVA, not recent illness and not recent injury   Relieved by:  Nothing Worsened by:  Ambulation, bending, movement, standing, twisting and sitting Ineffective treatments:  OTC medications Associated symptoms: leg pain   Associated symptoms: no abdominal pain, no abdominal swelling, no bladder incontinence, no bowel incontinence, no chest pain, no dysuria, no fever, no headaches, no numbness, no paresthesias, no pelvic pain, no perianal numbness, no tingling and no weakness     Past Medical History  Diagnosis Date  . Hypertension   . Bipolar 1 disorder   . Depression   . Hypercholesteremia   . Acid reflux   . Chronic pain   . Stomach ulcer   . Headache   . Anxiety   . Crohn's disease   . Seizures     Past Surgical History  Procedure Laterality Date  . Cholecystectomy    . Tonsillectomy    . Stomach surgery    . Tubal ligation      Family History  Problem Relation Age of Onset  . Hypertension Father    . Diabetes type II Brother   . Hypertension Brother   . Diabetes type II Mother     History  Substance Use Topics  . Smoking status: Never Smoker   . Smokeless tobacco: Not on file  . Alcohol Use: No    OB History   Grav Para Term Preterm Abortions TAB SAB Ect Mult Living                  Review of Systems  Constitutional: Negative for fever.  Respiratory: Negative for shortness of breath.   Cardiovascular: Negative for chest pain.  Gastrointestinal: Negative for vomiting, abdominal pain, constipation and bowel incontinence.  Genitourinary: Negative for bladder incontinence, dysuria, hematuria, flank pain, decreased urine volume, difficulty urinating and pelvic pain.       No perineal numbness or incontinence of urine or feces  Musculoskeletal: Positive for back pain. Negative for joint swelling.  Skin: Negative for rash.  Neurological: Negative for tingling, weakness, numbness, headaches and paresthesias.  All other systems reviewed and are negative.    Allergies  Compazine; Penicillins; Aspirin; Cephalexin; Ketorolac tromethamine; Metoclopramide hcl; Ondansetron hcl; Sumatriptan; and Tramadol  Home Medications   Current Outpatient Rx  Name  Route  Sig  Dispense  Refill  . acetaminophen (TYLENOL) 500 MG tablet   Oral   Take 500-1,000 mg by mouth daily as needed. pain         . ARIPiprazole (ABILIFY) 10 MG  tablet   Oral   Take 10 mg by mouth at bedtime.          . cloNIDine (CATAPRES) 0.1 MG tablet   Oral   Take 0.1 mg by mouth 2 (two) times daily.          . DULoxetine (CYMBALTA) 60 MG capsule   Oral   Take 60 mg by mouth every morning.          . labetalol (NORMODYNE) 200 MG tablet   Oral   Take 200 mg by mouth 2 (two) times daily.         Marland Kitchen levETIRAcetam (KEPPRA) 500 MG tablet   Oral   Take 500-1,000 mg by mouth 2 (two) times daily. **Take one tablet daily in the morning and two tablets at bedtime**         . lubiprostone (AMITIZA) 8  MCG capsule   Oral   Take 8 mcg by mouth 2 (two) times daily with a meal.            BP 128/82  Pulse 78  Temp(Src) 98.2 F (36.8 C)  Resp 18  Ht 5\' 4"  (1.626 m)  Wt 190 lb (86.183 kg)  BMI 32.6 kg/m2  SpO2 99%  Physical Exam  Nursing note and vitals reviewed. Constitutional: She is oriented to person, place, and time. She appears well-developed and well-nourished. No distress.  HENT:  Head: Normocephalic and atraumatic.  Neck: Normal range of motion. Neck supple.  Cardiovascular: Normal rate, regular rhythm and intact distal pulses.   No murmur heard. Pulmonary/Chest: Effort normal and breath sounds normal.  Musculoskeletal: She exhibits tenderness. She exhibits no edema.       Lumbar back: She exhibits tenderness and pain. She exhibits normal range of motion, no swelling, no deformity, no laceration and normal pulse.       Back:  Diffuse ttp of the lumbar spine and paraspinal muscles.  DP pulses are brisk, distal sensation itnact  Neurological: She is alert and oriented to person, place, and time. No cranial nerve deficit or sensory deficit. She exhibits normal muscle tone. Coordination and gait normal.  Reflex Scores:      Patellar reflexes are 2+ on the right side and 2+ on the left side.      Achilles reflexes are 2+ on the right side and 2+ on the left side. Skin: Skin is warm and dry.    ED Course  Procedures (including critical care time)  Labs Reviewed - No data to display No results found.      MDM    Previous ED charts reviewed.  Pt has known hx of chronic low back pain and frequent ED visits for same.  She has ambulated to the restroom with a steady gait.  No focal neuro deficits.  Doubt emergent neurological or infectious process  Pt was reviewed on the [place] narcotic database.  No recent narcotic filled except cheratussin cough syrup last month.    Pt given IM morphine in the dept,.  Advised that no narcotic prescriptions would be given today.  Pt  agrees to find a PMD, referral info given    Tammy L. Triplett, PA-C 01/31/13 2346

## 2013-01-29 NOTE — ED Notes (Signed)
Patient presents with c/o lower right back pain. No urinary symptoms. No back injury. Patient has chronic back pain.

## 2013-01-30 ENCOUNTER — Encounter (HOSPITAL_COMMUNITY): Payer: Self-pay

## 2013-01-30 ENCOUNTER — Emergency Department (HOSPITAL_COMMUNITY)
Admission: EM | Admit: 2013-01-30 | Discharge: 2013-01-30 | Disposition: A | Discharge status: Home / Self Care | Payer: Medicare Other | Attending: Emergency Medicine | Admitting: Emergency Medicine

## 2013-01-30 DIAGNOSIS — I10 Essential (primary) hypertension: Secondary | ICD-10-CM | POA: Insufficient documentation

## 2013-01-30 DIAGNOSIS — Z862 Personal history of diseases of the blood and blood-forming organs and certain disorders involving the immune mechanism: Secondary | ICD-10-CM | POA: Insufficient documentation

## 2013-01-30 DIAGNOSIS — Z8639 Personal history of other endocrine, nutritional and metabolic disease: Secondary | ICD-10-CM | POA: Insufficient documentation

## 2013-01-30 DIAGNOSIS — Z8719 Personal history of other diseases of the digestive system: Secondary | ICD-10-CM | POA: Insufficient documentation

## 2013-01-30 DIAGNOSIS — Z79899 Other long term (current) drug therapy: Secondary | ICD-10-CM | POA: Insufficient documentation

## 2013-01-30 DIAGNOSIS — Z8669 Personal history of other diseases of the nervous system and sense organs: Secondary | ICD-10-CM | POA: Insufficient documentation

## 2013-01-30 DIAGNOSIS — F411 Generalized anxiety disorder: Secondary | ICD-10-CM | POA: Insufficient documentation

## 2013-01-30 DIAGNOSIS — G8929 Other chronic pain: Secondary | ICD-10-CM

## 2013-01-30 DIAGNOSIS — F319 Bipolar disorder, unspecified: Secondary | ICD-10-CM | POA: Insufficient documentation

## 2013-01-30 DIAGNOSIS — M545 Low back pain, unspecified: Secondary | ICD-10-CM | POA: Insufficient documentation

## 2013-01-30 DIAGNOSIS — F3289 Other specified depressive episodes: Secondary | ICD-10-CM | POA: Insufficient documentation

## 2013-01-30 MED ORDER — MORPHINE SULFATE 4 MG/ML IJ SOLN
8.0000 mg | Freq: Once | INTRAMUSCULAR | Status: AC
  Administered 2013-01-30: 8 mg via INTRAMUSCULAR
  Filled 2013-01-30: qty 2

## 2013-01-30 MED ORDER — METHOCARBAMOL 500 MG PO TABS: 500.0000 mg | ORAL_TABLET | Freq: Three times a day (TID) | ORAL | Status: DC

## 2013-01-30 MED ORDER — METHOCARBAMOL 500 MG PO TABS
1000.0000 mg | ORAL_TABLET | Freq: Once | ORAL | Status: AC
  Administered 2013-01-30: 1000 mg via ORAL
  Filled 2013-01-30: qty 2

## 2013-01-30 MED ORDER — DEXAMETHASONE SODIUM PHOSPHATE 4 MG/ML IJ SOLN
10.0000 mg | Freq: Once | INTRAMUSCULAR | Status: AC
  Administered 2013-01-30: 10 mg via INTRAMUSCULAR
  Filled 2013-01-30: qty 3

## 2013-01-30 MED ORDER — DEXAMETHASONE 6 MG PO TABS: ORAL_TABLET | ORAL | Status: DC

## 2013-01-30 NOTE — ED Provider Notes (Signed)
History     CSN: [id number]  Arrival date & time 01/30/13  1526   First MD Initiated Contact with Patient 01/30/13 1724      Chief Complaint  Patient presents with  . Back Pain    (Consider location/radiation/quality/duration/timing/severity/associated sxs/prior treatment) Patient is a 52 y.o. female presenting with back pain. The history is provided by the patient.  Back Pain Location:  Lumbar spine Quality: sharpe. Radiates to:  Does not radiate Pain severity:  Moderate Pain is:  Same all the time Onset quality:  Gradual Timing:  Constant Progression:  Worsening Chronicity:  Chronic Context comment:  Chronic back pain Relieved by:  Nothing Worsened by:  Nothing tried Ineffective treatments:  None tried Associated symptoms: no abdominal pain, no chest pain, no dysuria, no numbness, no tingling and no weakness     Past Medical History  Diagnosis Date  . Hypertension   . Bipolar 1 disorder   . Depression   . Hypercholesteremia   . Acid reflux   . Chronic pain   . Stomach ulcer   . Headache   . Anxiety   . Crohn's disease   . Seizures     Past Surgical History  Procedure Laterality Date  . Cholecystectomy    . Tonsillectomy    . Stomach surgery    . Tubal ligation      Family History  Problem Relation Age of Onset  . Hypertension Father   . Diabetes type II Brother   . Hypertension Brother   . Diabetes type II Mother     History  Substance Use Topics  . Smoking status: Never Smoker   . Smokeless tobacco: Not on file  . Alcohol Use: No    OB History   Grav Para Term Preterm Abortions TAB SAB Ect Mult Living                  Review of Systems  Constitutional: Negative for activity change.       All ROS Neg except as noted in HPI  HENT: Negative for nosebleeds and neck pain.   Eyes: Negative for photophobia and discharge.  Respiratory: Negative for cough, shortness of breath and wheezing.   Cardiovascular: Negative for chest pain and  palpitations.  Gastrointestinal: Negative for abdominal pain and blood in stool.  Genitourinary: Negative for dysuria, frequency and hematuria.  Musculoskeletal: Positive for back pain. Negative for arthralgias.  Skin: Negative.   Neurological: Negative for dizziness, tingling, seizures, speech difficulty, weakness and numbness.  Psychiatric/Behavioral: Negative for hallucinations and confusion.    Allergies  Compazine; Penicillins; Aspirin; Cephalexin; Ketorolac tromethamine; Metoclopramide hcl; Ondansetron hcl; Sumatriptan; and Tramadol  Home Medications   Current Outpatient Rx  Name  Route  Sig  Dispense  Refill  . acetaminophen (TYLENOL) 500 MG tablet   Oral   Take 500-1,000 mg by mouth daily as needed. pain         . ARIPiprazole (ABILIFY) 10 MG tablet   Oral   Take 10 mg by mouth at bedtime.          . cloNIDine (CATAPRES) 0.1 MG tablet   Oral   Take 0.1 mg by mouth 2 (two) times daily.          . DULoxetine (CYMBALTA) 60 MG capsule   Oral   Take 60 mg by mouth every morning.          . labetalol (NORMODYNE) 200 MG tablet   Oral   Take 200 mg by  mouth 2 (two) times daily.         Marland Kitchen levETIRAcetam (KEPPRA) 500 MG tablet   Oral   Take 500-1,000 mg by mouth 2 (two) times daily. **Take one tablet daily in the morning and two tablets at bedtime**         . lubiprostone (AMITIZA) 8 MCG capsule   Oral   Take 8 mcg by mouth 2 (two) times daily with a meal.            BP 172/97  Pulse 74  Temp(Src) 98 F (36.7 C)  Resp 18  SpO2 98%  Physical Exam  Nursing note and vitals reviewed. Constitutional: She is oriented to person, place, and time. She appears well-developed and well-nourished.  Non-toxic appearance.  HENT:  Head: Normocephalic.  Right Ear: Tympanic membrane and external ear normal.  Left Ear: Tympanic membrane and external ear normal.  Eyes: EOM and lids are normal. Pupils are equal, round, and reactive to light.  Neck: Normal range of  motion. Neck supple. Carotid bruit is not present.  Cardiovascular: Normal rate, regular rhythm, normal heart sounds, intact distal pulses and normal pulses.   Pulmonary/Chest: Breath sounds normal. No respiratory distress.  Abdominal: Soft. Bowel sounds are normal. There is no tenderness. There is no guarding.  Musculoskeletal: Normal range of motion.  Pain to palpation and attempted ROM of the right lumbar area. No palpable step off at the lumbar level. No hot areas. ROM of the right hip and leg cause low back pain.  Lymphadenopathy:       Head (right side): No submandibular adenopathy present.       Head (left side): No submandibular adenopathy present.    She has no cervical adenopathy.  Neurological: She is alert and oriented to person, place, and time. She has normal strength. No cranial nerve deficit or sensory deficit. She exhibits normal muscle tone. Coordination normal.  Skin: Skin is warm and dry.  Psychiatric: She has a normal mood and affect. Her speech is normal.    ED Course  Procedures (including critical care time)  Labs Reviewed - No data to display No results found.   No diagnosis found.    MDM  I have reviewed nursing notes, vital signs, and all appropriate lab and imaging results for this patient. Patient has a history of chronic back pain. There's been no recent injury or accident reported. Gait slow but steady. Patient is treated in the emergency department with Decadron 10 mg, morphine 8 mg IM and Robaxin 1 g by mouth. Prescription for Decadron and Robaxin given to the patient. Patient advised to obtain a primary care physician. Patient given the [facility] main  number and suggested that she request the physicians taking new patients.       Kathie Dike, [place] 01/30/13 [phone]

## 2013-01-30 NOTE — ED Notes (Signed)
Pt states she has some back pain that started a week ago. Came in yesterday 01/29/13 and was given a 6 mg injection of morphine. Pt not sure how she injured her back, "it just started hurting."

## 2013-01-30 NOTE — ED Notes (Signed)
Low back pain, seen here yesterday for same.  Says she could not get  appt with  Her doctor.

## 2013-01-30 NOTE — ED Provider Notes (Signed)
Medical screening examination/treatment/procedure(s) were performed by non-physician practitioner and as supervising physician I was immediately available for consultation/collaboration.   Shelda Jakes, MD 01/30/13 [phone]

## 2013-02-03 NOTE — ED Provider Notes (Signed)
Medical screening examination/treatment/procedure(s) were performed by non-physician practitioner and as supervising physician I was immediately available for consultation/collaboration.   Scott W. Zackowski, MD 02/03/13 0120 

## 2013-02-08 ENCOUNTER — Encounter (HOSPITAL_COMMUNITY): Payer: Self-pay

## 2013-02-08 ENCOUNTER — Emergency Department (HOSPITAL_COMMUNITY): Payer: Medicare Other

## 2013-02-08 ENCOUNTER — Emergency Department (HOSPITAL_COMMUNITY)
Admission: EM | Admit: 2013-02-08 | Discharge: 2013-02-08 | Disposition: A | Discharge status: Home / Self Care | Payer: Medicare Other | Attending: Emergency Medicine | Admitting: Emergency Medicine

## 2013-02-08 DIAGNOSIS — F411 Generalized anxiety disorder: Secondary | ICD-10-CM | POA: Insufficient documentation

## 2013-02-08 DIAGNOSIS — Z79899 Other long term (current) drug therapy: Secondary | ICD-10-CM | POA: Insufficient documentation

## 2013-02-08 DIAGNOSIS — R51 Headache: Secondary | ICD-10-CM | POA: Insufficient documentation

## 2013-02-08 DIAGNOSIS — G40909 Epilepsy, unspecified, not intractable, without status epilepticus: Secondary | ICD-10-CM | POA: Insufficient documentation

## 2013-02-08 DIAGNOSIS — R0602 Shortness of breath: Secondary | ICD-10-CM | POA: Insufficient documentation

## 2013-02-08 DIAGNOSIS — R059 Cough, unspecified: Secondary | ICD-10-CM | POA: Insufficient documentation

## 2013-02-08 DIAGNOSIS — R071 Chest pain on breathing: Secondary | ICD-10-CM | POA: Insufficient documentation

## 2013-02-08 DIAGNOSIS — K219 Gastro-esophageal reflux disease without esophagitis: Secondary | ICD-10-CM | POA: Insufficient documentation

## 2013-02-08 DIAGNOSIS — G8929 Other chronic pain: Secondary | ICD-10-CM | POA: Insufficient documentation

## 2013-02-08 DIAGNOSIS — F319 Bipolar disorder, unspecified: Secondary | ICD-10-CM | POA: Insufficient documentation

## 2013-02-08 DIAGNOSIS — I10 Essential (primary) hypertension: Secondary | ICD-10-CM | POA: Insufficient documentation

## 2013-02-08 DIAGNOSIS — R112 Nausea with vomiting, unspecified: Secondary | ICD-10-CM | POA: Insufficient documentation

## 2013-02-08 DIAGNOSIS — Z8719 Personal history of other diseases of the digestive system: Secondary | ICD-10-CM | POA: Insufficient documentation

## 2013-02-08 LAB — CBC WITH DIFFERENTIAL/PLATELET
Basophils Absolute: 0 10*3/uL (ref 0.0–0.1)
HCT: 37.7 % (ref 36.0–46.0)
Hemoglobin: 12.6 g/dL (ref 12.0–15.0)
Lymphocytes Relative: 25 % (ref 12–46)
Monocytes Absolute: 1 10*3/uL (ref 0.1–1.0)
Neutro Abs: 8.8 10*3/uL — ABNORMAL HIGH (ref 1.7–7.7)
RDW: 14.4 % (ref 11.5–15.5)
WBC: 13.3 10*3/uL — ABNORMAL HIGH (ref 4.0–10.5)

## 2013-02-08 LAB — COMPREHENSIVE METABOLIC PANEL
ALT: 53 U/L — ABNORMAL HIGH (ref 0–35)
AST: 40 U/L — ABNORMAL HIGH (ref 0–37)
Alkaline Phosphatase: 114 U/L (ref 39–117)
CO2: 25 mEq/L (ref 19–32)
Chloride: 98 mEq/L (ref 96–112)
Creatinine, Ser: 0.69 mg/dL (ref 0.50–1.10)
GFR calc non Af Amer: >90 mL/min (ref >90)
Sodium: 135 mEq/L (ref 135–145)
Total Bilirubin: 0.3 mg/dL (ref 0.3–1.2)

## 2013-02-08 MED ORDER — PROMETHAZINE HCL 25 MG RE SUPP: 25.0000 mg | Freq: Four times a day (QID) | RECTAL | Status: DC | PRN

## 2013-02-08 MED ORDER — VALPROATE SODIUM 500 MG/5ML IV SOLN
INTRAVENOUS | Status: AC
  Filled 2013-02-08: qty 10

## 2013-02-08 MED ORDER — DIPHENHYDRAMINE HCL 50 MG/ML IJ SOLN
25.0000 mg | Freq: Once | INTRAMUSCULAR | Status: DC
  Administered 2013-02-08: 25 mg via INTRAVENOUS
  Filled 2013-02-08: qty 1

## 2013-02-08 MED ORDER — SODIUM CHLORIDE 0.9 % IV BOLUS (SEPSIS)
1000.0000 mL | Freq: Once | INTRAVENOUS | Status: AC
  Administered 2013-02-08: 1000 mL via INTRAVENOUS

## 2013-02-08 MED ORDER — SODIUM CHLORIDE 0.9 % IV SOLN: 1000.0000 mL | INTRAVENOUS | Status: DC

## 2013-02-08 MED ORDER — VALPROATE SODIUM 500 MG/5ML IV SOLN
1000.0000 mg | Freq: Once | INTRAVENOUS | Status: AC
  Administered 2013-02-08: 1000 mg via INTRAVENOUS
  Filled 2013-02-08: qty 10

## 2013-02-08 MED ORDER — SODIUM CHLORIDE 0.9 % IV SOLN
1000.0000 mL | Freq: Once | INTRAVENOUS | Status: AC
  Administered 2013-02-08: 1000 mL via INTRAVENOUS

## 2013-02-08 MED ORDER — CYCLOBENZAPRINE HCL 10 MG PO TABS: 10.0000 mg | ORAL_TABLET | Freq: Three times a day (TID) | ORAL | Status: DC | PRN

## 2013-02-08 MED ORDER — LORAZEPAM 2 MG/ML IJ SOLN
1.0000 mg | Freq: Once | INTRAMUSCULAR | Status: DC
  Administered 2013-02-08: 1 mg via INTRAVENOUS
  Filled 2013-02-08: qty 1

## 2013-02-08 MED ORDER — PROMETHAZINE HCL 25 MG PO TABS: 25.0000 mg | ORAL_TABLET | Freq: Three times a day (TID) | ORAL | Status: DC | PRN

## 2013-02-08 MED ORDER — MAGNESIUM SULFATE 40 MG/ML IJ SOLN
2.0000 g | Freq: Once | INTRAMUSCULAR | Status: AC
  Administered 2013-02-08: 2 g via INTRAVENOUS
  Filled 2013-02-08: qty 50

## 2013-02-08 MED ORDER — FAMOTIDINE IN NACL 20-0.9 MG/50ML-% IV SOLN
20.0000 mg | Freq: Once | INTRAVENOUS | Status: DC
  Administered 2013-02-08: 20 mg via INTRAVENOUS
  Filled 2013-02-08: qty 50

## 2013-02-08 MED ORDER — PROMETHAZINE HCL 25 MG/ML IJ SOLN
25.0000 mg | Freq: Once | INTRAMUSCULAR | Status: DC
Start: 2013-02-08 — End: 2013-02-09
  Administered 2013-02-08: 25 mg via INTRAVENOUS
  Filled 2013-02-08 (×2): qty 1

## 2013-02-08 MED ORDER — PROMETHAZINE HCL 25 MG/ML IJ SOLN
25.0000 mg | Freq: Once | INTRAMUSCULAR | Status: AC
  Administered 2013-02-08: 25 mg via INTRAVENOUS

## 2013-02-08 MED ORDER — MAGNESIUM SULFATE 40 MG/ML IJ SOLN: 2.0000 g | Freq: Once | INTRAMUSCULAR | Status: DC

## 2013-02-08 NOTE — ED Notes (Signed)
Pt asked for more ativan, EDP aware and pt is not getting at this time.

## 2013-02-08 NOTE — ED Notes (Signed)
Patient would like more ativan at this time. RN made aware.

## 2013-02-08 NOTE — ED Provider Notes (Signed)
History  This chart was scribed for Ward Givens, MD by Bennett Scrape, ED Scribe. This patient was seen in room APA05/AP[facility]05 and the patient's care was started at 5:07 PM.  CSN: [id number]  Arrival date & time 02/08/13  1652   First MD Initiated Contact with Patient 02/08/13 1707      Chief Complaint  Patient presents with  . Chest Pain  . Headache    Patient is a 52 y.o. female presenting with chest pain. The history is provided by the patient. No language interpreter was used.  Chest Pain Pain quality: tightness   Pain radiates to:  Does not radiate Pain radiates to the back: no   Onset quality:  Gradual Duration:  3 days Timing:  Constant Progression:  Worsening Chronicity:  New Associated symptoms: cough, headache, shortness of breath and vomiting   Associated symptoms: no abdominal pain, no fever and no nausea     [name] is a 52 y.o. female who presents to the Emergency Department complaining of 3 days of gradual onset, gradually worsening, constant chest pain described as tightness with associated orthopnea, constant frontal HA described as sharp and pounding for the past 2 days and emesis that started 8 hours ago. She reports that exertion worsens her pain. She denies any heavy lifting and denies having any prior episodes of similar symptoms. She reports a family h/o CHF and states that her father died at 52 and that her mother had a CABG at the age of 64. She also reports that her brother has a h/o HTN and DM and that she has a h/o heart disease in her aunts and uncles on both her mother and father's sides. In regards to the HA, she reports similarities to prior HAs and states that sitting up and applying "cold rags" improves her symptoms. She states that she did feel burning in her throat with the emesis today but denies having a h/o GERD. She reports a cough for the past couple of weeks and a fever of 100 yesterday but denies diarrhea as associated symptoms. She has a  h/o chronic back pain which she has been seen 14 times in the ED over the past 6 months but denies being on any pain medication currently. She reports that she was seen at [facility] for back pain recently but denies receiving any prescriptions. She reports that she quit taking xanax a couple of months ago but has refills for her other medications for bipolar disorder, HTN and depression. She denies any illegal drug use, smoking and alcohol use.  No current PCP. Was dismissed from the practice when Dr. Lebron Conners left in [place]. States she has been unable to find another PCP since.  Past Medical History  Diagnosis Date  . Hypertension   . Bipolar 1 disorder   . Depression   . Hypercholesteremia   . Acid reflux   . Chronic pain   . Stomach ulcer   . Headache   . Anxiety   . Crohn's disease   . Seizures     Past Surgical History  Procedure Laterality Date  . Cholecystectomy    . Tonsillectomy    . Stomach surgery    . Tubal ligation      Family History  Problem Relation Age of Onset  . Hypertension Father   . Diabetes type II Brother   . Hypertension Brother   . Diabetes type II Mother     History  Substance Use Topics  .  Smoking status: Never Smoker   . Smokeless tobacco: Not on file  . Alcohol Use: No  lives with boyfriend Unemployed, on disability for back pain  No OB history provided.  Review of Systems  Constitutional: Negative for fever and chills.  Respiratory: Positive for cough and shortness of breath.   Cardiovascular: Positive for chest pain.  Gastrointestinal: Positive for vomiting. Negative for nausea, abdominal pain and diarrhea.  Neurological: Positive for headaches.  All other systems reviewed and are negative.    Allergies  Compazine; Penicillins; Aspirin; Cephalexin; Ketorolac tromethamine; Metoclopramide hcl; Ondansetron hcl; Sumatriptan; and Tramadol  Home Medications   Current Outpatient Rx  Name  Route  Sig  Dispense  Refill  .  ARIPiprazole (ABILIFY) 10 MG tablet   Oral   Take 10 mg by mouth at bedtime.          . cloNIDine (CATAPRES) 0.1 MG tablet   Oral   Take 0.1 mg by mouth 2 (two) times daily.          . DULoxetine (CYMBALTA) 60 MG capsule   Oral   Take 60 mg by mouth every morning.          . labetalol (NORMODYNE) 200 MG tablet   Oral   Take 200 mg by mouth 2 (two) times daily.         Marland Kitchen levETIRAcetam (KEPPRA) 500 MG tablet   Oral   Take 500-1,000 mg by mouth 2 (two) times daily. **Take one tablet daily in the morning and two tablets at bedtime**         . lubiprostone (AMITIZA) 8 MCG capsule   Oral   Take 8 mcg by mouth 2 (two) times daily with a meal.          . acetaminophen (TYLENOL) 500 MG tablet   Oral   Take 500-1,000 mg by mouth daily as needed. pain         . methocarbamol (ROBAXIN) 500 MG tablet   Oral   Take 1 tablet (500 mg total) by mouth 3 (three) times daily.   21 tablet   0     Triage Vitals: BP 150/98  Pulse 107  Temp(Src) 98 F (36.7 C) (Oral)  Resp 22  Ht 5' 4.5" (1.638 m)  Wt 198 lb (89.812 kg)  BMI 33.47 kg/m2  SpO2 100%  Vital signs normal except hypertension, tachycardia   Physical Exam  Nursing note and vitals reviewed. Constitutional: She is oriented to person, place, and time. She appears well-developed and well-nourished.  Non-toxic appearance. She does not appear ill. No distress.  Pt appears anxious and tremulous, lip smacking  HENT:  Head: Normocephalic and atraumatic.  Right Ear: External ear normal.  Left Ear: External ear normal.  Nose: Nose normal. No mucosal edema or rhinorrhea.  Mouth/Throat: Mucous membranes are normal. No dental abscesses or edematous.  Dry mucus membranes  Eyes: Conjunctivae and EOM are normal. Pupils are equal, round, and reactive to light.  Neck: Normal range of motion and full passive range of motion without pain. Neck supple.  Cardiovascular: Normal rate, regular rhythm and normal heart sounds.  Exam  reveals no gallop and no friction rub.   No murmur heard. Pulmonary/Chest: Effort normal and breath sounds normal. No respiratory distress. She has no wheezes. She has no rhonchi. She has no rales. She exhibits no tenderness and no crepitus.  Abdominal: Soft. Normal appearance and bowel sounds are normal. She exhibits no distension. There is no tenderness. There is  no rebound and no guarding.  Musculoskeletal: Normal range of motion. She exhibits no edema and no tenderness.  Moves all extremities well.   Neurological: She is alert and oriented to person, place, and time. She has normal strength. No cranial nerve deficit.  Skin: Skin is warm, dry and intact. No rash noted. No erythema. No pallor.  Psychiatric: Her speech is normal and behavior is normal. Her mood appears anxious.    ED Course  Procedures (including critical care time)  Medications  0.9 %  sodium chloride infusion (0 mLs Intravenous Stopped 02/08/13 2036)    Followed by  0.9 %  sodium chloride infusion (not administered)  promethazine (PHENERGAN) injection 25 mg (0 mg Intravenous Hold 02/08/13 1809)  famotidine (PEPCID) IVPB 20 mg (0 mg Intravenous Stopped 02/08/13 2036)  diphenhydrAMINE (BENADRYL) injection 25 mg (0 mg Intravenous Hold 02/08/13 1808)  LORazepam (ATIVAN) injection 1 mg (0 mg Intravenous Hold 02/08/13 1808)  valproate (DEPACON) 1,000 mg in dextrose 5 % 50 mL IVPB (0 mg Intravenous Stopped 02/08/13 2111)  sodium chloride 0.9 % bolus 1,000 mL (0 mLs Intravenous Stopped 02/08/13 2215)  magnesium sulfate IVPB 2 g 50 mL (0 g Intravenous Stopped 02/08/13 2036)  promethazine (PHENERGAN) injection 25 mg (25 mg Intravenous Given 02/08/13 2150)    Pt has had 14 ED visits in past 6 months, most for chronic back pain.   DIAGNOSTIC STUDIES: Oxygen Saturation is 100% on room air, normal by my interpretation.    COORDINATION OF CARE: 5:35 PM-Pt is requesting pain medication. Advised pt that she will not receive narcotic  medication due to frquency of ED visits which pt verbalized understanding. Discussed treatment plan which includes medications, CXR, CBC panel and troponin with pt at bedside and pt agreed to plan.   7:48 PM-Pt witnessed ambulating back from the bathroom. Pt rechecked and reports that her HA is an 8 out of 10. She states that the CP has improved. Will order IV fluids and medications for HA.  9:30 PM-Pt states that her HA is improved but now reports abdominal pain currently which she has not mentioned until now. States her chest pain is gone.  Will give pt antiemetic. Pt is requesting pain medication which I denied her. I advised pt to follow up with her PCP for her chronic pain.   Results for orders placed during the hospital encounter of 02/08/13  CBC WITH DIFFERENTIAL      Result Value Range   WBC 13.3 (*) 4.0 - 10.5 K/uL   RBC 4.71  3.87 - 5.11 MIL/uL   Hemoglobin 12.6  12.0 - 15.0 g/dL   HCT 91.4  78.2 - 95.6 %   MCV 80.0  78.0 - 100.0 fL   MCH 26.8  26.0 - 34.0 pg   MCHC 33.4  30.0 - 36.0 g/dL   RDW 21.3  08.6 - 57.8 %   Platelets 288  150 - 400 K/uL   Neutrophils Relative 66  43 - 77 %   Neutro Abs 8.8 (*) 1.7 - 7.7 K/uL   Lymphocytes Relative 25  12 - 46 %   Lymphs Abs 3.3  0.7 - 4.0 K/uL   Monocytes Relative 8  3 - 12 %   Monocytes Absolute 1.0  0.1 - 1.0 K/uL   Eosinophils Relative 1  0 - 5 %   Eosinophils Absolute 0.2  0.0 - 0.7 K/uL   Basophils Relative 0  0 - 1 %   Basophils Absolute 0.0  0.0 -  0.1 K/uL  COMPREHENSIVE METABOLIC PANEL      Result Value Range   Sodium 135  135 - 145 mEq/L   Potassium 3.5  3.5 - 5.1 mEq/L   Chloride 98  96 - 112 mEq/L   CO2 25  19 - 32 mEq/L   Glucose, Bld 99  70 - 99 mg/dL   BUN 7  6 - 23 mg/dL   Creatinine, Ser 1.61  0.50 - 1.10 mg/dL   Calcium 9.4  8.4 - 09.6 mg/dL   Total Protein 7.8  6.0 - 8.3 g/dL   Albumin 4.1  3.5 - 5.2 g/dL   AST 40 (*) 0 - 37 U/L   ALT 53 (*) 0 - 35 U/L   Alkaline Phosphatase 114  39 - 117 U/L   Total  Bilirubin 0.3  0.3 - 1.2 mg/dL   GFR calc non Af Amer >90  >90 mL/min   GFR calc Af Amer >90  >90 mL/min  LIPASE, BLOOD      Result Value Range   Lipase 24  11 - 59 U/L  TROPONIN I      Result Value Range   Troponin I <0.30  <0.30 ng/mL   Laboratory interpretation all normal except leukocytosis  Dg Chest 2 View  02/08/2013  *RADIOLOGY REPORT*  Clinical Data: Chest pain  CHEST - 2 VIEW  Comparison: 01/16/2013  Findings: Epigastric clips are again noted.  Heart size is normal. The lungs are clear.  No pleural effusion.  Linear left midlung zone scarring is stable.  No acute osseous finding.  IMPRESSION: No acute cardiopulmonary process.   Original Report Authenticated By: Christiana Pellant, M.D.      Date: 02/08/2013  Rate: 98  Rhythm: normal sinus rhythm  QRS Axis: normal  Intervals: normal  ST/T Wave abnormalities: normal  Conduction Disutrbances:none  Narrative Interpretation:   Old EKG Reviewed: none available     1. Headache   2. Atypical chest pain   3. Nausea and vomiting    Discharge Medication List as of 02/08/2013 10:06 PM    START taking these medications   Details  cyclobenzaprine (FLEXERIL) 10 MG tablet Take 1 tablet (10 mg total) by mouth 3 (three) times daily as needed for muscle spasms., Starting 02/08/2013, Until Discontinued, Print    promethazine (PHENERGAN) 25 MG suppository Place 1 suppository (25 mg total) rectally every 6 (six) hours as needed for nausea., Starting 02/08/2013, Until Discontinued, Print    promethazine (PHENERGAN) 25 MG tablet Take 1 tablet (25 mg total) by mouth every 8 (eight) hours as needed for nausea., Starting 02/08/2013, Until Discontinued, Print        Plan discharge  Devoria Albe, MD, FACEP    MDM   I personally performed the services described in this documentation, which was scribed in my presence. The recorded information has been reviewed and considered.  Devoria Albe, MD, Armando Gang         Ward Givens, MD 02/09/13  [phone]

## 2013-02-08 NOTE — ED Notes (Signed)
Pt c/o headache for past few days and chest pain that started yesterday.  Reports feels like something is "sqeezing my air off."  Says pain doesn't get worse with movement.

## 2013-02-12 ENCOUNTER — Emergency Department (HOSPITAL_COMMUNITY): Payer: Medicare Other

## 2013-02-12 ENCOUNTER — Emergency Department (HOSPITAL_COMMUNITY)
Admission: EM | Admit: 2013-02-12 | Discharge: 2013-02-12 | Disposition: A | Discharge status: Home / Self Care | Payer: Medicare Other | Attending: Emergency Medicine | Admitting: Emergency Medicine

## 2013-02-12 ENCOUNTER — Encounter (HOSPITAL_COMMUNITY): Payer: Self-pay

## 2013-02-12 DIAGNOSIS — R112 Nausea with vomiting, unspecified: Secondary | ICD-10-CM | POA: Insufficient documentation

## 2013-02-12 DIAGNOSIS — Z8639 Personal history of other endocrine, nutritional and metabolic disease: Secondary | ICD-10-CM | POA: Insufficient documentation

## 2013-02-12 DIAGNOSIS — F411 Generalized anxiety disorder: Secondary | ICD-10-CM | POA: Insufficient documentation

## 2013-02-12 DIAGNOSIS — Z3202 Encounter for pregnancy test, result negative: Secondary | ICD-10-CM | POA: Insufficient documentation

## 2013-02-12 DIAGNOSIS — Z862 Personal history of diseases of the blood and blood-forming organs and certain disorders involving the immune mechanism: Secondary | ICD-10-CM | POA: Insufficient documentation

## 2013-02-12 DIAGNOSIS — Z9089 Acquired absence of other organs: Secondary | ICD-10-CM | POA: Insufficient documentation

## 2013-02-12 DIAGNOSIS — Z9851 Tubal ligation status: Secondary | ICD-10-CM | POA: Insufficient documentation

## 2013-02-12 DIAGNOSIS — Z8719 Personal history of other diseases of the digestive system: Secondary | ICD-10-CM | POA: Insufficient documentation

## 2013-02-12 DIAGNOSIS — R109 Unspecified abdominal pain: Secondary | ICD-10-CM | POA: Insufficient documentation

## 2013-02-12 DIAGNOSIS — I10 Essential (primary) hypertension: Secondary | ICD-10-CM | POA: Insufficient documentation

## 2013-02-12 DIAGNOSIS — F319 Bipolar disorder, unspecified: Secondary | ICD-10-CM | POA: Insufficient documentation

## 2013-02-12 DIAGNOSIS — G40909 Epilepsy, unspecified, not intractable, without status epilepticus: Secondary | ICD-10-CM | POA: Insufficient documentation

## 2013-02-12 LAB — COMPREHENSIVE METABOLIC PANEL
AST: 26 U/L (ref 0–37)
Albumin: 3.9 g/dL (ref 3.5–5.2)
BUN: 10 mg/dL (ref 6–23)
Calcium: 9.5 mg/dL (ref 8.4–10.5)
Creatinine, Ser: 0.76 mg/dL (ref 0.50–1.10)
GFR calc non Af Amer: >90 mL/min (ref >90)

## 2013-02-12 LAB — URINALYSIS, ROUTINE W REFLEX MICROSCOPIC
Glucose, UA: NEGATIVE mg/dL
Leukocytes, UA: NEGATIVE
Nitrite: NEGATIVE
Protein, ur: NEGATIVE mg/dL
pH: 8 (ref 5.0–8.0)

## 2013-02-12 LAB — LIPASE, BLOOD: Lipase: 22 U/L (ref 11–59)

## 2013-02-12 LAB — CBC WITH DIFFERENTIAL/PLATELET
Basophils Absolute: 0 10*3/uL (ref 0.0–0.1)
Basophils Relative: 0 % (ref 0–1)
Eosinophils Relative: 2 % (ref 0–5)
HCT: 33.3 % — ABNORMAL LOW (ref 36.0–46.0)
MCH: 27.2 pg (ref 26.0–34.0)
MCHC: 33.6 g/dL (ref 30.0–36.0)
MCV: 80.8 fL (ref 78.0–100.0)
Monocytes Absolute: 0.9 10*3/uL (ref 0.1–1.0)
RDW: 14.8 % (ref 11.5–15.5)

## 2013-02-12 LAB — PREGNANCY, URINE: Preg Test, Ur: NEGATIVE

## 2013-02-12 MED ORDER — ONDANSETRON 4 MG PO TBDP: 4.0000 mg | ORAL_TABLET | Freq: Three times a day (TID) | ORAL | Status: DC | PRN

## 2013-02-12 MED ORDER — PROMETHAZINE HCL 25 MG/ML IJ SOLN
25.0000 mg | Freq: Once | INTRAMUSCULAR | Status: AC
  Administered 2013-02-12: 25 mg via INTRAVENOUS
  Filled 2013-02-12: qty 1

## 2013-02-12 MED ORDER — IOHEXOL 300 MG/ML  SOLN
50.0000 mL | Freq: Once | INTRAMUSCULAR | Status: AC | PRN
  Administered 2013-02-12: 50 mL via ORAL

## 2013-02-12 MED ORDER — IOHEXOL 300 MG/ML  SOLN
100.0000 mL | Freq: Once | INTRAMUSCULAR | Status: AC | PRN
  Administered 2013-02-12: 100 mL via INTRAVENOUS

## 2013-02-12 MED ORDER — HYDROMORPHONE HCL PF 1 MG/ML IJ SOLN
0.5000 mg | Freq: Once | INTRAMUSCULAR | Status: AC
  Administered 2013-02-12: 0.5 mg via INTRAVENOUS
  Filled 2013-02-12: qty 1

## 2013-02-12 MED ORDER — SODIUM CHLORIDE 0.9 % IV SOLN
1000.0000 mL | Freq: Once | INTRAVENOUS | Status: AC
  Administered 2013-02-12: 1000 mL via INTRAVENOUS

## 2013-02-12 NOTE — ED Notes (Signed)
Dr.Lockwood at bedside  

## 2013-02-12 NOTE — ED Notes (Signed)
Pt presents with abdominal pain and emesis that began yesterday. No diarrhea.

## 2013-02-12 NOTE — ED Provider Notes (Signed)
History     CSN: [id number]  Arrival date & time 02/12/13  1632   First MD Initiated Contact with Patient 02/12/13 1843      Chief Complaint  Patient presents with  . Abdominal Pain  . Emesis    (Consider location/radiation/quality/duration/timing/severity/associated sxs/prior treatment) HPI  The patient presents with abdominal pain nausea, vomiting.  She states that she has a history of back pain, as well as episodes of abdominal pain with presumptive diagnosis of Crohn's disease, though no gastroenterologist has evaluated the patient. She states approximately 18 hours ago the patient gradually developed increasingly severe diffuse lower abdominal crampiness.  She soon thereafter developed nausea, had multiple episodes of emesis.  She has had 2 small bowel movements over the course of the illness.  She has anorexia, generalized discomfort, but no fever, no dyspnea, no chest pain, the worsening of her chronic back pain. No relief with anything, no clear exacerbating factors.  Past Medical History  Diagnosis Date  . Hypertension   . Bipolar 1 disorder   . Depression   . Hypercholesteremia   . Acid reflux   . Chronic pain   . Stomach ulcer   . Headache   . Anxiety   . Crohn's disease   . Seizures     Past Surgical History  Procedure Laterality Date  . Cholecystectomy    . Tonsillectomy    . Stomach surgery    . Tubal ligation      Family History  Problem Relation Age of Onset  . Hypertension Father   . Diabetes type II Brother   . Hypertension Brother   . Diabetes type II Mother     History  Substance Use Topics  . Smoking status: Never Smoker   . Smokeless tobacco: Not on file  . Alcohol Use: No    OB History   Grav Para Term Preterm Abortions TAB SAB Ect Mult Living                  Review of Systems  Constitutional:       Per HPI, otherwise negative  HENT:       Per HPI, otherwise negative  Respiratory:       Per HPI, otherwise negative   Cardiovascular:       Per HPI, otherwise negative  Gastrointestinal: Positive for nausea, vomiting and abdominal pain.  Endocrine:       Negative aside from HPI  Genitourinary:       Neg aside from HPI   Musculoskeletal:       Per HPI, otherwise negative  Skin: Negative.   Neurological: Negative for syncope.    Allergies  Compazine; Penicillins; Aspirin; Cephalexin; Ketorolac tromethamine; Metoclopramide hcl; Ondansetron hcl; Sumatriptan; and Tramadol  Home Medications   Current Outpatient Rx  Name  Route  Sig  Dispense  Refill  . acetaminophen (TYLENOL) 500 MG tablet   Oral   Take 500-1,000 mg by mouth daily as needed. pain         . ARIPiprazole (ABILIFY) 10 MG tablet   Oral   Take 10 mg by mouth at bedtime.          . cloNIDine (CATAPRES) 0.1 MG tablet   Oral   Take 0.1 mg by mouth 2 (two) times daily.          . cyclobenzaprine (FLEXERIL) 10 MG tablet   Oral   Take 1 tablet (10 mg total) by mouth 3 (three) times daily as needed for  muscle spasms.   30 tablet   0   . DULoxetine (CYMBALTA) 60 MG capsule   Oral   Take 60 mg by mouth every morning.          . labetalol (NORMODYNE) 200 MG tablet   Oral   Take 200 mg by mouth 2 (two) times daily.         Marland Kitchen levETIRAcetam (KEPPRA) 500 MG tablet   Oral   Take 500-1,000 mg by mouth 2 (two) times daily. **Take one tablet daily in the morning and two tablets at bedtime**         . lubiprostone (AMITIZA) 8 MCG capsule   Oral   Take 8 mcg by mouth 2 (two) times daily with a meal.          . methocarbamol (ROBAXIN) 500 MG tablet   Oral   Take 1 tablet (500 mg total) by mouth 3 (three) times daily.   21 tablet   0   . promethazine (PHENERGAN) 25 MG suppository   Rectal   Place 1 suppository (25 mg total) rectally every 6 (six) hours as needed for nausea.   6 each   0   . promethazine (PHENERGAN) 25 MG tablet   Oral   Take 1 tablet (25 mg total) by mouth every 8 (eight) hours as needed for  nausea.   6 tablet   0     BP 152/100  Pulse 110  Temp(Src) 98.3 F (36.8 C) (Oral)  Resp 20  Ht 5' 4.5" (1.638 m)  Wt 200 lb (90.719 kg)  BMI 33.81 kg/m2  SpO2 98%  Physical Exam  Nursing note and vitals reviewed. Constitutional: She is oriented to person, place, and time. She appears well-developed and well-nourished. No distress.  HENT:  Head: Normocephalic and atraumatic.  Eyes: Conjunctivae and EOM are normal.  Cardiovascular: Normal rate and regular rhythm.   Pulmonary/Chest: Effort normal and breath sounds normal. No stridor. No respiratory distress.  Abdominal: Bowel sounds are decreased. There is tenderness in the periumbilical area, suprapubic area and left lower quadrant. There is guarding. There is no rigidity and no rebound.  Musculoskeletal: She exhibits no edema.  Neurological: She is alert and oriented to person, place, and time. No cranial nerve deficit.  Skin: Skin is warm and dry.  Psychiatric: She has a normal mood and affect.    ED Course  Procedures (including critical care time)  Labs Reviewed  CBC WITH DIFFERENTIAL  COMPREHENSIVE METABOLIC PANEL  LIPASE, BLOOD  URINALYSIS, ROUTINE W REFLEX MICROSCOPIC  PREGNANCY, URINE   No results found.   No diagnosis found.  On repeat exam the patient appears more comfortable.  MDM  This patient presents with ongoing abdominal pain, vomiting.  The patient's endorsement of a history of Crohn's, and her presentation with tachycardia, raises some suspicion for acute abdominal process.  The patient's CT scan did not demonstrate acute findings.  The patient's labs were largely reassuring.  There was a mild leukocytosis, but the patient is afebrile.  I discussed, at length, with the patient the need for primary care and gastroenterology followup, in part to insure appropriate ongoing care if the patient does have biopsy confirmed Crohn's disease.  Absent distress, the patient continued to have mild abdominal pain  she was appropriate for discharge.  Gerhard Munch, MD 02/12/13 2231

## 2013-02-16 ENCOUNTER — Emergency Department (HOSPITAL_COMMUNITY): Payer: Medicare Other

## 2013-02-16 ENCOUNTER — Encounter (HOSPITAL_COMMUNITY): Payer: Self-pay | Admitting: *Deleted

## 2013-02-16 ENCOUNTER — Emergency Department (HOSPITAL_COMMUNITY)
Admission: EM | Admit: 2013-02-16 | Discharge: 2013-02-16 | Disposition: A | Discharge status: Home / Self Care | Payer: Medicare Other | Attending: Emergency Medicine | Admitting: Emergency Medicine

## 2013-02-16 DIAGNOSIS — F319 Bipolar disorder, unspecified: Secondary | ICD-10-CM | POA: Insufficient documentation

## 2013-02-16 DIAGNOSIS — I10 Essential (primary) hypertension: Secondary | ICD-10-CM | POA: Insufficient documentation

## 2013-02-16 DIAGNOSIS — Z9089 Acquired absence of other organs: Secondary | ICD-10-CM | POA: Insufficient documentation

## 2013-02-16 DIAGNOSIS — Z8639 Personal history of other endocrine, nutritional and metabolic disease: Secondary | ICD-10-CM | POA: Insufficient documentation

## 2013-02-16 DIAGNOSIS — Z8719 Personal history of other diseases of the digestive system: Secondary | ICD-10-CM | POA: Insufficient documentation

## 2013-02-16 DIAGNOSIS — Z8669 Personal history of other diseases of the nervous system and sense organs: Secondary | ICD-10-CM | POA: Insufficient documentation

## 2013-02-16 DIAGNOSIS — Z9851 Tubal ligation status: Secondary | ICD-10-CM | POA: Insufficient documentation

## 2013-02-16 DIAGNOSIS — Z79899 Other long term (current) drug therapy: Secondary | ICD-10-CM | POA: Insufficient documentation

## 2013-02-16 DIAGNOSIS — G8929 Other chronic pain: Secondary | ICD-10-CM | POA: Insufficient documentation

## 2013-02-16 DIAGNOSIS — Z862 Personal history of diseases of the blood and blood-forming organs and certain disorders involving the immune mechanism: Secondary | ICD-10-CM | POA: Insufficient documentation

## 2013-02-16 DIAGNOSIS — G40909 Epilepsy, unspecified, not intractable, without status epilepticus: Secondary | ICD-10-CM | POA: Insufficient documentation

## 2013-02-16 DIAGNOSIS — R1084 Generalized abdominal pain: Secondary | ICD-10-CM | POA: Insufficient documentation

## 2013-02-16 DIAGNOSIS — F411 Generalized anxiety disorder: Secondary | ICD-10-CM | POA: Insufficient documentation

## 2013-02-16 DIAGNOSIS — R112 Nausea with vomiting, unspecified: Secondary | ICD-10-CM | POA: Insufficient documentation

## 2013-02-16 LAB — BASIC METABOLIC PANEL
BUN: 8 mg/dL (ref 6–23)
Calcium: 9.9 mg/dL (ref 8.4–10.5)
GFR calc Af Amer: >90 mL/min (ref >90)
GFR calc non Af Amer: 81 mL/min — ABNORMAL LOW (ref >90)
Glucose, Bld: 104 mg/dL — ABNORMAL HIGH (ref 70–99)
Potassium: 3.8 mEq/L (ref 3.5–5.1)
Sodium: 135 mEq/L (ref 135–145)

## 2013-02-16 LAB — URINALYSIS, ROUTINE W REFLEX MICROSCOPIC
Bilirubin Urine: NEGATIVE
Hgb urine dipstick: NEGATIVE
Nitrite: NEGATIVE
Protein, ur: NEGATIVE mg/dL
Specific Gravity, Urine: 1.015 (ref 1.005–1.030)
Urobilinogen, UA: 0.2 mg/dL (ref 0.0–1.0)

## 2013-02-16 LAB — CBC WITH DIFFERENTIAL/PLATELET
Basophils Relative: 0 % (ref 0–1)
Eosinophils Absolute: 0.2 10*3/uL (ref 0.0–0.7)
Eosinophils Relative: 2 % (ref 0–5)
Lymphs Abs: 2.7 10*3/uL (ref 0.7–4.0)
MCH: 26.9 pg (ref 26.0–34.0)
MCHC: 33.6 g/dL (ref 30.0–36.0)
MCV: 80 fL (ref 78.0–100.0)
Monocytes Relative: 6 % (ref 3–12)
Neutrophils Relative %: 70 % (ref 43–77)
Platelets: 264 10*3/uL (ref 150–400)
RBC: 4.16 MIL/uL (ref 3.87–5.11)

## 2013-02-16 LAB — URINE MICROSCOPIC-ADD ON

## 2013-02-16 MED ORDER — SODIUM CHLORIDE 0.9 % IV SOLN: INTRAVENOUS | Status: DC

## 2013-02-16 MED ORDER — MORPHINE SULFATE 4 MG/ML IJ SOLN: 4.0000 mg | Freq: Once | INTRAMUSCULAR | Status: DC

## 2013-02-16 MED ORDER — PROMETHAZINE HCL 25 MG/ML IJ SOLN: 25.0000 mg | Freq: Once | INTRAMUSCULAR | Status: DC

## 2013-02-16 MED ORDER — PROMETHAZINE HCL 25 MG/ML IJ SOLN
25.0000 mg | Freq: Once | INTRAMUSCULAR | Status: AC
  Administered 2013-02-16: 25 mg via INTRAMUSCULAR
  Filled 2013-02-16: qty 1

## 2013-02-16 MED ORDER — MORPHINE SULFATE 4 MG/ML IJ SOLN
4.0000 mg | Freq: Once | INTRAMUSCULAR | Status: AC
  Administered 2013-02-16: 4 mg via INTRAMUSCULAR
  Filled 2013-02-16: qty 1

## 2013-02-16 NOTE — ED Notes (Signed)
Pt requesting a script for pain meds. Spoke w/ EDP & advised he was not writing script for any pain meds from the ED. EDP had printed pt script history on pt.

## 2013-02-16 NOTE — ED Notes (Signed)
Pt reports lower abdominal again tonight was seen here 4 days ago for the same. Pt states she is vomiting now & was only nauseated last time.

## 2013-02-16 NOTE — ED Notes (Signed)
Pt alert & oriented x4, stable gait. Patient given discharge instructions, paperwork & prescription(s). Patient verbalized understanding. Pt left department wanting a pain script.

## 2013-02-16 NOTE — ED Notes (Signed)
abd pain,vomiting, no diarrhea,  Normal bm today.

## 2013-02-16 NOTE — ED Provider Notes (Signed)
History     CSN: [id number]  Arrival date & time 02/16/13  2046   First MD Initiated Contact with Patient 02/16/13 2110      Chief Complaint  Patient presents with  . Abdominal Pain    (Consider location/radiation/quality/duration/timing/severity/associated sxs/prior treatment) Patient is a 52 y.o. female presenting with abdominal pain. The history is provided by the patient and medical records. No language interpreter was used.  Abdominal Pain Pain location:  Generalized (Pt is a 51 yo woman with recurring bouts of mid abdominal pain.  Seen 4 days ago with full workup, counseled at that time to find a PCP to treat her for chronic abdominal pain.) Pain quality: cramping   Pain radiates to:  Does not radiate Pain severity:  Severe Onset quality:  Gradual Duration:  8 hours Timing:  Intermittent Progression:  Worsening Chronicity:  Chronic Context: previous surgery   Relieved by:  Nothing Worsened by:  Nothing tried Ineffective treatments:  None tried Associated symptoms: nausea and vomiting   Associated symptoms: no chills, no diarrhea and no fever     Past Medical History  Diagnosis Date  . Hypertension   . Bipolar 1 disorder   . Depression   . Hypercholesteremia   . Acid reflux   . Chronic pain   . Stomach ulcer   . Headache   . Anxiety   . Crohn's disease   . Seizures     Past Surgical History  Procedure Laterality Date  . Cholecystectomy    . Tonsillectomy    . Stomach surgery    . Tubal ligation      Family History  Problem Relation Age of Onset  . Hypertension Father   . Diabetes type II Brother   . Hypertension Brother   . Diabetes type II Mother     History  Substance Use Topics  . Smoking status: Never Smoker   . Smokeless tobacco: Not on file  . Alcohol Use: No    OB History   Grav Para Term Preterm Abortions TAB SAB Ect Mult Living                  Review of Systems  Constitutional: Negative.  Negative for fever and chills.   HENT: Negative.   Eyes: Negative.   Respiratory: Negative.   Cardiovascular: Negative.   Gastrointestinal: Positive for nausea, vomiting and abdominal pain. Negative for diarrhea.  Genitourinary: Negative.   Musculoskeletal: Negative.   Skin: Negative.   Psychiatric/Behavioral: Negative.     Allergies  Compazine; Penicillins; Aspirin; Cephalexin; Ketorolac tromethamine; Metoclopramide hcl; Ondansetron hcl; Sumatriptan; and Tramadol  Home Medications   Current Outpatient Rx  Name  Route  Sig  Dispense  Refill  . acetaminophen (TYLENOL) 500 MG tablet   Oral   Take 500-1,000 mg by mouth daily as needed. pain         . ARIPiprazole (ABILIFY) 10 MG tablet   Oral   Take 10 mg by mouth at bedtime.          . cloNIDine (CATAPRES) 0.1 MG tablet   Oral   Take 0.1 mg by mouth 2 (two) times daily.          . cyclobenzaprine (FLEXERIL) 10 MG tablet   Oral   Take 1 tablet (10 mg total) by mouth 3 (three) times daily as needed for muscle spasms.   30 tablet   0   . DULoxetine (CYMBALTA) 60 MG capsule   Oral   Take 60 mg  by mouth every morning.          . labetalol (NORMODYNE) 200 MG tablet   Oral   Take 200 mg by mouth 2 (two) times daily.         Marland Kitchen levETIRAcetam (KEPPRA) 500 MG tablet   Oral   Take 500-1,000 mg by mouth 2 (two) times daily. **Take one tablet daily in the morning and two tablets at bedtime**         . lubiprostone (AMITIZA) 8 MCG capsule   Oral   Take 8 mcg by mouth 2 (two) times daily with a meal.          . ondansetron (ZOFRAN ODT) 4 MG disintegrating tablet   Oral   Take 1 tablet (4 mg total) by mouth every 8 (eight) hours as needed for nausea.   20 tablet   0     BP 145/109  Pulse 102  Temp(Src) 98 F (36.7 C) (Oral)  Resp 20  Ht 5' 4.5" (1.638 m)  Wt 195 lb (88.451 kg)  BMI 32.97 kg/m2  SpO2 98%  Physical Exam  Nursing note and vitals reviewed. Constitutional: She is oriented to person, place, and time. She appears  well-developed and well-nourished. Distressed: in moderate distress.  HENT:  Head: Normocephalic and atraumatic.  Right Ear: External ear normal.  Left Ear: External ear normal.  Mouth/Throat: Oropharynx is clear and moist.  Eyes: Conjunctivae and EOM are normal. Pupils are equal, round, and reactive to light.  Neck: Normal range of motion. Neck supple.  Cardiovascular: Normal rate, regular rhythm and normal heart sounds.   Pulmonary/Chest: Effort normal and breath sounds normal.  Abdominal: Soft. Bowel sounds are normal.  Musculoskeletal: Normal range of motion.  Neurological: She is alert and oriented to person, place, and time.  No sensory or motor deficit.  Skin: Skin is warm and dry.  Psychiatric:  Tearful.    ED Course  Procedures (including critical care time)  Labs Reviewed  URINALYSIS, ROUTINE W REFLEX MICROSCOPIC  CBC WITH DIFFERENTIAL  BASIC METABOLIC PANEL  LIPASE, BLOOD  URINALYSIS, ROUTINE W REFLEX MICROSCOPIC   9:50 PM Pt seen --> physical exam performed.  Old charts reviewed.  Pt has had complete abdominal pain workup with CT abd/pelvis 4 days ago.  Review of [place] Controlled Substance database shows multiple narcotic prescriptions over the past year.  I advised her we would do a limited workup tonight to exclude abdominal pain requiring surgery, and that she would need to establish herself with a PCP to treat her for her chronic abdominal pain.   1. Abdominal pain          Carleene Cooper III, MD 02/20/13 [phone]

## 2013-02-16 NOTE — ED Provider Notes (Signed)
Patient signed out to me by Dr. Ignacia Palma. Patient with callus presents to the ED with similar complaints of abdominal pain. Reviewing her records reveals that she has had multiple prescriptions for narcotic analgesia given to her in the last 6 months. Her workup was once again within normal limits. She was discharged, asked to followup with primary care provider.  Gilda Crease, MD 02/16/13 2255

## 2013-02-18 ENCOUNTER — Encounter (HOSPITAL_COMMUNITY): Payer: Self-pay | Admitting: *Deleted

## 2013-02-18 ENCOUNTER — Other Ambulatory Visit: Payer: Self-pay

## 2013-02-18 ENCOUNTER — Emergency Department (HOSPITAL_COMMUNITY)
Admission: EM | Admit: 2013-02-18 | Discharge: 2013-02-18 | Disposition: A | Discharge status: Home / Self Care | Payer: Medicare Other | Attending: Emergency Medicine | Admitting: Emergency Medicine

## 2013-02-18 DIAGNOSIS — R109 Unspecified abdominal pain: Secondary | ICD-10-CM | POA: Insufficient documentation

## 2013-02-18 DIAGNOSIS — Z8669 Personal history of other diseases of the nervous system and sense organs: Secondary | ICD-10-CM | POA: Insufficient documentation

## 2013-02-18 DIAGNOSIS — Z8711 Personal history of peptic ulcer disease: Secondary | ICD-10-CM | POA: Insufficient documentation

## 2013-02-18 DIAGNOSIS — Z862 Personal history of diseases of the blood and blood-forming organs and certain disorders involving the immune mechanism: Secondary | ICD-10-CM | POA: Insufficient documentation

## 2013-02-18 DIAGNOSIS — Z8639 Personal history of other endocrine, nutritional and metabolic disease: Secondary | ICD-10-CM | POA: Insufficient documentation

## 2013-02-18 DIAGNOSIS — G8929 Other chronic pain: Secondary | ICD-10-CM | POA: Insufficient documentation

## 2013-02-18 DIAGNOSIS — Z79899 Other long term (current) drug therapy: Secondary | ICD-10-CM | POA: Insufficient documentation

## 2013-02-18 DIAGNOSIS — Z8719 Personal history of other diseases of the digestive system: Secondary | ICD-10-CM | POA: Insufficient documentation

## 2013-02-18 DIAGNOSIS — F319 Bipolar disorder, unspecified: Secondary | ICD-10-CM | POA: Insufficient documentation

## 2013-02-18 DIAGNOSIS — R1110 Vomiting, unspecified: Secondary | ICD-10-CM | POA: Insufficient documentation

## 2013-02-18 DIAGNOSIS — F411 Generalized anxiety disorder: Secondary | ICD-10-CM | POA: Insufficient documentation

## 2013-02-18 DIAGNOSIS — I10 Essential (primary) hypertension: Secondary | ICD-10-CM | POA: Insufficient documentation

## 2013-02-18 LAB — URINE CULTURE

## 2013-02-18 LAB — URINALYSIS, ROUTINE W REFLEX MICROSCOPIC
Bilirubin Urine: NEGATIVE
Ketones, ur: NEGATIVE mg/dL
Leukocytes, UA: NEGATIVE
Nitrite: NEGATIVE
Protein, ur: NEGATIVE mg/dL
Urobilinogen, UA: 0.2 mg/dL (ref 0.0–1.0)

## 2013-02-18 MED ORDER — OXYCODONE-ACETAMINOPHEN 5-325 MG PO TABS
2.0000 | ORAL_TABLET | Freq: Once | ORAL | Status: AC
  Administered 2013-02-18: 2 via ORAL
  Filled 2013-02-18: qty 2

## 2013-02-18 MED ORDER — PROMETHAZINE HCL 25 MG RE SUPP
RECTAL | Status: AC
  Filled 2013-02-18: qty 1

## 2013-02-18 MED ORDER — PROMETHAZINE HCL 25 MG RE SUPP
25.0000 mg | Freq: Once | RECTAL | Status: AC
  Administered 2013-02-18: 25 mg via RECTAL
  Filled 2013-02-18: qty 1

## 2013-02-18 NOTE — ED Notes (Signed)
Pt ambulated to restroom & returned to room w/ no complications. 

## 2013-02-18 NOTE — ED Notes (Signed)
Pt still reporting lower abdominal pain w/ nausea & some vomiting. Pt seen here 2 times this week.

## 2013-02-18 NOTE — ED Notes (Signed)
Pt reports lower abdominal pain w/ n/v. Pt has been seen here 2 other times this week.

## 2013-02-18 NOTE — ED Notes (Signed)
Pt already requesting more pain medication. Asking for something in shot form. Pt informed she was just given pain medication but it has not had time to work.

## 2013-02-18 NOTE — ED Notes (Signed)
Pt alert & oriented x4, stable gait. Patient given discharge instructions, paperwork & prescription(s). Patient  instructed to stop at the registration desk to finish any additional paperwork. Patient verbalized understanding. Pt left department w/ no further questions. 

## 2013-02-18 NOTE — ED Provider Notes (Signed)
History     CSN: [id number]  Arrival date & time 02/18/13  0204   First MD Initiated Contact with Patient 02/18/13 0214      Chief Complaint  Patient presents with  . Abdominal Pain    Patient is a 52 y.o. female presenting with abdominal pain. The history is provided by the patient.  Abdominal Pain Pain location:  Suprapubic Pain quality: sharp   Pain radiates to:  Does not radiate Pain severity:  Severe Onset quality:  Gradual Duration:  3 days Timing:  Constant Progression:  Worsening Chronicity:  Recurrent Relieved by:  Nothing Worsened by:  Palpation Associated symptoms: vomiting   Associated symptoms: no chest pain, no constipation, no diarrhea, no dysuria, no fever, no hematemesis, no hematochezia, no melena, no vaginal bleeding and no vaginal discharge   pt reports abdomimal pain for up to 3 days.  She reports h/o frequent abdominal pain in similar location but "this is worse"   Past Medical History  Diagnosis Date  . Hypertension   . Bipolar 1 disorder   . Depression   . Hypercholesteremia   . Acid reflux   . Chronic pain   . Stomach ulcer   . Headache   . Anxiety   . Crohn's disease   . Seizures     Past Surgical History  Procedure Laterality Date  . Cholecystectomy    . Tonsillectomy    . Stomach surgery    . Tubal ligation      Family History  Problem Relation Age of Onset  . Hypertension Father   . Diabetes type II Brother   . Hypertension Brother   . Diabetes type II Mother     History  Substance Use Topics  . Smoking status: Never Smoker   . Smokeless tobacco: Not on file  . Alcohol Use: No    OB History   Grav Para Term Preterm Abortions TAB SAB Ect Mult Living                  Review of Systems  Constitutional: Negative for fever.  Cardiovascular: Negative for chest pain.  Gastrointestinal: Positive for vomiting and abdominal pain. Negative for diarrhea, constipation, blood in stool, melena, hematochezia and hematemesis.   Genitourinary: Negative for dysuria, vaginal bleeding and vaginal discharge.  Neurological: Negative for weakness.  Psychiatric/Behavioral: The patient is nervous/anxious.   All other systems reviewed and are negative.    Allergies  Compazine; Penicillins; Aspirin; Cephalexin; Ketorolac tromethamine; Metoclopramide hcl; Ondansetron hcl; Sumatriptan; and Tramadol  Home Medications   Current Outpatient Rx  Name  Route  Sig  Dispense  Refill  . acetaminophen (TYLENOL) 500 MG tablet   Oral   Take 500-1,000 mg by mouth daily as needed. pain         . ARIPiprazole (ABILIFY) 10 MG tablet   Oral   Take 10 mg by mouth at bedtime.          . cloNIDine (CATAPRES) 0.1 MG tablet   Oral   Take 0.1 mg by mouth 2 (two) times daily.          . cyclobenzaprine (FLEXERIL) 10 MG tablet   Oral   Take 1 tablet (10 mg total) by mouth 3 (three) times daily as needed for muscle spasms.   30 tablet   0   . DULoxetine (CYMBALTA) 60 MG capsule   Oral   Take 60 mg by mouth every morning.          Marland Kitchen  labetalol (NORMODYNE) 200 MG tablet   Oral   Take 200 mg by mouth 2 (two) times daily.         Marland Kitchen levETIRAcetam (KEPPRA) 500 MG tablet   Oral   Take 500-1,000 mg by mouth 2 (two) times daily. **Take one tablet daily in the morning and two tablets at bedtime**         . lubiprostone (AMITIZA) 8 MCG capsule   Oral   Take 8 mcg by mouth 2 (two) times daily with a meal.          . ondansetron (ZOFRAN ODT) 4 MG disintegrating tablet   Oral   Take 1 tablet (4 mg total) by mouth every 8 (eight) hours as needed for nausea.   20 tablet   0     BP 101/74  Pulse 92  Temp(Src) 98.2 F (36.8 C) (Oral)  Resp 12  Ht 5' 4.5" (1.638 m)  Wt 195 lb (88.451 kg)  BMI 32.97 kg/m2  SpO2 97% BP 128/87  Pulse 88  Temp(Src) 98.2 F (36.8 C) (Oral)  Resp 12  Ht 5' 4.5" (1.638 m)  Wt 195 lb (88.451 kg)  BMI 32.97 kg/m2  SpO2 98%  Physical Exam CONSTITUTIONAL: Well developed/well  nourished HEAD: Normocephalic/atraumatic EYES: EOMI/PERRL, no icterus ENMT: Mucous membranes dry NECK: supple no meningeal signs SPINE:entire spine nontender CV: S1/S2 noted, no murmurs/rubs/gallops noted LUNGS: Lungs are clear to auscultation bilaterally, no apparent distress ABDOMEN: soft, mild suprapubic tenderness, no rebound or guarding No signs of peritonitis GU:no cva tenderness NEURO: Pt is awake/alert, moves all extremitiesx4 EXTREMITIES: pulses normal, full ROM SKIN: warm, color normal PSYCH - anxious  ED Course  Procedures  Medications  promethazine (PHENERGAN) suppository 25 mg (25 mg Rectal Given 02/18/13 0248)  oxyCODONE-acetaminophen (PERCOCET/ROXICET) 5-325 MG per tablet 2 tablet (2 tablets Oral Given 02/18/13 0343)     3:43 AM Pt with multiple ED visits for abdominal pain.  Her abdomen is soft with mild tenderness and no signs of peritonitis.  She is writhing in the bed on arrival to room, but she does calm when speaking to me.  She reports she is having difficulties finding a provider as outpatient, she reports she has not been able to see GI (records reveal she was discharged from local GI physician due to frequent no shows) Given antiemetics.  Will try PO pain med challenge and reassess   Pt tolerating PO  And no vomiting but still reports pain When distracted she has no abdominal pain and is conversant.  She is ambulatory without difficulty Suspect this is a chronic pain process (reports h/o crohn's disease but she denied bloody stools and no change in bowel habits) I do not suspect acute abdominal/vascular/gynecologic emergency She just had CT imaging on 3/16, do not feel labs/imaging need to be repeated at this time I advised her she will need to followup as outpatient for pain management MDM  Nursing notes including past medical history and social history reviewed and considered in documentation Previous records reviewed and considered Narcotic database  reviewed Labs/vital reviewed and considered        Date: 02/18/2013  Rate: 84  Rhythm: normal sinus rhythm  QRS Axis: normal  Intervals: normal  ST/T Wave abnormalities: normal  Conduction Disutrbances:none  Narrative Interpretation:   Old EKG Reviewed: unchanged    Joya Gaskins, MD 02/18/13 [phone]

## 2013-03-11 ENCOUNTER — Emergency Department (HOSPITAL_COMMUNITY)
Admission: EM | Admit: 2013-03-11 | Discharge: 2013-03-11 | Disposition: A | Discharge status: Home / Self Care | Payer: Medicare Other | Attending: Emergency Medicine | Admitting: Emergency Medicine

## 2013-03-11 ENCOUNTER — Encounter (HOSPITAL_COMMUNITY): Payer: Self-pay | Admitting: *Deleted

## 2013-03-11 DIAGNOSIS — G8929 Other chronic pain: Secondary | ICD-10-CM | POA: Insufficient documentation

## 2013-03-11 DIAGNOSIS — G40909 Epilepsy, unspecified, not intractable, without status epilepticus: Secondary | ICD-10-CM | POA: Insufficient documentation

## 2013-03-11 DIAGNOSIS — I10 Essential (primary) hypertension: Secondary | ICD-10-CM | POA: Insufficient documentation

## 2013-03-11 DIAGNOSIS — Z8719 Personal history of other diseases of the digestive system: Secondary | ICD-10-CM | POA: Insufficient documentation

## 2013-03-11 DIAGNOSIS — Z862 Personal history of diseases of the blood and blood-forming organs and certain disorders involving the immune mechanism: Secondary | ICD-10-CM | POA: Insufficient documentation

## 2013-03-11 DIAGNOSIS — Z8639 Personal history of other endocrine, nutritional and metabolic disease: Secondary | ICD-10-CM | POA: Insufficient documentation

## 2013-03-11 DIAGNOSIS — R110 Nausea: Secondary | ICD-10-CM | POA: Insufficient documentation

## 2013-03-11 DIAGNOSIS — Z79899 Other long term (current) drug therapy: Secondary | ICD-10-CM | POA: Insufficient documentation

## 2013-03-11 DIAGNOSIS — F319 Bipolar disorder, unspecified: Secondary | ICD-10-CM | POA: Insufficient documentation

## 2013-03-11 DIAGNOSIS — F411 Generalized anxiety disorder: Secondary | ICD-10-CM | POA: Insufficient documentation

## 2013-03-11 DIAGNOSIS — R109 Unspecified abdominal pain: Secondary | ICD-10-CM | POA: Insufficient documentation

## 2013-03-11 LAB — URINALYSIS, ROUTINE W REFLEX MICROSCOPIC
Hgb urine dipstick: NEGATIVE
Ketones, ur: NEGATIVE mg/dL
Protein, ur: NEGATIVE mg/dL
Urobilinogen, UA: 0.2 mg/dL (ref 0.0–1.0)

## 2013-03-11 MED ORDER — PROMETHAZINE HCL 12.5 MG PO TABS
25.0000 mg | ORAL_TABLET | Freq: Once | ORAL | Status: AC
  Administered 2013-03-11: 25 mg via ORAL
  Filled 2013-03-11: qty 2

## 2013-03-11 MED ORDER — OXYCODONE-ACETAMINOPHEN 5-325 MG PO TABS
1.0000 | ORAL_TABLET | Freq: Once | ORAL | Status: AC
  Administered 2013-03-11: 1 via ORAL
  Filled 2013-03-11: qty 1

## 2013-03-11 MED ORDER — ACETAMINOPHEN 325 MG PO TABS
650.0000 mg | ORAL_TABLET | Freq: Once | ORAL | Status: DC
  Filled 2013-03-11: qty 2

## 2013-03-11 NOTE — ED Notes (Signed)
C/o lower abd pain since yesterday, pt moaning in room, LBM today per pt and normal

## 2013-03-11 NOTE — ED Provider Notes (Signed)
History     CSN: [id number]  Arrival date & time 03/11/13  1253   First MD Initiated Contact with Patient 03/11/13 1344      Chief Complaint  Patient presents with  . Abdominal Pain    Patient is a 52 y.o. female presenting with abdominal pain. The history is provided by the patient.  Abdominal Pain Pain location:  Suprapubic Pain quality: sharp   Pain radiates to:  Does not radiate Pain severity:  Severe Onset quality:  Gradual Duration:  2 days Timing:  Constant Progression:  Worsening Chronicity:  Recurrent Relieved by:  Nothing Worsened by:  Nothing tried Ineffective treatments:  Acetaminophen Associated symptoms: nausea   Associated symptoms: no chest pain, no cough, no dysuria, no fever, no hematochezia, no melena, no shortness of breath, no vaginal bleeding and no vomiting     Past Medical History  Diagnosis Date  . Hypertension   . Bipolar 1 disorder   . Depression   . Hypercholesteremia   . Acid reflux   . Chronic pain   . Stomach ulcer   . Headache   . Anxiety   . Crohn's disease   . Seizures     Past Surgical History  Procedure Laterality Date  . Cholecystectomy    . Tonsillectomy    . Stomach surgery    . Tubal ligation      Family History  Problem Relation Age of Onset  . Hypertension Father   . Diabetes type II Brother   . Hypertension Brother   . Diabetes type II Mother     History  Substance Use Topics  . Smoking status: Never Smoker   . Smokeless tobacco: Not on file  . Alcohol Use: No    OB History   Grav Para Term Preterm Abortions TAB SAB Ect Mult Living                  Review of Systems  Constitutional: Negative for fever.  Respiratory: Negative for cough and shortness of breath.   Cardiovascular: Negative for chest pain.  Gastrointestinal: Positive for nausea and abdominal pain. Negative for vomiting, melena and hematochezia.  Genitourinary: Negative for dysuria and vaginal bleeding.  Neurological: Negative for  weakness.    Allergies  Compazine; Penicillins; Aspirin; Cephalexin; Ketorolac tromethamine; Metoclopramide hcl; Ondansetron hcl; Sumatriptan; and Tramadol  Home Medications   Current Outpatient Rx  Name  Route  Sig  Dispense  Refill  . acetaminophen (TYLENOL) 500 MG tablet   Oral   Take 500-1,000 mg by mouth daily as needed. pain         . ARIPiprazole (ABILIFY) 10 MG tablet   Oral   Take 10 mg by mouth at bedtime.          . cloNIDine (CATAPRES) 0.1 MG tablet   Oral   Take 0.1 mg by mouth 2 (two) times daily.          . DULoxetine (CYMBALTA) 60 MG capsule   Oral   Take 60 mg by mouth every morning.          . labetalol (NORMODYNE) 200 MG tablet   Oral   Take 200 mg by mouth 2 (two) times daily.         Marland Kitchen levETIRAcetam (KEPPRA) 500 MG tablet   Oral   Take 500-1,000 mg by mouth 2 (two) times daily. **Take one tablet daily in the morning and two tablets at bedtime**         .  lubiprostone (AMITIZA) 8 MCG capsule   Oral   Take 8 mcg by mouth 2 (two) times daily with a meal.            BP 127/107  Pulse 91  Temp(Src) 97.2 F (36.2 C) (Oral)  Resp 18  Ht 5\' 4"  (1.626 m)  Wt 197 lb (89.359 kg)  BMI 33.8 kg/m2  SpO2 100%  Physical Exam CONSTITUTIONAL: Well developed/well nourished, anxious HEAD: Normocephalic/atraumatic EYES: EOMI/PERRL, no icterus ENMT: Mucous membranes moist NECK: supple no meningeal signs SPINE:entire spine nontender CV: S1/S2 noted, no murmurs/rubs/gallops noted LUNGS: Lungs are clear to auscultation bilaterally, no apparent distress ABDOMEN: soft, mild suprapubic tenderness, no rebound or guarding GU:no cva tenderness NEURO: Pt is awake/alert, moves all extremitiesx4 EXTREMITIES: pulses normal, full ROM SKIN: warm, color normal PSYCH:anxious  ED Course  Procedures   Labs Reviewed  URINALYSIS, ROUTINE W REFLEX MICROSCOPIC - Abnormal; Notable for the following:    Specific Gravity, Urine >1.030 (*)    Bilirubin Urine  SMALL (*)    All other components within normal limits   Pt admits this is a recurring process.  When distracted her abdomen is soft without rigidity or focal tenderness My suspicion for acute abdominal process is low Advised need for outpatient management of her pain She has ride home today One percocet given today   MDM  Nursing notes including past medical history and social history reviewed and considered in documentation Previous records reviewed and considered - recent ED visits reviewed Labs/vital reviewed and considered        Joya Gaskins, MD 03/11/13 1538

## 2013-03-11 NOTE — ED Notes (Signed)
MD at bedside. 

## 2013-03-11 NOTE — ED Notes (Signed)
States she has taken tylenol and OTC med for stomach cramps

## 2013-03-11 NOTE — ED Notes (Addendum)
Mid-Abdominal pain w/nausea, no vomiting began 2 days ago.  9/10 sharp pain, constant w/intermittent escalation Chills, no diarrhea.  No dysuria, hematuria or hematachezia.

## 2013-03-23 ENCOUNTER — Emergency Department (HOSPITAL_COMMUNITY)
Admission: EM | Admit: 2013-03-23 | Discharge: 2013-03-24 | Disposition: A | Discharge status: Home / Self Care | Payer: Medicare Other | Attending: Emergency Medicine | Admitting: Emergency Medicine

## 2013-03-23 ENCOUNTER — Emergency Department (HOSPITAL_COMMUNITY): Payer: Medicare Other

## 2013-03-23 ENCOUNTER — Encounter (HOSPITAL_COMMUNITY): Payer: Self-pay | Admitting: *Deleted

## 2013-03-23 DIAGNOSIS — G40909 Epilepsy, unspecified, not intractable, without status epilepticus: Secondary | ICD-10-CM | POA: Insufficient documentation

## 2013-03-23 DIAGNOSIS — Z9851 Tubal ligation status: Secondary | ICD-10-CM | POA: Insufficient documentation

## 2013-03-23 DIAGNOSIS — Z862 Personal history of diseases of the blood and blood-forming organs and certain disorders involving the immune mechanism: Secondary | ICD-10-CM | POA: Insufficient documentation

## 2013-03-23 DIAGNOSIS — G8929 Other chronic pain: Secondary | ICD-10-CM | POA: Insufficient documentation

## 2013-03-23 DIAGNOSIS — Z8719 Personal history of other diseases of the digestive system: Secondary | ICD-10-CM | POA: Insufficient documentation

## 2013-03-23 DIAGNOSIS — Z79899 Other long term (current) drug therapy: Secondary | ICD-10-CM | POA: Insufficient documentation

## 2013-03-23 DIAGNOSIS — Z8639 Personal history of other endocrine, nutritional and metabolic disease: Secondary | ICD-10-CM | POA: Insufficient documentation

## 2013-03-23 DIAGNOSIS — Z9089 Acquired absence of other organs: Secondary | ICD-10-CM | POA: Insufficient documentation

## 2013-03-23 DIAGNOSIS — I10 Essential (primary) hypertension: Secondary | ICD-10-CM | POA: Insufficient documentation

## 2013-03-23 DIAGNOSIS — M549 Dorsalgia, unspecified: Secondary | ICD-10-CM | POA: Insufficient documentation

## 2013-03-23 DIAGNOSIS — F319 Bipolar disorder, unspecified: Secondary | ICD-10-CM | POA: Insufficient documentation

## 2013-03-23 DIAGNOSIS — K5900 Constipation, unspecified: Secondary | ICD-10-CM

## 2013-03-23 DIAGNOSIS — F411 Generalized anxiety disorder: Secondary | ICD-10-CM | POA: Insufficient documentation

## 2013-03-23 DIAGNOSIS — R110 Nausea: Secondary | ICD-10-CM | POA: Insufficient documentation

## 2013-03-23 DIAGNOSIS — Z880 Allergy status to penicillin: Secondary | ICD-10-CM | POA: Insufficient documentation

## 2013-03-23 DIAGNOSIS — Z9889 Other specified postprocedural states: Secondary | ICD-10-CM | POA: Insufficient documentation

## 2013-03-23 MED ORDER — PROMETHAZINE HCL 25 MG/ML IJ SOLN
12.5000 mg | Freq: Once | INTRAMUSCULAR | Status: AC
  Administered 2013-03-24: 12.5 mg via INTRAVENOUS
  Filled 2013-03-23: qty 1

## 2013-03-23 MED ORDER — MORPHINE SULFATE 4 MG/ML IJ SOLN
2.0000 mg | Freq: Once | INTRAMUSCULAR | Status: AC
  Administered 2013-03-24: 2 mg via INTRAVENOUS
  Filled 2013-03-23: qty 1

## 2013-03-23 MED ORDER — SODIUM CHLORIDE 0.9 % IV SOLN: Freq: Once | INTRAVENOUS | Status: DC

## 2013-03-23 MED ORDER — SODIUM CHLORIDE 0.9 % IV BOLUS (SEPSIS)
1000.0000 mL | Freq: Once | INTRAVENOUS | Status: AC
  Administered 2013-03-24: 1000 mL via INTRAVENOUS

## 2013-03-23 NOTE — ED Notes (Signed)
Mid abd pain since yesterday, nausea, no vomiting, no diarrhea.

## 2013-03-23 NOTE — ED Provider Notes (Signed)
History    This chart was scribed for [employer]. Colon Branch, MD by Marlyne Beards, ED Scribe. The patient was seen in room APA07/APA07. Patient's care was started at 11:16 PM.   CSN: [id number]  Arrival date & time 03/23/13  2210   First MD Initiated Contact with Patient 03/23/13 2316      Chief Complaint  Patient presents with  . Abdominal Pain    (Consider location/radiation/quality/duration/timing/severity/associated sxs/prior treatment) HPI [name] is a 52 y.o. female with h/o bipolar disorder, depression, crohn's disease, and HTN who presents to the Emergency Department complaining of moderate constant abdominal pain with associated nausea onset since yesterday. Pt states the pain is in her super pubic region of her abdomen. She states it came on gradually. Pt's last bowel movement was earlier this morning. Pt states she is currently taking prescribed Amitiza for her stomach and thas taken some otc anti gas medication with no immediate relief. Pt denies fever, chills, cough, vomiting, diarrhea, SOB, weakness, and any other associated symptoms.   Past Medical History  Diagnosis Date  . Hypertension   . Bipolar 1 disorder   . Depression   . Hypercholesteremia   . Acid reflux   . Chronic pain   . Stomach ulcer   . Headache   . Anxiety   . Crohn's disease   . Seizures     Past Surgical History  Procedure Laterality Date  . Cholecystectomy    . Tonsillectomy    . Stomach surgery    . Tubal ligation      Family History  Problem Relation Age of Onset  . Hypertension Father   . Diabetes type II Brother   . Hypertension Brother   . Diabetes type II Mother     History  Substance Use Topics  . Smoking status: Never Smoker   . Smokeless tobacco: Not on file  . Alcohol Use: No    OB History   Grav Para Term Preterm Abortions TAB SAB Ect Mult Living                  Review of Systems  Allergies  Compazine; Penicillins; Aspirin; Cephalexin; Ketorolac  tromethamine; Metoclopramide hcl; Ondansetron hcl; Sumatriptan; and Tramadol  Home Medications   Current Outpatient Rx  Name  Route  Sig  Dispense  Refill  . acetaminophen (TYLENOL) 500 MG tablet   Oral   Take 500-1,000 mg by mouth daily as needed. pain         . ARIPiprazole (ABILIFY) 10 MG tablet   Oral   Take 10 mg by mouth at bedtime.          . cloNIDine (CATAPRES) 0.1 MG tablet   Oral   Take 0.1 mg by mouth 2 (two) times daily.          . DULoxetine (CYMBALTA) 60 MG capsule   Oral   Take 60 mg by mouth every morning.          . labetalol (NORMODYNE) 200 MG tablet   Oral   Take 200 mg by mouth 2 (two) times daily.         Marland Kitchen levETIRAcetam (KEPPRA) 500 MG tablet   Oral   Take 500-1,000 mg by mouth 2 (two) times daily. **Take one tablet daily in the morning and two tablets at bedtime**         . lubiprostone (AMITIZA) 8 MCG capsule   Oral   Take 8 mcg by mouth 2 (two) times daily  with a meal.            BP 152/113  Pulse 90  Temp(Src) 98 F (36.7 C) (Oral)  Resp 20  Ht 5\' 4"  (1.626 m)  Wt 190 lb (86.183 kg)  BMI 32.6 kg/m2  SpO2 99%  Physical Exam  Nursing note and vitals reviewed. Constitutional: She is oriented to person, place, and time. She appears well-developed and well-nourished. No distress.  HENT:  Head: Normocephalic and atraumatic.  Eyes: EOM are normal.  Neck: Neck supple. No tracheal deviation present.  Cardiovascular: Normal rate.   Pulmonary/Chest: Effort normal. No respiratory distress.  Abdominal: Soft. Bowel sounds are normal. There is tenderness.  super pubic tenderness upon palpation.  Musculoskeletal: Normal range of motion.  Neurological: She is alert and oriented to person, place, and time.  Skin: Skin is warm and dry.  Psychiatric: She has a normal mood and affect. Her behavior is normal.    ED Course  Procedures (including critical care time) DIAGNOSTIC STUDIES: Oxygen Saturation is 99% on room air, normal by  my interpretation.    COORDINATION OF CARE:  11:32 PM Discussed ED treatment with pt and pt agrees.   Dg Abd Acute W/chest  03/24/2013  *RADIOLOGY REPORT*  Clinical Data: Lower abdominal pain.  Nausea vomiting. Hypertension.  History of Crohn's disease.  ACUTE ABDOMEN SERIES (ABDOMEN 2 VIEW & CHEST 1 VIEW)  Comparison: 02/16/2013 and CT of 02/12/2013.  Findings: Frontal view of the chest demonstrates midline trachea. Normal heart size.  No pleural effusion or pneumothorax.  Linear scarring radiating from the right hilum.  Left lung clear.  Abominal films demonstrate no free intraperitoneal or air fluid levels on upright positioning.  Extensive surgical changes in the upper abdomen.  Moderate amount of stool throughout the colon on supine imaging. No small bowel dilatation.  Distal gas identified. No abnormal abdominal calcifications.   No appendicolith.  IMPRESSION: No acute findings.  Possible constipation.   Original Report Authenticated By: Jeronimo Greaves, M.D.        MDM  Patient with back pain and lower abdominal pain. Given morphine and zofran. Acute abdominal series shows stool and gas in the colon. Reviewed results with the pateint. Gave her copies of the films. Pt stable in ED with no significant deterioration in condition.The patient appears reasonably screened and/or stabilized for discharge and I doubt any other medical condition or other [facility] requiring further screening, evaluation, or treatment in the ED at this time prior to discharge.  I personally performed the services described in this documentation, which was scribed in my presence. The recorded information has been reviewed and considered.   MDM Reviewed: nursing note and vitals Interpretation: x-ray             Nicoletta Dress. Colon Branch, MD 03/24/13 [phone]

## 2013-03-24 MED ORDER — DIPHENHYDRAMINE HCL 50 MG/ML IJ SOLN
25.0000 mg | Freq: Once | INTRAMUSCULAR | Status: AC
  Administered 2013-03-24: 25 mg via INTRAVENOUS
  Filled 2013-03-24: qty 1

## 2013-04-24 ENCOUNTER — Emergency Department (HOSPITAL_COMMUNITY): Payer: Medicare Other

## 2013-04-24 ENCOUNTER — Emergency Department (HOSPITAL_COMMUNITY)
Admission: EM | Admit: 2013-04-24 | Discharge: 2013-04-25 | Disposition: A | Discharge status: Home / Self Care | Payer: Medicare Other | Attending: Emergency Medicine | Admitting: Emergency Medicine

## 2013-04-24 ENCOUNTER — Encounter (HOSPITAL_COMMUNITY): Payer: Self-pay | Admitting: Emergency Medicine

## 2013-04-24 DIAGNOSIS — I10 Essential (primary) hypertension: Secondary | ICD-10-CM | POA: Insufficient documentation

## 2013-04-24 DIAGNOSIS — R0602 Shortness of breath: Secondary | ICD-10-CM | POA: Insufficient documentation

## 2013-04-24 DIAGNOSIS — Z8711 Personal history of peptic ulcer disease: Secondary | ICD-10-CM | POA: Insufficient documentation

## 2013-04-24 DIAGNOSIS — E7800 Pure hypercholesterolemia, unspecified: Secondary | ICD-10-CM | POA: Insufficient documentation

## 2013-04-24 DIAGNOSIS — R1110 Vomiting, unspecified: Secondary | ICD-10-CM | POA: Insufficient documentation

## 2013-04-24 DIAGNOSIS — Z8719 Personal history of other diseases of the digestive system: Secondary | ICD-10-CM | POA: Insufficient documentation

## 2013-04-24 DIAGNOSIS — R079 Chest pain, unspecified: Secondary | ICD-10-CM

## 2013-04-24 DIAGNOSIS — G40909 Epilepsy, unspecified, not intractable, without status epilepticus: Secondary | ICD-10-CM | POA: Insufficient documentation

## 2013-04-24 DIAGNOSIS — R61 Generalized hyperhidrosis: Secondary | ICD-10-CM | POA: Insufficient documentation

## 2013-04-24 DIAGNOSIS — F411 Generalized anxiety disorder: Secondary | ICD-10-CM | POA: Insufficient documentation

## 2013-04-24 DIAGNOSIS — R0789 Other chest pain: Secondary | ICD-10-CM | POA: Insufficient documentation

## 2013-04-24 DIAGNOSIS — E669 Obesity, unspecified: Secondary | ICD-10-CM | POA: Insufficient documentation

## 2013-04-24 DIAGNOSIS — G8929 Other chronic pain: Secondary | ICD-10-CM | POA: Insufficient documentation

## 2013-04-24 DIAGNOSIS — Z79899 Other long term (current) drug therapy: Secondary | ICD-10-CM | POA: Insufficient documentation

## 2013-04-24 DIAGNOSIS — F319 Bipolar disorder, unspecified: Secondary | ICD-10-CM | POA: Insufficient documentation

## 2013-04-24 DIAGNOSIS — Z880 Allergy status to penicillin: Secondary | ICD-10-CM | POA: Insufficient documentation

## 2013-04-24 LAB — COMPREHENSIVE METABOLIC PANEL
ALT: 24 U/L (ref 0–35)
Albumin: 3.9 g/dL (ref 3.5–5.2)
BUN: 10 mg/dL (ref 6–23)
Calcium: 9.6 mg/dL (ref 8.4–10.5)
GFR calc Af Amer: 65 mL/min — ABNORMAL LOW (ref >90)
Glucose, Bld: 93 mg/dL (ref 70–99)
Sodium: 137 mEq/L (ref 135–145)
Total Protein: 7.1 g/dL (ref 6.0–8.3)

## 2013-04-24 LAB — CBC WITH DIFFERENTIAL/PLATELET
Basophils Relative: 1 % (ref 0–1)
Eosinophils Absolute: 0.2 10*3/uL (ref 0.0–0.7)
Eosinophils Relative: 2 % (ref 0–5)
Lymphs Abs: 4.5 10*3/uL — ABNORMAL HIGH (ref 0.7–4.0)
MCH: 27.2 pg (ref 26.0–34.0)
MCHC: 33.4 g/dL (ref 30.0–36.0)
MCV: 81.4 fL (ref 78.0–100.0)
Platelets: 241 10*3/uL (ref 150–400)
RBC: 4.15 MIL/uL (ref 3.87–5.11)
RDW: 15.1 % (ref 11.5–15.5)

## 2013-04-24 LAB — TROPONIN I: Troponin I: <0.3 ng/mL (ref <0.30)

## 2013-04-24 MED ORDER — FENTANYL CITRATE 0.05 MG/ML IJ SOLN
100.0000 ug | Freq: Once | INTRAMUSCULAR | Status: AC
  Administered 2013-04-24: 100 ug via INTRAVENOUS
  Filled 2013-04-24: qty 2

## 2013-04-24 NOTE — ED Notes (Signed)
Patient complaining of mid-sternal chest pain starting approximately 1 hour ago.

## 2013-04-24 NOTE — ED Provider Notes (Signed)
History  This chart was scribed for Donnetta Hutching, MD by Bennett Scrape, ED Scribe. This patient was seen in room APA04/APA04 and the patient's care was started at 11:05 PM.  CSN: [id number]  Arrival date & time 04/24/13  2145   First MD Initiated Contact with Patient 04/24/13 2305      Chief Complaint  Patient presents with  . Chest Pain     The history is provided by the patient. No language interpreter was used.   HPI Comments: [name] is a 52 y.o. female who presents to the Emergency Department complaining of 6 hours mid-sternum CP with associated SOB, diaphoresis and one episode of emesis that started while watching TV. The pain and SOB are currently mild. She denies being on ASA currently. She also c/o bilateral ankle burning but denies leg swelling and abdominal pain as associated symptoms. She has a h/o HTN controlled by medication, bipolar disorder, anxiety, depression, and partial gastrectomy for bleeding ulcers. Pt denies smoking and is a former alcohol use.    Past Medical History  Diagnosis Date  . Hypertension   . Bipolar 1 disorder   . Depression   . Hypercholesteremia   . Acid reflux   . Chronic pain   . Stomach ulcer   . Headache   . Anxiety   . Crohn's disease   . Seizures     Past Surgical History  Procedure Laterality Date  . Cholecystectomy    . Tonsillectomy    . Stomach surgery    . Tubal ligation      Family History  Problem Relation Age of Onset  . Hypertension Father   . Diabetes type II Brother   . Hypertension Brother   . Diabetes type II Mother     History  Substance Use Topics  . Smoking status: Never Smoker   . Smokeless tobacco: Not on file  . Alcohol Use: No    No OB history provided.  Review of Systems  A complete 10 system review of systems was obtained and all systems are negative except as noted in the HPI and PMH.   Allergies  Compazine; Penicillins; Aspirin; Cephalexin; Ketorolac tromethamine; Metoclopramide  hcl; Ondansetron hcl; Sumatriptan; and Tramadol  Home Medications   Current Outpatient Rx  Name  Route  Sig  Dispense  Refill  . acetaminophen (TYLENOL) 500 MG tablet   Oral   Take 500-1,000 mg by mouth daily as needed. pain         . cloNIDine (CATAPRES) 0.1 MG tablet   Oral   Take 0.1 mg by mouth 2 (two) times daily.          . DULoxetine (CYMBALTA) 60 MG capsule   Oral   Take 60 mg by mouth every morning.          . labetalol (NORMODYNE) 200 MG tablet   Oral   Take 200 mg by mouth 2 (two) times daily.         Marland Kitchen levETIRAcetam (KEPPRA) 500 MG tablet   Oral   Take 500-1,000 mg by mouth 2 (two) times daily. **Take one tablet daily in the morning and two tablets at bedtime**         . lubiprostone (AMITIZA) 8 MCG capsule   Oral   Take 8 mcg by mouth 2 (two) times daily with a meal.            Triage Vitals: BP 96/54  Pulse 115  Temp(Src) 97.7 F (36.5 C) (  Oral)  Resp 24  Ht 5' 4.5" (1.638 m)  Wt 185 lb (83.915 kg)  BMI 31.28 kg/m2  SpO2 96%  Physical Exam  Nursing note and vitals reviewed. Constitutional: She is oriented to person, place, and time. She appears well-developed and well-nourished. No distress.  Obese   HENT:  Head: Normocephalic and atraumatic.  Eyes: Conjunctivae and EOM are normal. Pupils are equal, round, and reactive to light.  Neck: Normal range of motion. Neck supple. No tracheal deviation present.  Cardiovascular: Normal rate, regular rhythm and normal heart sounds.   Pulmonary/Chest: Effort normal and breath sounds normal. No respiratory distress.  Abdominal: Soft. Bowel sounds are normal.  Musculoskeletal: Normal range of motion.  Neurological: She is alert and oriented to person, place, and time.  Skin: Skin is warm and dry.  Psychiatric: She has a normal mood and affect. Her behavior is normal.    ED Course  Procedures (including critical care time)  DIAGNOSTIC STUDIES: Oxygen Saturation is 96% on room air, normal by my  interpretation.    COORDINATION OF CARE: 11:12 PM-Discussed treatment plan which includes EKG, CXR, CBC panel, CMP and UA with pt at bedside and pt agreed to plan.   Labs Reviewed  CBC WITH DIFFERENTIAL - Abnormal; Notable for the following:    Hemoglobin 11.3 (*)    HCT 33.8 (*)    Lymphs Abs 4.5 (*)    All other components within normal limits  COMPREHENSIVE METABOLIC PANEL - Abnormal; Notable for the following:    Potassium 3.4 (*)    Total Bilirubin 0.1 (*)    GFR calc non Af Amer 56 (*)    GFR calc Af Amer 65 (*)    All other components within normal limits  URINALYSIS, ROUTINE W REFLEX MICROSCOPIC - Abnormal; Notable for the following:    Specific Gravity, Urine <1.005 (*)    Leukocytes, UA MODERATE (*)    All other components within normal limits  TROPONIN I  D-DIMER, QUANTITATIVE  URINE RAPID DRUG SCREEN (HOSP PERFORMED)  URINE MICROSCOPIC-ADD ON    Dg Chest 1 View  04/24/2013   *RADIOLOGY REPORT*  Clinical Data: Chest pain; legs and feet twitching and moving.  CHEST - 1 VIEW  Comparison: Chest radiograph performed 03/24/2013  Findings: The lungs are well-aerated and clear.  There is no evidence of focal opacification, pleural effusion or pneumothorax.  The cardiomediastinal silhouette is within normal limits.  No acute osseous abnormalities are seen.  IMPRESSION: No acute cardiopulmonary process seen.   Original Report Authenticated By: Tonia Ghent, M.D.   No results found.   No diagnosis found.   Date: 04/25/2013  Rate: 111  Rhythm: sinus tachy  QRS Axis: normal  Intervals: normal  ST/T Wave abnormalities: normal  Conduction Disutrbances: none  Narrative Interpretation: unremarkable    Date: 04/25/2013    03:31  Rate: 83  Rhythm: normal sinus rhythm  QRS Axis: normal  Intervals: normal  ST/T Wave abnormalities: normal  Conduction Disutrbances: none  Narrative Interpretation: unremarkable     MDM  Screening tests show no acute pathology.  Patient  had been hypotensive. She responded to fluid bolus'. She is nontoxic. No clinical evidence of sepsis. the patient has primary care followup      I personally performed the services described in this documentation, which was scribed in my presence. The recorded information has been reviewed and is accurate.    Donnetta Hutching, MD 04/25/13 0630

## 2013-04-25 LAB — URINALYSIS, ROUTINE W REFLEX MICROSCOPIC
Glucose, UA: NEGATIVE mg/dL
Hgb urine dipstick: NEGATIVE
Nitrite: NEGATIVE
Protein, ur: NEGATIVE mg/dL
Specific Gravity, Urine: <1.005 — ABNORMAL LOW (ref 1.005–1.030)

## 2013-04-25 LAB — URINE MICROSCOPIC-ADD ON

## 2013-04-25 LAB — RAPID URINE DRUG SCREEN, HOSP PERFORMED
Amphetamines: NOT DETECTED
Barbiturates: NOT DETECTED
Benzodiazepines: NOT DETECTED

## 2013-04-25 MED ORDER — SODIUM CHLORIDE 0.9 % IV SOLN
Freq: Once | INTRAVENOUS | Status: AC
  Administered 2013-04-25: 02:00:00 via INTRAVENOUS

## 2013-04-25 MED ORDER — SODIUM CHLORIDE 0.9 % IV SOLN
Freq: Once | INTRAVENOUS | Status: AC
  Administered 2013-04-25: 05:00:00 via INTRAVENOUS

## 2013-04-25 MED ORDER — GI COCKTAIL ~~LOC~~
30.0000 mL | Freq: Once | ORAL | Status: AC
  Administered 2013-04-25: 30 mL via ORAL
  Filled 2013-04-25: qty 30

## 2013-04-25 MED ORDER — SODIUM CHLORIDE 0.9 % IV SOLN
Freq: Once | INTRAVENOUS | Status: AC
  Administered 2013-04-25: 04:00:00 via INTRAVENOUS

## 2013-04-25 NOTE — ED Notes (Signed)
Patient states "my ride is ready to leave because he has to work in the morning. Will you tell the doctor I'm not hurting anymore and I am ready to go?" Advised MD.

## 2013-04-25 NOTE — ED Notes (Signed)
Patient consumed 3 cups of water. Patient stating she wants to leave AMA due to her ride needing to go home. Patient's B/P is 63/25 at this time. Advised patient of dangers of low B/P. Advised MD.

## 2013-04-25 NOTE — ED Notes (Signed)
Significant other with patient stated he could come back in the morning and give patient a ride home. Patient attempted to stand and was dizzy and had to sit back down immediately. Patient agreed to stay and allow [place] to continue to treat her. Patient placed back on monitor. Ride to call is Joelyn Oms at [phone].

## 2013-04-25 NOTE — ED Notes (Signed)
Patient advised that her B/P is low. MD aware. Per MD advised patient to drink some fluids and we will recheck B/P in 30 minutes. Patient verbalized understanding.

## 2013-04-25 NOTE — ED Notes (Signed)
Patient is currently lying in bed on cardiac monitor with IV fluids infusing. Patient requested to turn off lights and shut door stating "I am going to try to sleep."

## 2013-06-04 ENCOUNTER — Encounter (HOSPITAL_COMMUNITY): Payer: Self-pay | Admitting: Emergency Medicine

## 2013-06-04 ENCOUNTER — Emergency Department (HOSPITAL_COMMUNITY)
Admission: EM | Admit: 2013-06-04 | Discharge: 2013-06-04 | Disposition: A | Discharge status: Home / Self Care | Payer: Medicare Other | Attending: Emergency Medicine | Admitting: Emergency Medicine

## 2013-06-04 DIAGNOSIS — Z880 Allergy status to penicillin: Secondary | ICD-10-CM | POA: Insufficient documentation

## 2013-06-04 DIAGNOSIS — F319 Bipolar disorder, unspecified: Secondary | ICD-10-CM | POA: Insufficient documentation

## 2013-06-04 DIAGNOSIS — G43909 Migraine, unspecified, not intractable, without status migrainosus: Secondary | ICD-10-CM | POA: Insufficient documentation

## 2013-06-04 DIAGNOSIS — F411 Generalized anxiety disorder: Secondary | ICD-10-CM | POA: Insufficient documentation

## 2013-06-04 DIAGNOSIS — M2550 Pain in unspecified joint: Secondary | ICD-10-CM | POA: Insufficient documentation

## 2013-06-04 DIAGNOSIS — Z8719 Personal history of other diseases of the digestive system: Secondary | ICD-10-CM | POA: Insufficient documentation

## 2013-06-04 DIAGNOSIS — Z8639 Personal history of other endocrine, nutritional and metabolic disease: Secondary | ICD-10-CM | POA: Insufficient documentation

## 2013-06-04 DIAGNOSIS — Z862 Personal history of diseases of the blood and blood-forming organs and certain disorders involving the immune mechanism: Secondary | ICD-10-CM | POA: Insufficient documentation

## 2013-06-04 DIAGNOSIS — H53149 Visual discomfort, unspecified: Secondary | ICD-10-CM | POA: Insufficient documentation

## 2013-06-04 DIAGNOSIS — Z79899 Other long term (current) drug therapy: Secondary | ICD-10-CM | POA: Insufficient documentation

## 2013-06-04 DIAGNOSIS — I10 Essential (primary) hypertension: Secondary | ICD-10-CM | POA: Insufficient documentation

## 2013-06-04 DIAGNOSIS — R569 Unspecified convulsions: Secondary | ICD-10-CM | POA: Insufficient documentation

## 2013-06-04 DIAGNOSIS — R21 Rash and other nonspecific skin eruption: Secondary | ICD-10-CM | POA: Insufficient documentation

## 2013-06-04 DIAGNOSIS — G8929 Other chronic pain: Secondary | ICD-10-CM | POA: Insufficient documentation

## 2013-06-04 DIAGNOSIS — H538 Other visual disturbances: Secondary | ICD-10-CM | POA: Insufficient documentation

## 2013-06-04 HISTORY — DX: Migraine, unspecified, not intractable, without status migrainosus: G43.909

## 2013-06-04 MED ORDER — SODIUM CHLORIDE 0.9 % IV SOLN
INTRAVENOUS | Status: DC
  Administered 2013-06-04: 22:00:00 via INTRAVENOUS

## 2013-06-04 MED ORDER — DEXAMETHASONE SODIUM PHOSPHATE 4 MG/ML IJ SOLN
10.0000 mg | Freq: Once | INTRAMUSCULAR | Status: AC
Start: 2013-06-04 — End: 2013-06-04
  Administered 2013-06-04: 10 mg via INTRAVENOUS
  Filled 2013-06-04: qty 3

## 2013-06-04 MED ORDER — HYDROMORPHONE HCL PF 1 MG/ML IJ SOLN
1.0000 mg | Freq: Once | INTRAMUSCULAR | Status: AC
  Administered 2013-06-04: 1 mg via INTRAVENOUS
  Filled 2013-06-04: qty 1

## 2013-06-04 MED ORDER — DIPHENHYDRAMINE HCL 50 MG/ML IJ SOLN
25.0000 mg | Freq: Once | INTRAMUSCULAR | Status: AC
Start: 2013-06-04 — End: 2013-06-04
  Administered 2013-06-04: 25 mg via INTRAVENOUS
  Filled 2013-06-04: qty 1

## 2013-06-04 MED ORDER — SODIUM CHLORIDE 0.9 % IV BOLUS (SEPSIS)
1000.0000 mL | Freq: Once | INTRAVENOUS | Status: AC
  Administered 2013-06-04: 1000 mL via INTRAVENOUS

## 2013-06-04 MED ORDER — PROMETHAZINE HCL 25 MG/ML IJ SOLN
12.5000 mg | Freq: Once | INTRAMUSCULAR | Status: AC
  Administered 2013-06-04: 12.5 mg via INTRAVENOUS
  Filled 2013-06-04: qty 1

## 2013-06-04 NOTE — ED Notes (Signed)
Pt reporting headache for past two days.  Worse today.  Has been treating with Tylenol with no relief.  Reports nausea began today. Pt reporting sensitivity to light and sound.

## 2013-06-04 NOTE — ED Provider Notes (Signed)
History  This chart was scribed for Shelda Jakes, MD, by Candelaria Stagers, ED Scribe. This patient was seen in room APA05/APA05 and the patient's care was started at 8:11 PM  CSN: [id number] Arrival date & time 06/04/13  1742  First MD Initiated Contact with Patient 06/04/13 1932     Chief Complaint  Patient presents with  . Migraine    The history is provided by the patient and medical records. No language interpreter was used.   HPI Comments: [name] is a 52 y.o. female who presents to the Emergency Department complaining of a bilateral temporal and frontal throbbing headache that started yesterday and has gradually worsened.  She describes the pain as 10/10.  Pt is also experiencing associated nausea.  She denies vomiting, fever, chills.  Pt has h/o migraines and states today's headache feels similar.  She typically takes dilaudid for migraines with relief.     Past Medical History  Diagnosis Date  . Hypertension   . Bipolar 1 disorder   . Depression   . Hypercholesteremia   . Acid reflux   . Chronic pain   . Stomach ulcer   . Headache(784.0)   . Anxiety   . Crohn's disease   . Seizures   . Migraine    Past Surgical History  Procedure Laterality Date  . Cholecystectomy    . Tonsillectomy    . Stomach surgery    . Tubal ligation     Family History  Problem Relation Age of Onset  . Hypertension Father   . Diabetes type II Brother   . Hypertension Brother   . Diabetes type II Mother    History  Substance Use Topics  . Smoking status: Never Smoker   . Smokeless tobacco: Not on file  . Alcohol Use: No   OB History   Grav Para Term Preterm Abortions TAB SAB Ect Mult Living                 Review of Systems  Constitutional: Negative for fever and chills.  HENT: Negative for congestion, sore throat, rhinorrhea and neck stiffness.   Eyes: Positive for photophobia and visual disturbance (blurred vision).  Respiratory: Negative for shortness of  breath.   Cardiovascular: Negative for leg swelling.  Gastrointestinal: Negative for nausea, vomiting, abdominal pain and diarrhea.  Genitourinary: Negative for dysuria.  Musculoskeletal: Positive for arthralgias. Negative for back pain.  Skin: Positive for rash.  Hematological: Does not bruise/bleed easily.  Psychiatric/Behavioral: Negative for confusion.    Allergies  Compazine; Penicillins; Aspirin; Cephalexin; Ketorolac tromethamine; Metoclopramide hcl; Ondansetron hcl; Sumatriptan; and Tramadol  Home Medications   Current Outpatient Rx  Name  Route  Sig  Dispense  Refill  . acetaminophen (TYLENOL) 500 MG tablet   Oral   Take 500-1,000 mg by mouth daily as needed. pain         . cloNIDine (CATAPRES) 0.1 MG tablet   Oral   Take 0.1 mg by mouth 2 (two) times daily.          . DULoxetine (CYMBALTA) 60 MG capsule   Oral   Take 60 mg by mouth every morning.          . labetalol (NORMODYNE) 200 MG tablet   Oral   Take 200 mg by mouth 2 (two) times daily.         Marland Kitchen levETIRAcetam (KEPPRA) 500 MG tablet   Oral   Take 500-1,000 mg by mouth 2 (two) times daily. **  Take one tablet daily in the morning and two tablets at bedtime**         . lubiprostone (AMITIZA) 8 MCG capsule   Oral   Take 8 mcg by mouth 2 (two) times daily with a meal.           BP 172/90  Pulse 100  Temp(Src) 98.1 F (36.7 C) (Oral)  Resp 18  SpO2 100% Physical Exam  Nursing note and vitals reviewed. Constitutional: She is oriented to person, place, and time. She appears well-developed and well-nourished. No distress.  HENT:  Head: Normocephalic and atraumatic.  Eyes: EOM are normal.  Neck: Normal range of motion.  Cardiovascular: Normal rate and regular rhythm.   No murmur heard. Pulmonary/Chest: Effort normal and breath sounds normal. She has no wheezes. She has no rales.  Abdominal: Bowel sounds are normal. There is no tenderness.  Musculoskeletal: Normal range of motion. She  exhibits no edema.  Right hand swollen.  No warmth noted.    Neurological: She is alert and oriented to person, place, and time. No cranial nerve deficit. Coordination normal.  Skin: Skin is warm and dry. She is not diaphoretic.  Psychiatric: She has a normal mood and affect. Her behavior is normal.    ED Course  Procedures  DIAGNOSTIC STUDIES: Oxygen Saturation is 100% on room air, normal by my interpretation.    COORDINATION OF CARE:  8:21 PM Discussed course of care with pt which includes pain medication.  Pt understands and agrees.   Labs Reviewed - No data to display No results found. 1. Migraine     MDM   Patient with history of migraines. Seedlings headache is consistent with her migraines. Improved with hydromorphone Benadryl Decadron and Phenergan. Headache is completely resolved. Patient we discharged home.    I personally performed the services described in this documentation, which was scribed in my presence. The recorded information has been reviewed and is accurate.     Shelda Jakes, MD 06/04/13 [phone]

## 2013-06-04 NOTE — ED Notes (Signed)
Pt c/o migraine since yesterday. N/v x 3. Nad. Mm wet. C/o some dizziness

## 2013-06-15 ENCOUNTER — Encounter (HOSPITAL_COMMUNITY): Payer: Self-pay | Admitting: *Deleted

## 2013-06-15 ENCOUNTER — Emergency Department (HOSPITAL_COMMUNITY)
Admission: EM | Admit: 2013-06-15 | Discharge: 2013-06-15 | Disposition: A | Discharge status: Home / Self Care | Payer: Medicare Other | Attending: Emergency Medicine | Admitting: Emergency Medicine

## 2013-06-15 DIAGNOSIS — F319 Bipolar disorder, unspecified: Secondary | ICD-10-CM | POA: Insufficient documentation

## 2013-06-15 DIAGNOSIS — Z880 Allergy status to penicillin: Secondary | ICD-10-CM | POA: Insufficient documentation

## 2013-06-15 DIAGNOSIS — Z8719 Personal history of other diseases of the digestive system: Secondary | ICD-10-CM | POA: Insufficient documentation

## 2013-06-15 DIAGNOSIS — Z8639 Personal history of other endocrine, nutritional and metabolic disease: Secondary | ICD-10-CM | POA: Insufficient documentation

## 2013-06-15 DIAGNOSIS — Z79899 Other long term (current) drug therapy: Secondary | ICD-10-CM | POA: Insufficient documentation

## 2013-06-15 DIAGNOSIS — G40909 Epilepsy, unspecified, not intractable, without status epilepticus: Secondary | ICD-10-CM | POA: Insufficient documentation

## 2013-06-15 DIAGNOSIS — G43909 Migraine, unspecified, not intractable, without status migrainosus: Secondary | ICD-10-CM | POA: Insufficient documentation

## 2013-06-15 DIAGNOSIS — I10 Essential (primary) hypertension: Secondary | ICD-10-CM | POA: Insufficient documentation

## 2013-06-15 DIAGNOSIS — Z862 Personal history of diseases of the blood and blood-forming organs and certain disorders involving the immune mechanism: Secondary | ICD-10-CM | POA: Insufficient documentation

## 2013-06-15 DIAGNOSIS — F411 Generalized anxiety disorder: Secondary | ICD-10-CM | POA: Insufficient documentation

## 2013-06-15 MED ORDER — PROMETHAZINE HCL 25 MG/ML IJ SOLN
25.0000 mg | Freq: Once | INTRAMUSCULAR | Status: AC
  Administered 2013-06-15: 25 mg via INTRAMUSCULAR
  Filled 2013-06-15: qty 1

## 2013-06-15 MED ORDER — HYDROMORPHONE HCL PF 2 MG/ML IJ SOLN
2.0000 mg | Freq: Once | INTRAMUSCULAR | Status: AC
  Administered 2013-06-15: 2 mg via INTRAMUSCULAR
  Filled 2013-06-15: qty 1

## 2013-06-15 NOTE — ED Provider Notes (Signed)
History  This chart was scribed for Zlata Alcaide B. Bernette Mayers, MD by Bennett Scrape, ED Scribe. This patient was seen in room APA19/APA19 and the patient's care was started at 12:21 PM.  CSN: [id number] Arrival date & time 06/15/13  1209  First MD Initiated Contact with Patient 06/15/13 1221     Chief Complaint  Patient presents with  . Migraine    The history is provided by the patient. No language interpreter was used.    HPI Comments: [name] is a 52 y.o. female with a h/o migraines who presents to the Emergency Department complaining of 2 days of constant, gradually worsening HA described as throbbing with associated nausea. She states that the HA is worse with bright lights. She reports that she has taken Tylenol and Excedrin Migraine medication without relief. She admits that this HA is similar to her prior migraines and states that she is here for antiemetic medication. She denies emesis as an associated symptom. She states that she has tried to follow up for her migraines but can't find a neurologist to take Medicare or one taking new patients.  Past Medical History  Diagnosis Date  . Hypertension   . Bipolar 1 disorder   . Depression   . Hypercholesteremia   . Acid reflux   . Chronic pain   . Stomach ulcer   . Headache(784.0)   . Anxiety   . Crohn's disease   . Seizures   . Migraine    Past Surgical History  Procedure Laterality Date  . Cholecystectomy    . Tonsillectomy    . Stomach surgery    . Tubal ligation     Family History  Problem Relation Age of Onset  . Hypertension Father   . Diabetes type II Brother   . Hypertension Brother   . Diabetes type II Mother    History  Substance Use Topics  . Smoking status: Never Smoker   . Smokeless tobacco: Not on file  . Alcohol Use: No   No OB history provided.  Review of Systems  A complete 10 system review of systems was obtained and all systems are negative except as noted in the HPI and PMH.    Allergies  Compazine; Penicillins; Aspirin; Cephalexin; Ketorolac tromethamine; Metoclopramide hcl; Ondansetron hcl; Sumatriptan; and Tramadol  Home Medications   Current Outpatient Rx  Name  Route  Sig  Dispense  Refill  . acetaminophen (TYLENOL) 500 MG tablet   Oral   Take 500-1,000 mg by mouth daily as needed. pain         . cloNIDine (CATAPRES) 0.1 MG tablet   Oral   Take 0.1 mg by mouth 2 (two) times daily.          . DULoxetine (CYMBALTA) 60 MG capsule   Oral   Take 60 mg by mouth every morning.          . labetalol (NORMODYNE) 200 MG tablet   Oral   Take 200 mg by mouth 2 (two) times daily.         Marland Kitchen levETIRAcetam (KEPPRA) 500 MG tablet   Oral   Take 500-1,000 mg by mouth 2 (two) times daily. **Take one tablet daily in the morning and two tablets at bedtime**         . lubiprostone (AMITIZA) 8 MCG capsule   Oral   Take 8 mcg by mouth 2 (two) times daily with a meal.           Triage  Vitals: BP 138/95  Pulse 88  Temp(Src) 98.1 F (36.7 C) (Oral)  Resp 18  Ht 5\' 4"  (1.626 m)  Wt 200 lb (90.719 kg)  BMI 34.31 kg/m2  SpO2 100%  Physical Exam  Nursing note and vitals reviewed. Constitutional: She is oriented to person, place, and time. She appears well-developed and well-nourished.  HENT:  Head: Normocephalic and atraumatic.  Eyes: EOM are normal. Pupils are equal, round, and reactive to light.  Neck: Normal range of motion. Neck supple.  Cardiovascular: Normal rate, normal heart sounds and intact distal pulses.   Pulmonary/Chest: Effort normal and breath sounds normal.  Abdominal: Bowel sounds are normal. She exhibits no distension. There is no tenderness.  Musculoskeletal: Normal range of motion. She exhibits no edema and no tenderness.  Neurological: She is alert and oriented to person, place, and time. She has normal strength. No cranial nerve deficit or sensory deficit.  Skin: Skin is warm and dry. No rash noted.  Psychiatric: She has a  normal mood and affect.    ED Course  Procedures (including critical care time)  DIAGNOSTIC STUDIES: Oxygen Saturation is 100% on room air, normal by my interpretation.    COORDINATION OF CARE: 12:27 PM-Discussed discharge plan which includes phenergan and referral to [facility] neurology with pt at bedside and pt agreed to plan.   Labs Reviewed - No data to display No results found.  1. Migraine     MDM  Pt with numerous ED visits for various complaints has mild migraine, similar to previous. IM meds prior to discharge. Referred to [facility] as they will accept both Medicaid and Medicare.   I personally performed the services described in this documentation, which was scribed in my presence. The recorded information has been reviewed and is accurate.     Judea Riches B. Bernette Mayers, MD 06/15/13 [phone]

## 2013-06-15 NOTE — ED Notes (Signed)
Pt refused wheelchair. Pt was escorted to short stay to meet her friend who was having preop labs. Informed pt to ask for a wheelchair if she becomes drowsy before leaving premises. Pt sitting beside friend in waiting area of short stay.

## 2013-06-15 NOTE — ED Notes (Signed)
Migraine pain began 2 days ago.  Stayed in bed all day yesterday.  Has taken Tylenol and Excedrin Migraine w/out relief. Nausea, no vomiting.  Cannot find neurologist that will take Medicare.

## 2013-06-16 ENCOUNTER — Emergency Department (HOSPITAL_COMMUNITY)
Admission: EM | Admit: 2013-06-16 | Discharge: 2013-06-16 | Disposition: A | Discharge status: Home / Self Care | Payer: Medicare Other | Attending: Emergency Medicine | Admitting: Emergency Medicine

## 2013-06-16 ENCOUNTER — Encounter (HOSPITAL_COMMUNITY): Payer: Self-pay | Admitting: *Deleted

## 2013-06-16 DIAGNOSIS — I10 Essential (primary) hypertension: Secondary | ICD-10-CM | POA: Insufficient documentation

## 2013-06-16 DIAGNOSIS — R110 Nausea: Secondary | ICD-10-CM | POA: Insufficient documentation

## 2013-06-16 DIAGNOSIS — Z8711 Personal history of peptic ulcer disease: Secondary | ICD-10-CM | POA: Insufficient documentation

## 2013-06-16 DIAGNOSIS — Z8639 Personal history of other endocrine, nutritional and metabolic disease: Secondary | ICD-10-CM | POA: Insufficient documentation

## 2013-06-16 DIAGNOSIS — G40909 Epilepsy, unspecified, not intractable, without status epilepticus: Secondary | ICD-10-CM | POA: Insufficient documentation

## 2013-06-16 DIAGNOSIS — G43909 Migraine, unspecified, not intractable, without status migrainosus: Secondary | ICD-10-CM | POA: Insufficient documentation

## 2013-06-16 DIAGNOSIS — F411 Generalized anxiety disorder: Secondary | ICD-10-CM | POA: Insufficient documentation

## 2013-06-16 DIAGNOSIS — F319 Bipolar disorder, unspecified: Secondary | ICD-10-CM | POA: Insufficient documentation

## 2013-06-16 DIAGNOSIS — G8929 Other chronic pain: Secondary | ICD-10-CM | POA: Insufficient documentation

## 2013-06-16 DIAGNOSIS — Z8719 Personal history of other diseases of the digestive system: Secondary | ICD-10-CM | POA: Insufficient documentation

## 2013-06-16 DIAGNOSIS — Z862 Personal history of diseases of the blood and blood-forming organs and certain disorders involving the immune mechanism: Secondary | ICD-10-CM | POA: Insufficient documentation

## 2013-06-16 DIAGNOSIS — Z880 Allergy status to penicillin: Secondary | ICD-10-CM | POA: Insufficient documentation

## 2013-06-16 MED ORDER — PROMETHAZINE HCL 25 MG/ML IJ SOLN
12.5000 mg | Freq: Once | INTRAMUSCULAR | Status: DC
  Filled 2013-06-16: qty 1

## 2013-06-16 MED ORDER — PROMETHAZINE HCL 25 MG/ML IJ SOLN
12.5000 mg | Freq: Once | INTRAMUSCULAR | Status: AC
  Administered 2013-06-16: 12.5 mg via INTRAMUSCULAR

## 2013-06-16 MED ORDER — HYDROMORPHONE HCL PF 1 MG/ML IJ SOLN
1.0000 mg | Freq: Once | INTRAMUSCULAR | Status: AC
  Administered 2013-06-16: 1 mg via INTRAMUSCULAR

## 2013-06-16 MED ORDER — SODIUM CHLORIDE 0.9 % IV BOLUS (SEPSIS): 1000.0000 mL | Freq: Once | INTRAVENOUS | Status: DC

## 2013-06-16 MED ORDER — HYDROMORPHONE HCL PF 1 MG/ML IJ SOLN
1.0000 mg | Freq: Once | INTRAMUSCULAR | Status: DC
  Filled 2013-06-16: qty 1

## 2013-06-16 NOTE — ED Provider Notes (Signed)
History  This chart was scribed for Danielle Hutching, MD, by Yevette Edwards, ED Scribe. This patient was seen in room APA03/APA03 and the patient's care was started at 1:25 PM.  CSN: [id number] Arrival date & time 06/16/13  1259  First MD Initiated Contact with Patient 06/16/13 1317     Chief Complaint  Patient presents with  . Migraine    HPI HPI Comments: ADALENE GULOTTA is a 52 y.o. female, with a h/o of migraines and HTN, who presents to the Emergency Department complaining of a headache. She states that this headache is similar to prior episodes of migraines. She has experienced nausea and sensitivity to light as associated symptoms. She denies experiencing any emesis. The pt had been treated in the ED yesterday for the same issue, but she states that the headache returned last night. She then treated the headache with tylenol, but without resolution. The pt denies smoking and she denies alcohol use.   The pt's PCP is in [place].  She reports attempting to find a neurologist who accepts Medicare.   Past Medical History  Diagnosis Date  . Hypertension   . Bipolar 1 disorder   . Depression   . Hypercholesteremia   . Acid reflux   . Chronic pain   . Stomach ulcer   . Headache(784.0)   . Anxiety   . Crohn's disease   . Seizures   . Migraine    Past Surgical History  Procedure Laterality Date  . Cholecystectomy    . Tonsillectomy    . Stomach surgery    . Tubal ligation     Family History  Problem Relation Age of Onset  . Hypertension Father   . Diabetes type II Brother   . Hypertension Brother   . Diabetes type II Mother    History  Substance Use Topics  . Smoking status: Never Smoker   . Smokeless tobacco: Not on file  . Alcohol Use: No   No OB history provided.  Review of Systems  Allergies  Compazine; Penicillins; Aspirin; Cephalexin; Ketorolac tromethamine; Metoclopramide hcl; Ondansetron hcl; Sumatriptan; and Tramadol  Home Medications   Current  Outpatient Rx  Name  Route  Sig  Dispense  Refill  . acetaminophen (TYLENOL) 500 MG tablet   Oral   Take 500-1,000 mg by mouth daily as needed. pain         . cloNIDine (CATAPRES) 0.1 MG tablet   Oral   Take 0.1 mg by mouth 2 (two) times daily.          . DULoxetine (CYMBALTA) 60 MG capsule   Oral   Take 60 mg by mouth every morning.          . labetalol (NORMODYNE) 200 MG tablet   Oral   Take 200 mg by mouth 2 (two) times daily.         Marland Kitchen levETIRAcetam (KEPPRA) 500 MG tablet   Oral   Take 500-1,000 mg by mouth 2 (two) times daily. **Take one tablet daily in the morning and two tablets at bedtime**         . lubiprostone (AMITIZA) 8 MCG capsule   Oral   Take 8 mcg by mouth 2 (two) times daily with a meal.           Triage Vitals: BP 137/86  Pulse 92  Temp(Src) 98 F (36.7 C) (Oral)  Resp 20  Ht 5\' 4"  (1.626 m)  Wt 207 lb 4 oz (94.008 kg)  BMI  35.56 kg/m2  SpO2 100%  Physical Exam  Nursing note and vitals reviewed. Constitutional: She is oriented to person, place, and time. She appears well-developed and well-nourished.  HENT:  Head: Normocephalic and atraumatic.  Eyes: Conjunctivae and EOM are normal. Pupils are equal, round, and reactive to light.  Photophobia.  Neck: Normal range of motion. Neck supple.  Cardiovascular: Normal rate, regular rhythm and normal heart sounds.   Pulmonary/Chest: Effort normal and breath sounds normal.  Abdominal: Soft. Bowel sounds are normal.  Musculoskeletal: Normal range of motion.  Neurological: She is alert and oriented to person, place, and time.  Skin: Skin is warm and dry.  Psychiatric: She has a normal mood and affect.    ED Course  Procedures (including critical care time)  COORDINATION OF CARE:  1:29 PM-Discussed treatment plan with patient which includes pain medication, and the patient agreed to the plan.   DIAGNOSTIC STUDIES:  Oxygen Saturation is 100% on room air, normal by my interpretation.     Labs Reviewed - No data to display No results found. No diagnosis found.  MDM  She presents with typical migraine symptoms. No stiff neck or neuro deficits. Rx pain and antinausea medication    I personally performed the services described in this documentation, which was scribed in my presence. The recorded information has been reviewed and is accurate.     Danielle Hutching, MD 06/20/13 [phone]

## 2013-06-16 NOTE — ED Notes (Signed)
Unable to obtain IV access. MD aware. Orders changed to IM.

## 2013-06-16 NOTE — ED Notes (Signed)
Patient would like something for pain. RN made aware. Patient given ice chips per RN approval.

## 2013-06-16 NOTE — ED Notes (Signed)
Presenting w/migraine today after being seen for same yesterday and treated w/IM medications for nausea and pain.  This helped enough that she could sleep, but headache came back overnight. Some nausea, no vomiting.

## 2013-06-16 NOTE — ED Notes (Signed)
Patient requesting pain medication... MD aware

## 2013-06-29 ENCOUNTER — Encounter (HOSPITAL_COMMUNITY): Payer: Self-pay

## 2013-06-29 ENCOUNTER — Emergency Department (HOSPITAL_COMMUNITY)
Admission: EM | Admit: 2013-06-29 | Discharge: 2013-06-29 | Disposition: A | Discharge status: Home / Self Care | Payer: Medicare Other | Attending: Emergency Medicine | Admitting: Emergency Medicine

## 2013-06-29 DIAGNOSIS — Z79899 Other long term (current) drug therapy: Secondary | ICD-10-CM | POA: Insufficient documentation

## 2013-06-29 DIAGNOSIS — Z880 Allergy status to penicillin: Secondary | ICD-10-CM | POA: Insufficient documentation

## 2013-06-29 DIAGNOSIS — I10 Essential (primary) hypertension: Secondary | ICD-10-CM

## 2013-06-29 DIAGNOSIS — F319 Bipolar disorder, unspecified: Secondary | ICD-10-CM | POA: Insufficient documentation

## 2013-06-29 DIAGNOSIS — Z8639 Personal history of other endocrine, nutritional and metabolic disease: Secondary | ICD-10-CM | POA: Insufficient documentation

## 2013-06-29 DIAGNOSIS — R110 Nausea: Secondary | ICD-10-CM | POA: Insufficient documentation

## 2013-06-29 DIAGNOSIS — Z8719 Personal history of other diseases of the digestive system: Secondary | ICD-10-CM | POA: Insufficient documentation

## 2013-06-29 DIAGNOSIS — F411 Generalized anxiety disorder: Secondary | ICD-10-CM | POA: Insufficient documentation

## 2013-06-29 DIAGNOSIS — Z8669 Personal history of other diseases of the nervous system and sense organs: Secondary | ICD-10-CM | POA: Insufficient documentation

## 2013-06-29 DIAGNOSIS — G43909 Migraine, unspecified, not intractable, without status migrainosus: Secondary | ICD-10-CM | POA: Insufficient documentation

## 2013-06-29 DIAGNOSIS — G8929 Other chronic pain: Secondary | ICD-10-CM | POA: Insufficient documentation

## 2013-06-29 DIAGNOSIS — Z9889 Other specified postprocedural states: Secondary | ICD-10-CM | POA: Insufficient documentation

## 2013-06-29 DIAGNOSIS — Z862 Personal history of diseases of the blood and blood-forming organs and certain disorders involving the immune mechanism: Secondary | ICD-10-CM | POA: Insufficient documentation

## 2013-06-29 DIAGNOSIS — R0789 Other chest pain: Secondary | ICD-10-CM | POA: Insufficient documentation

## 2013-06-29 LAB — CBC WITH DIFFERENTIAL/PLATELET
Basophils Absolute: 0 10*3/uL (ref 0.0–0.1)
Basophils Relative: 0 % (ref 0–1)
Eosinophils Absolute: 0.1 10*3/uL (ref 0.0–0.7)
Eosinophils Relative: 1 % (ref 0–5)
HCT: 38.4 % (ref 36.0–46.0)
Hemoglobin: 12.8 g/dL (ref 12.0–15.0)
MCH: 27.2 pg (ref 26.0–34.0)
MCHC: 33.3 g/dL (ref 30.0–36.0)
MCV: 81.5 fL (ref 78.0–100.0)
Monocytes Absolute: 0.6 10*3/uL (ref 0.1–1.0)
Monocytes Relative: 6 % (ref 3–12)
Neutro Abs: 5.9 10*3/uL (ref 1.7–7.7)
RDW: 14.7 % (ref 11.5–15.5)

## 2013-06-29 LAB — TROPONIN I: Troponin I: <0.3 ng/mL (ref <0.30)

## 2013-06-29 LAB — BASIC METABOLIC PANEL
BUN: 9 mg/dL (ref 6–23)
Calcium: 10.3 mg/dL (ref 8.4–10.5)
Chloride: 103 mEq/L (ref 96–112)
Creatinine, Ser: 0.72 mg/dL (ref 0.50–1.10)
GFR calc Af Amer: >90 mL/min (ref >90)

## 2013-06-29 MED ORDER — SODIUM CHLORIDE 0.9 % IV BOLUS (SEPSIS)
1000.0000 mL | Freq: Once | INTRAVENOUS | Status: AC
  Administered 2013-06-29: 1000 mL via INTRAVENOUS

## 2013-06-29 MED ORDER — PROMETHAZINE HCL 12.5 MG PO TABS
25.0000 mg | ORAL_TABLET | Freq: Once | ORAL | Status: AC
  Administered 2013-06-29: 25 mg via ORAL
  Filled 2013-06-29: qty 2

## 2013-06-29 MED ORDER — LABETALOL HCL 5 MG/ML IV SOLN
20.0000 mg | Freq: Once | INTRAVENOUS | Status: AC
  Administered 2013-06-29: 20 mg via INTRAVENOUS
  Filled 2013-06-29: qty 4

## 2013-06-29 MED ORDER — ACETAMINOPHEN 500 MG PO TABS
1000.0000 mg | ORAL_TABLET | Freq: Once | ORAL | Status: AC
Start: 2013-06-29 — End: 2013-06-29
  Administered 2013-06-29: 1000 mg via ORAL
  Filled 2013-06-29: qty 2

## 2013-06-29 NOTE — ED Notes (Signed)
Discharge instructions given and reviewed with patient.  Patient verbalized understanding to follow up with PMD as needed.  Patient ambulatory; discharged home in good condition. 

## 2013-06-29 NOTE — ED Provider Notes (Signed)
CSN: [id number]     Arrival date & time 06/29/13  1521 History     First MD Initiated Contact with Patient 06/29/13 1547     Chief Complaint  Patient presents with  . Hypertension   (Consider location/radiation/quality/duration/timing/severity/associated sxs/prior Treatment) HPI Comments: 52 year old female with a history of hypertension who presents after finding her light pressure elevated while at her boyfriend's doctor's appointment. She complains that she has mild chest tightness without shortness of breath as well as a mild headache. She states that she has taken her antihypertensives as directed. She denies missing any doses.  Patient is a 52 y.o. female presenting with hypertension.  Hypertension This is a chronic problem. The problem occurs constantly. Progression since onset: Worse. Associated symptoms include chest pain (tightness) and headaches. Pertinent negatives include no abdominal pain and no shortness of breath. Nothing aggravates the symptoms. Nothing relieves the symptoms. She has tried nothing for the symptoms.    Past Medical History  Diagnosis Date  . Hypertension   . Bipolar 1 disorder   . Depression   . Hypercholesteremia   . Acid reflux   . Chronic pain   . Stomach ulcer   . Headache(784.0)   . Anxiety   . Crohn's disease   . Seizures   . Migraine    Past Surgical History  Procedure Laterality Date  . Cholecystectomy    . Tonsillectomy    . Stomach surgery    . Tubal ligation     Family History  Problem Relation Age of Onset  . Hypertension Father   . Diabetes type II Brother   . Hypertension Brother   . Diabetes type II Mother    History  Substance Use Topics  . Smoking status: Never Smoker   . Smokeless tobacco: Not on file  . Alcohol Use: No   OB History   Grav Para Term Preterm Abortions TAB SAB Ect Mult Living                 Review of Systems  Constitutional: Negative for fever.  HENT: Negative for congestion.   Respiratory:  Negative for cough and shortness of breath.   Cardiovascular: Positive for chest pain (tightness).  Gastrointestinal: Positive for nausea. Negative for vomiting, abdominal pain and diarrhea.  Neurological: Positive for headaches.  All other systems reviewed and are negative.    Allergies  Compazine; Penicillins; Aspirin; Cephalexin; Ketorolac tromethamine; Metoclopramide hcl; Ondansetron hcl; Sumatriptan; and Tramadol  Home Medications   Current Outpatient Rx  Name  Route  Sig  Dispense  Refill  . acetaminophen (TYLENOL) 500 MG tablet   Oral   Take 500-1,000 mg by mouth daily as needed. pain         . cloNIDine (CATAPRES) 0.1 MG tablet   Oral   Take 0.1 mg by mouth 2 (two) times daily.          . DULoxetine (CYMBALTA) 60 MG capsule   Oral   Take 60 mg by mouth every morning.          . labetalol (NORMODYNE) 200 MG tablet   Oral   Take 200 mg by mouth 2 (two) times daily.         Marland Kitchen levETIRAcetam (KEPPRA) 500 MG tablet   Oral   Take 500-1,000 mg by mouth 2 (two) times daily. **Take one tablet daily in the morning and two tablets at bedtime**         . lubiprostone (AMITIZA) 8 MCG capsule  Oral   Take 8 mcg by mouth 2 (two) times daily with a meal.           BP 193/116  Pulse 92  Temp(Src) 98.5 F (36.9 C) (Oral)  Resp 20  Ht 5\' 4"  (1.626 m)  Wt 195 lb (88.451 kg)  BMI 33.46 kg/m2  SpO2 99% Physical Exam  Nursing note and vitals reviewed. Constitutional: She is oriented to person, place, and time. She appears well-developed and well-nourished. No distress.  HENT:  Head: Normocephalic and atraumatic.  Mouth/Throat: Oropharynx is clear and moist.  Eyes: Conjunctivae are normal. Pupils are equal, round, and reactive to light. No scleral icterus.  Fundoscopic exam:      The right eye shows no papilledema.       The left eye shows no papilledema.  Neck: Neck supple.  Cardiovascular: Normal rate, regular rhythm, normal heart sounds and intact distal  pulses.   No murmur heard. Pulmonary/Chest: Effort normal and breath sounds normal. No stridor. No respiratory distress. She has no rales.  Abdominal: Soft. Bowel sounds are normal. She exhibits no distension. There is no tenderness.  Musculoskeletal: Normal range of motion.  Neurological: She is alert and oriented to person, place, and time.  Skin: Skin is warm and dry. No rash noted.  Psychiatric: Her behavior is normal. Her mood appears anxious.    ED Course   Angiocath insertion Date/Time: 06/29/2013 5:10 PM Performed by: Blake Divine DAVID Authorized by: Blake Divine DAVID Comments: 18g angiocath inserted in left brachial vein under ULTRASOUND GUIDANCE.  Good return of dark non pulsatile blood.  Flushed easily.   (including critical care time)  Labs Reviewed  CBC WITH DIFFERENTIAL  BASIC METABOLIC PANEL  TROPONIN I   No results found.  EKG - NSR, rate 82, normal axis, normal intervals, no ST/T changes, similar to prior.   1. Hypertension     MDM  52 yo female with HTN, now complaining of high BPs, chest tightness, and headache.  Well appearing, VSS.  She does appear anxious, and I suspect that her symptoms relate mostly to anxiety.  Her EKG and exam show no signs of end organ damage, but will check blood work due to her symptoms.  Of note, she has over 15 ED visits so far this year.    labwork unremarkable.  BP responded to labetalol.  On persistent questioning, she endorses missing doses of her antihypertensives, but states she has them at home to take.  Her headache improved.  Her chest pain resolved.  Patient did not want to stay for delta troponin, although I have a very low suspicion for ACS as the cause of her mild chest tightness.  She understood the risks of not obtaining this lab test.  Stated that she just wanted to go home.  Provided resources so she could establish care with a PCP.    Candyce Churn, MD 06/29/13 [phone]

## 2013-06-29 NOTE — ED Notes (Signed)
Pt reports woke up "not feeling well."  Reports was at Dr. Rudean Haskell office this morning with her boyfried and she got them to check her bp.  Reports bp was 173/113.  Also reports having headache and some tightness in chest.

## 2013-06-29 NOTE — ED Notes (Signed)
Informed by NT that Patient lost IV access. NT states IV fluids were clamped and IV was laying beside patient prior to him entering room. Patient states she went to reach for her drink and it fell out". Bleeding noted to be minimal on tegaderm dressing and not noted on arms, gown, or bed sheets.  Patient received about 400 CCs NS prior to loss of IV access. Repeatedly asking for more "medicine" for her headache. MD aware.

## 2014-02-11 ENCOUNTER — Emergency Department (HOSPITAL_COMMUNITY)
Admission: EM | Admit: 2014-02-11 | Discharge: 2014-02-11 | Disposition: A | Discharge status: Home / Self Care | Payer: Medicare Other | Attending: Emergency Medicine | Admitting: Emergency Medicine

## 2014-02-11 ENCOUNTER — Encounter (HOSPITAL_COMMUNITY): Payer: Self-pay | Admitting: Emergency Medicine

## 2014-02-11 DIAGNOSIS — Z880 Allergy status to penicillin: Secondary | ICD-10-CM | POA: Insufficient documentation

## 2014-02-11 DIAGNOSIS — R51 Headache: Secondary | ICD-10-CM

## 2014-02-11 DIAGNOSIS — Z8639 Personal history of other endocrine, nutritional and metabolic disease: Secondary | ICD-10-CM | POA: Insufficient documentation

## 2014-02-11 DIAGNOSIS — G43909 Migraine, unspecified, not intractable, without status migrainosus: Secondary | ICD-10-CM | POA: Insufficient documentation

## 2014-02-11 DIAGNOSIS — R519 Headache, unspecified: Secondary | ICD-10-CM

## 2014-02-11 DIAGNOSIS — F411 Generalized anxiety disorder: Secondary | ICD-10-CM | POA: Insufficient documentation

## 2014-02-11 DIAGNOSIS — I10 Essential (primary) hypertension: Secondary | ICD-10-CM | POA: Insufficient documentation

## 2014-02-11 DIAGNOSIS — G8929 Other chronic pain: Secondary | ICD-10-CM | POA: Insufficient documentation

## 2014-02-11 DIAGNOSIS — F319 Bipolar disorder, unspecified: Secondary | ICD-10-CM | POA: Insufficient documentation

## 2014-02-11 DIAGNOSIS — Z862 Personal history of diseases of the blood and blood-forming organs and certain disorders involving the immune mechanism: Secondary | ICD-10-CM | POA: Insufficient documentation

## 2014-02-11 DIAGNOSIS — Z8719 Personal history of other diseases of the digestive system: Secondary | ICD-10-CM | POA: Insufficient documentation

## 2014-02-11 DIAGNOSIS — Z79899 Other long term (current) drug therapy: Secondary | ICD-10-CM | POA: Insufficient documentation

## 2014-02-11 MED ORDER — HYDROMORPHONE HCL PF 1 MG/ML IJ SOLN
1.0000 mg | Freq: Once | INTRAMUSCULAR | Status: AC
  Administered 2014-02-11: 1 mg via INTRAVENOUS
  Filled 2014-02-11: qty 1

## 2014-02-11 MED ORDER — SODIUM CHLORIDE 0.9 % IV BOLUS (SEPSIS)
1000.0000 mL | Freq: Once | INTRAVENOUS | Status: AC
  Administered 2014-02-11: 1000 mL via INTRAVENOUS

## 2014-02-11 MED ORDER — PROMETHAZINE HCL 25 MG/ML IJ SOLN
25.0000 mg | Freq: Once | INTRAMUSCULAR | Status: AC
  Administered 2014-02-11: 25 mg via INTRAVENOUS
  Filled 2014-02-11: qty 1

## 2014-02-11 MED ORDER — DIPHENHYDRAMINE HCL 50 MG/ML IJ SOLN
25.0000 mg | Freq: Once | INTRAMUSCULAR | Status: AC
  Administered 2014-02-11: 25 mg via INTRAVENOUS
  Filled 2014-02-11: qty 1

## 2014-02-11 MED ORDER — DEXAMETHASONE SODIUM PHOSPHATE 4 MG/ML IJ SOLN
10.0000 mg | Freq: Once | INTRAMUSCULAR | Status: AC
  Administered 2014-02-11: 10 mg via INTRAVENOUS
  Filled 2014-02-11: qty 3

## 2014-02-11 NOTE — Discharge Instructions (Signed)
Migraine Headache A migraine headache is an intense, throbbing pain on one or both sides of your head. A migraine can last for 30 minutes to several hours. CAUSES  The exact cause of a migraine headache is not always known. However, a migraine may be caused when nerves in the brain become irritated and release chemicals that cause inflammation. This causes pain. Certain things may also trigger migraines, such as:  Alcohol.  Smoking.  Stress.  Menstruation.  Aged cheeses.  Foods or drinks that contain nitrates, glutamate, aspartame, or tyramine.  Lack of sleep.  Chocolate.  Caffeine.  Hunger.  Physical exertion.  Fatigue.  Medicines used to treat chest pain (nitroglycerine), birth control pills, estrogen, and some blood pressure medicines. SIGNS AND SYMPTOMS  Pain on one or both sides of your head.  Pulsating or throbbing pain.  Severe pain that prevents daily activities.  Pain that is aggravated by any physical activity.  Nausea, vomiting, or both.  Dizziness.  Pain with exposure to bright lights, loud noises, or activity.  General sensitivity to bright lights, loud noises, or smells. Before you get a migraine, you may get warning signs that a migraine is coming (aura). An aura may include:  Seeing flashing lights.  Seeing bright spots, halos, or zig-zag lines.  Having tunnel vision or blurred vision.  Having feelings of numbness or tingling.  Having trouble talking.  Having muscle weakness. DIAGNOSIS  A migraine headache is often diagnosed based on:  Symptoms.  Physical exam.  A CT scan or MRI of your head. These imaging tests cannot diagnose migraines, but they can help rule out other causes of headaches. TREATMENT Medicines may be given for pain and nausea. Medicines can also be given to help prevent recurrent migraines.  HOME CARE INSTRUCTIONS  Only take over-the-counter or prescription medicines for pain or discomfort as directed by your  health care provider. The use of long-term narcotics is not recommended.  Lie down in a dark, quiet room when you have a migraine.  Keep a journal to find out what may trigger your migraine headaches. For example, write down:  What you eat and drink.  How much sleep you get.  Any change to your diet or medicines.  Limit alcohol consumption.  Quit smoking if you smoke.  Get 7 9 hours of sleep, or as recommended by your health care provider.  Limit stress.  Keep lights dim if bright lights bother you and make your migraines worse. SEEK IMMEDIATE MEDICAL CARE IF:   Your migraine becomes severe.  You have a fever.  You have a stiff neck.  You have vision loss.  You have muscular weakness or loss of muscle control.  You start losing your balance or have trouble walking.  You feel faint or pass out.  You have severe symptoms that are different from your first symptoms. MAKE SURE YOU:   Understand these instructions.  Will watch your condition.  Will get help right away if you are not doing well or get worse. Document Released: 11/16/2005 Document Revised: 09/06/2013 Document Reviewed: 07/24/2013 [employer] Patient Information 2014 [employer].  

## 2014-02-11 NOTE — ED Notes (Signed)
Pt c/o migraine ha with nausea x 2 hours.

## 2014-02-11 NOTE — ED Provider Notes (Signed)
CSN: [id number]     Arrival date & time 02/11/14  1430 History   This chart was scribed for Glynn OctaveStephen Milda Lindvall, MD by Ladona Ridgelaylor Day, ED scribe. This patient was seen in room [facility]12/[facility]12 and the patient's care was started at 1430.  Chief Complaint  Patient presents with  . Migraine   The history is provided by the patient. No language interpreter was used.   HPI Comments: [name] is a 53 y.o. female who presents to the Emergency Department having a throbbing frontal and bilateral parietal migraine, constant, gradually/progressivley worsened since this AM. Denies thunderclap onset. She reports associated nausea, no emesis. She states last similar migraine a few months ago and head CT about same time. She usually takes excedrin but doesn't b/c of HTN. Reports associated photophobia. Has been hospitalized previously for similar migraines. She denies emesis, fever, numbness or tingling. Denies abdominal or CP.  She has no neurologist.  Past Medical History  Diagnosis Date  . Hypertension   . Bipolar 1 disorder   . Depression   . Hypercholesteremia   . Acid reflux   . Chronic pain   . Stomach ulcer   . Headache(784.0)   . Anxiety   . Crohn's disease   . Seizures   . Migraine    Past Surgical History  Procedure Laterality Date  . Cholecystectomy    . Tonsillectomy    . Stomach surgery    . Tubal ligation     Family History  Problem Relation Age of Onset  . Hypertension Father   . Diabetes type II Brother   . Hypertension Brother   . Diabetes type II Mother    History  Substance Use Topics  . Smoking status: Never Smoker   . Smokeless tobacco: Not on file  . Alcohol Use: No   OB History   Grav Para Term Preterm Abortions TAB SAB Ect Mult Living                 Review of Systems  Constitutional: Negative for fever and chills.  HENT: Negative for congestion and rhinorrhea.   Eyes: Positive for photophobia.  Respiratory: Negative for cough and shortness of breath.    Cardiovascular: Negative for chest pain.  Gastrointestinal: Negative for nausea, vomiting, abdominal pain and diarrhea.  Musculoskeletal: Negative for back pain.  Skin: Negative for color change and rash.  Neurological: Positive for headaches. Negative for syncope, weakness and numbness.  All other systems reviewed and are negative.    Allergies  Compazine; Penicillins; Aspirin; Cephalexin; Ketorolac tromethamine; Metoclopramide hcl; Ondansetron hcl; Sumatriptan; and Tramadol  Home Medications   Current Outpatient Rx  Name  Route  Sig  Dispense  Refill  . acetaminophen (TYLENOL) 500 MG tablet   Oral   Take 500-1,000 mg by mouth daily as needed. pain         . cloNIDine (CATAPRES) 0.1 MG tablet   Oral   Take 0.1 mg by mouth 2 (two) times daily.          Marland Kitchen. labetalol (NORMODYNE) 200 MG tablet   Oral   Take 200 mg by mouth 2 (two) times daily.          Triage Vitals: BP 170/101  Pulse 84  Temp(Src) 97.4 F (36.3 C) (Oral)  Resp 18  Ht 5\' 4"  (1.626 m)  Wt 220 lb (99.791 kg)  BMI 37.74 kg/m2  SpO2 98%  Physical Exam  Nursing note and vitals reviewed. Constitutional: She is oriented to person,  place, and time. She appears well-developed and well-nourished. No distress.  HENT:  Head: Normocephalic and atraumatic.  Tenderness to palpation bilateral parietal scalp No temporal artery tenderness  Eyes: Conjunctivae are normal. Right eye exhibits no discharge. Left eye exhibits no discharge.  Neck: Normal range of motion.  Non meningismus neck  Cardiovascular: Normal rate.   Pulmonary/Chest: Effort normal. No respiratory distress.  Musculoskeletal: Normal range of motion. She exhibits no edema.  Neurological: She is alert and oriented to person, place, and time. No cranial nerve deficit.   5/5 strength throughout, no ataxia finger to nose, no pronator drift.  CN 2-12 intact, no ataxia on finger to nose, no nystagmus, 5/5 strength throughout, no pronator drift,  Romberg negative, normal gait.   Skin: Skin is warm and dry.  Psychiatric: She has a normal mood and affect. Thought content normal.    ED Course  Procedures (including critical care time) DIAGNOSTIC STUDIES: Oxygen Saturation is 98% on room air, normal by my interpretation.    COORDINATION OF CARE: At 315 PM Discussed treatment plan with patient which includes IV fluids, benadryl, decadron, phenergan. Patient agrees.   Labs Review Labs Reviewed - No data to display Imaging Review No results found.   EKG Interpretation None      MDM   Final diagnoses:  Headache   Gradual onset headache since this morning typical of previous migraines. No fever. Associated with photophobia and nausea. No thunderclap onset.  No focal weakness, numbness or tingling. No meningismus. Nonfocal neuro exam. Do not suspect SAH or meningitis.   Patient states "they usually have to give me Dilaudid and Phenergan."  Headache improved with treatment in the ED. Blood pressure improved to 153/82. She's tolerating by mouth. She is given referral neurology.  BP 153/82  Pulse 72  Temp(Src) 97.4 F (36.3 C) (Oral)  Resp 18  Ht 5\' 4"  (1.626 m)  Wt 220 lb (99.791 kg)  BMI 37.74 kg/m2  SpO2 98%  I personally performed the services described in this documentation, which was scribed in my presence. The recorded information has been reviewed and is accurate.      Glynn Octave, MD 02/11/14 [phone]

## 2014-03-30 ENCOUNTER — Encounter (HOSPITAL_COMMUNITY): Payer: Self-pay | Admitting: Emergency Medicine

## 2014-03-30 ENCOUNTER — Emergency Department (HOSPITAL_COMMUNITY)
Admission: EM | Admit: 2014-03-30 | Discharge: 2014-03-30 | Disposition: A | Discharge status: Home / Self Care | Payer: Medicare Other | Attending: Emergency Medicine | Admitting: Emergency Medicine

## 2014-03-30 DIAGNOSIS — B019 Varicella without complication: Secondary | ICD-10-CM | POA: Insufficient documentation

## 2014-03-30 DIAGNOSIS — I10 Essential (primary) hypertension: Secondary | ICD-10-CM | POA: Insufficient documentation

## 2014-03-30 DIAGNOSIS — Z8669 Personal history of other diseases of the nervous system and sense organs: Secondary | ICD-10-CM | POA: Insufficient documentation

## 2014-03-30 DIAGNOSIS — Z880 Allergy status to penicillin: Secondary | ICD-10-CM | POA: Insufficient documentation

## 2014-03-30 DIAGNOSIS — F411 Generalized anxiety disorder: Secondary | ICD-10-CM | POA: Insufficient documentation

## 2014-03-30 DIAGNOSIS — G8929 Other chronic pain: Secondary | ICD-10-CM | POA: Insufficient documentation

## 2014-03-30 DIAGNOSIS — B029 Zoster without complications: Secondary | ICD-10-CM

## 2014-03-30 DIAGNOSIS — Z79899 Other long term (current) drug therapy: Secondary | ICD-10-CM | POA: Insufficient documentation

## 2014-03-30 DIAGNOSIS — Z8719 Personal history of other diseases of the digestive system: Secondary | ICD-10-CM | POA: Insufficient documentation

## 2014-03-30 DIAGNOSIS — Z862 Personal history of diseases of the blood and blood-forming organs and certain disorders involving the immune mechanism: Secondary | ICD-10-CM | POA: Insufficient documentation

## 2014-03-30 DIAGNOSIS — G43909 Migraine, unspecified, not intractable, without status migrainosus: Secondary | ICD-10-CM | POA: Insufficient documentation

## 2014-03-30 DIAGNOSIS — F319 Bipolar disorder, unspecified: Secondary | ICD-10-CM | POA: Insufficient documentation

## 2014-03-30 DIAGNOSIS — Z8639 Personal history of other endocrine, nutritional and metabolic disease: Secondary | ICD-10-CM | POA: Insufficient documentation

## 2014-03-30 MED ORDER — VALACYCLOVIR HCL 1 G PO TABS: 1000.0000 mg | ORAL_TABLET | Freq: Three times a day (TID) | ORAL | Status: AC

## 2014-03-30 MED ORDER — OXYCODONE-ACETAMINOPHEN 5-325 MG PO TABS
1.0000 | ORAL_TABLET | Freq: Once | ORAL | Status: AC
  Administered 2014-03-30: 1 via ORAL
  Filled 2014-03-30: qty 1

## 2014-03-30 MED ORDER — OXYCODONE-ACETAMINOPHEN 7.5-325 MG PO TABS: 1.0000 | ORAL_TABLET | Freq: Four times a day (QID) | ORAL | Status: AC | PRN

## 2014-03-30 NOTE — ED Notes (Signed)
Pt c/o rash under breast x 3 days. And c/o pain. Areas are scabbed over but not draining. nad

## 2014-03-30 NOTE — ED Provider Notes (Signed)
Medical screening examination/treatment/procedure(s) were conducted as a shared visit with non-physician practitioner(s) and myself.  I personally evaluated the patient during the encounter.  Left chest wall dermatomal zoster-like rash.   Danielle HornJohn M Rain Wilhide, MD 03/30/14 [phone]

## 2014-03-30 NOTE — ED Notes (Signed)
Blisters, pain to lt chest, for 2 days, appear to be shingles.

## 2014-03-30 NOTE — Discharge Instructions (Signed)
Shingles Shingles (herpes zoster) is an infection that is caused by the same virus that causes chickenpox (varicella). The infection causes a painful skin rash and fluid-filled blisters, which eventually break open, crust over, and heal. It may occur in any area of the body, but it usually affects only one side of the body or face. The pain of shingles usually lasts about 1 month. However, some people with shingles may develop long-term (chronic) pain in the affected area of the body. Shingles often occurs many years after the person had chickenpox. It is more common:  In people older than 50 years.  In people with weakened immune systems, such as those with HIV, AIDS, or cancer.  In people taking medicines that weaken the immune system, such as transplant medicines.  In people under great stress. CAUSES  Shingles is caused by the varicella zoster virus (VZV), which also causes chickenpox. After a person is infected with the virus, it can remain in the person's body for years in an inactive state (dormant). To cause shingles, the virus reactivates and breaks out as an infection in a nerve root. The virus can be spread from person to person (contagious) through contact with open blisters of the shingles rash. It will only spread to people who have not had chickenpox. When these people are exposed to the virus, they may develop chickenpox. They will not develop shingles. Once the blisters scab over, the person is no longer contagious and cannot spread the virus to others. SYMPTOMS  Shingles shows up in stages. The initial symptoms may be pain, itching, and tingling in an area of the skin. This pain is usually described as burning, stabbing, or throbbing.In a few days or weeks, a painful red rash will appear in the area where the pain, itching, and tingling were felt. The rash is usually on one side of the body in a band or belt-like pattern. Then, the rash usually turns into fluid-filled blisters. They  will scab over and dry up in approximately 2 3 weeks. Flu-like symptoms may also occur with the initial symptoms, the rash, or the blisters. These may include:  Fever.  Chills.  Headache.  Upset stomach. DIAGNOSIS  Your caregiver will perform a skin exam to diagnose shingles. Skin scrapings or fluid samples may also be taken from the blisters. This sample will be examined under a microscope or sent to a lab for further testing. TREATMENT  There is no specific cure for shingles. Your caregiver will likely prescribe medicines to help you manage the pain, recover faster, and avoid long-term problems. This may include antiviral drugs, anti-inflammatory drugs, and pain medicines. HOME CARE INSTRUCTIONS   Take a cool bath or apply cool compresses to the area of the rash or blisters as directed. This may help with the pain and itching.   Only take over-the-counter or prescription medicines as directed by your caregiver.   Rest as directed by your caregiver.  Keep your rash and blisters clean with mild soap and cool water or as directed by your caregiver.  Do not pick your blisters or scratch your rash. Apply an anti-itch cream or numbing creams to the affected area as directed by your caregiver.  Keep your shingles rash covered with a loose bandage (dressing).  Avoid skin contact with:  Babies.   Pregnant women.   Children with eczema.   Elderly people with transplants.   People with chronic illnesses, such as leukemia or AIDS.   Wear loose-fitting clothing to help ease   the pain of material rubbing against the rash.  Keep all follow-up appointments with your caregiver.If the area involved is on your face, you may receive a referral for follow-up to a specialist, such as an eye doctor (ophthalmologist) or an ear, nose, and throat (ENT) doctor. Keeping all follow-up appointments will help you avoid eye complications, chronic pain, or disability.  SEEK IMMEDIATE MEDICAL  CARE IF:   You have facial pain, pain around the eye area, or loss of feeling on one side of your face.  You have ear pain or ringing in your ear.  You have loss of taste.  Your pain is not relieved with prescribed medicines.   Your redness or swelling spreads.   You have more pain and swelling.  Your condition is worsening or has changed.   You have a feveror persistent symptoms for more than 2 3 days.  You have a fever and your symptoms suddenly get worse. MAKE SURE YOU:  Understand these instructions.  Will watch your condition.  Will get help right away if you are not doing well or get worse. Document Released: 11/16/2005 Document Revised: 08/10/2012 Document Reviewed: 06/30/2012 [employer] Patient Information 2014 [employer].  

## 2014-03-30 NOTE — ED Provider Notes (Signed)
CSN: [id number]     Arrival date & time 03/30/14  1730 History   First MD Initiated Contact with Patient 03/30/14 1732     Chief Complaint  Patient presents with  . Rash     (Consider location/radiation/quality/duration/timing/severity/associated sxs/prior Treatment) Patient is a 53 y.o. female presenting with rash. The history is provided by the patient.  Rash Location:  Torso Torso rash location:  L chest Quality: blistering, burning, draining and painful   Pain details:    Quality:  Shooting, burning and stinging   Severity:  Severe   Onset quality:  Gradual   Duration:  3 days   Timing:  Constant   Progression:  Worsening Severity:  Severe Timing:  Constant Progression:  Worsening Chronicity:  New Relieved by:  Nothing  Deanette L Mijangos is a 53 y.o. female who presents to the ED with a rash that started under her left breast 3 days ago. The area is very painful. The rash appears as blisters. She denies fever, chills, nausea, vomiting or other problems today.   Past Medical History  Diagnosis Date  . Hypertension   . Bipolar 1 disorder   . Depression   . Hypercholesteremia   . Acid reflux   . Chronic pain   . Stomach ulcer   . Headache(784.0)   . Anxiety   . Crohn's disease   . Seizures   . Migraine    Past Surgical History  Procedure Laterality Date  . Cholecystectomy    . Tonsillectomy    . Stomach surgery    . Tubal ligation     Family History  Problem Relation Age of Onset  . Hypertension Father   . Diabetes type II Brother   . Hypertension Brother   . Diabetes type II Mother    History  Substance Use Topics  . Smoking status: Never Smoker   . Smokeless tobacco: Not on file  . Alcohol Use: No   OB History   Grav Para Term Preterm Abortions TAB SAB Ect Mult Living                 Review of Systemsnegative except as stated in HPI    Allergies  Compazine; Penicillins; Aspirin; Cephalexin; Ketorolac tromethamine; Metoclopramide hcl;  Ondansetron hcl; Sumatriptan; and Tramadol  Home Medications   Prior to Admission medications   Medication Sig Start Date End Date Taking? Authorizing Provider  acetaminophen (TYLENOL) 500 MG tablet Take 500-1,000 mg by mouth daily as needed. pain    Historical Provider, MD  cloNIDine (CATAPRES) 0.1 MG tablet Take 0.1 mg by mouth 2 (two) times daily.  06/08/11   Christiane Haorinna L Sullivan, MD  labetalol (NORMODYNE) 200 MG tablet Take 200 mg by mouth 2 (two) times daily.    Historical Provider, MD  BP 151/104  Pulse 87  Temp(Src) 98.4 F (36.9 C) (Oral)  Resp 18  SpO2 97%  Physical Exam  Nursing note and vitals reviewed. Constitutional: She is oriented to person, place, and time. She appears well-developed and well-nourished.  HENT:  Head: Normocephalic.  Eyes: EOM are normal.  Neck: Neck supple.  Cardiovascular: Normal rate.   Pulmonary/Chest: Effort normal.    Vesicular lesions that are painful under left breast. Consistent with zoster.   Abdominal: Soft.  Musculoskeletal: Normal range of motion.  Neurological: She is alert and oriented to person, place, and time. No cranial nerve deficit.  Skin: Skin is warm and dry.  Psychiatric: She has a normal mood and affect. Her behavior  is normal.    ED Course: Dr. Fonnie JarvisBednar in to see the patient.   Procedures   MDM  53 y.o. female with lesions under left breast consistent with zoster. Patient stable for discharge with treatment for pain. Discussed with the patient and all questioned fully answered. She will return if any problems arise.    Medication List    TAKE these medications       oxyCODONE-acetaminophen 7.5-325 MG per tablet  Commonly known as:  PERCOCET  Take 1 tablet by mouth every 6 (six) hours as needed for pain.     valACYclovir 1000 MG tablet  Commonly known as:  VALTREX  Take 1 tablet (1,000 mg total) by mouth 3 (three) times daily.      ASK your doctor about these medications       acetaminophen 500 MG tablet    Commonly known as:  TYLENOL  Take 500-1,000 mg by mouth daily as needed. pain     cloNIDine 0.1 MG tablet  Commonly known as:  CATAPRES  Take 0.1 mg by mouth 2 (two) times daily.     labetalol 200 MG tablet  Commonly known as:  NORMODYNE  Take 200 mg by mouth 2 (two) times daily.          [facility] Orlene OchM Zierra Laroque, [place] 03/30/14 1900

## 2014-04-04 NOTE — ED Provider Notes (Signed)
Medical screening examination/treatment/procedure(s) were performed by non-physician practitioner and as supervising physician I was immediately available for consultation/collaboration.   Zaviyar Rahal M Koralyn Prestage, MD 04/04/14 2218 

## 2014-04-10 ENCOUNTER — Emergency Department (HOSPITAL_COMMUNITY)
Admission: EM | Admit: 2014-04-10 | Discharge: 2014-04-10 | Disposition: A | Discharge status: Home / Self Care | Payer: Medicare Other | Attending: Emergency Medicine | Admitting: Emergency Medicine

## 2014-04-10 ENCOUNTER — Encounter (HOSPITAL_COMMUNITY): Payer: Self-pay | Admitting: Emergency Medicine

## 2014-04-10 DIAGNOSIS — Z79899 Other long term (current) drug therapy: Secondary | ICD-10-CM | POA: Insufficient documentation

## 2014-04-10 DIAGNOSIS — Z8719 Personal history of other diseases of the digestive system: Secondary | ICD-10-CM | POA: Insufficient documentation

## 2014-04-10 DIAGNOSIS — I10 Essential (primary) hypertension: Secondary | ICD-10-CM | POA: Insufficient documentation

## 2014-04-10 DIAGNOSIS — B029 Zoster without complications: Secondary | ICD-10-CM

## 2014-04-10 DIAGNOSIS — R509 Fever, unspecified: Secondary | ICD-10-CM | POA: Insufficient documentation

## 2014-04-10 DIAGNOSIS — F319 Bipolar disorder, unspecified: Secondary | ICD-10-CM | POA: Insufficient documentation

## 2014-04-10 DIAGNOSIS — Z862 Personal history of diseases of the blood and blood-forming organs and certain disorders involving the immune mechanism: Secondary | ICD-10-CM | POA: Insufficient documentation

## 2014-04-10 DIAGNOSIS — Z880 Allergy status to penicillin: Secondary | ICD-10-CM | POA: Insufficient documentation

## 2014-04-10 DIAGNOSIS — B009 Herpesviral infection, unspecified: Secondary | ICD-10-CM | POA: Insufficient documentation

## 2014-04-10 DIAGNOSIS — G43909 Migraine, unspecified, not intractable, without status migrainosus: Secondary | ICD-10-CM | POA: Insufficient documentation

## 2014-04-10 DIAGNOSIS — F411 Generalized anxiety disorder: Secondary | ICD-10-CM | POA: Insufficient documentation

## 2014-04-10 DIAGNOSIS — R110 Nausea: Secondary | ICD-10-CM | POA: Insufficient documentation

## 2014-04-10 DIAGNOSIS — Z8639 Personal history of other endocrine, nutritional and metabolic disease: Secondary | ICD-10-CM | POA: Insufficient documentation

## 2014-04-10 DIAGNOSIS — H538 Other visual disturbances: Secondary | ICD-10-CM | POA: Insufficient documentation

## 2014-04-10 DIAGNOSIS — G8929 Other chronic pain: Secondary | ICD-10-CM | POA: Insufficient documentation

## 2014-04-10 HISTORY — DX: Zoster without complications: B02.9

## 2014-04-10 MED ORDER — MORPHINE SULFATE 4 MG/ML IJ SOLN
4.0000 mg | Freq: Once | INTRAMUSCULAR | Status: AC
  Administered 2014-04-10: 4 mg via INTRAMUSCULAR
  Filled 2014-04-10: qty 1

## 2014-04-10 MED ORDER — CLOTRIMAZOLE-BETAMETHASONE 1-0.05 % EX CREA: TOPICAL_CREAM | CUTANEOUS | Status: AC

## 2014-04-10 MED ORDER — ACYCLOVIR 400 MG PO TABS: 400.0000 mg | ORAL_TABLET | Freq: Four times a day (QID) | ORAL | Status: DC

## 2014-04-10 MED ORDER — ACYCLOVIR 800 MG PO TABS: 800.0000 mg | ORAL_TABLET | Freq: Every day | ORAL | Status: AC

## 2014-04-10 MED ORDER — OXYCODONE-ACETAMINOPHEN 5-325 MG PO TABS: 2.0000 | ORAL_TABLET | ORAL | Status: AC | PRN

## 2014-04-10 MED ORDER — GABAPENTIN 300 MG PO CAPS: ORAL_CAPSULE | ORAL | Status: AC

## 2014-04-10 MED ORDER — HYDROXYZINE HCL 50 MG/ML IM SOLN: 25.0000 mg | Freq: Once | INTRAMUSCULAR | Status: DC

## 2014-04-10 MED ORDER — OXYCODONE-ACETAMINOPHEN 5-325 MG PO TABS: 2.0000 | ORAL_TABLET | ORAL | Status: DC | PRN

## 2014-04-10 MED ORDER — HYDROXYZINE HCL 25 MG PO TABS
50.0000 mg | ORAL_TABLET | Freq: Once | ORAL | Status: AC
  Administered 2014-04-10: 50 mg via ORAL
  Filled 2014-04-10: qty 2

## 2014-04-10 NOTE — ED Provider Notes (Signed)
CSN: [id number]     Arrival date & time 04/10/14  1317 History  This chart was scribed for [name]. Orlandis Sanden, MD by Joaquin MusicKristina Sanchez-Matthews, ED Scribe. This patient was seen in room APA07/[facility]07 and the patient's care was started at 1:55 PM.   Chief Complaint  Patient presents with  . Herpes Zoster   The history is provided by the patient. No language interpreter was used.   HPI Comments: [name] is a 53 y.o. female who presents to the Emergency Department complaining of shingles flare-up with associated intermittent blurred vision that began 7 days ago. Pt states she was seen a few days ago, was given ani-viral x 5 days and Oxycodone medication. States her pain worsened yesterday and developed a new blister under her L breast. She states she has been nauseated, itching and states she has a large blister to the center of her back. Denies emesis.  Past Medical History  Diagnosis Date  . Hypertension   . Bipolar 1 disorder   . Depression   . Hypercholesteremia   . Acid reflux   . Chronic pain   . Stomach ulcer   . Headache(784.0)   . Anxiety   . Crohn's disease   . Seizures   . Migraine   . Shingles    Past Surgical History  Procedure Laterality Date  . Cholecystectomy    . Tonsillectomy    . Stomach surgery    . Tubal ligation     Family History  Problem Relation Age of Onset  . Hypertension Father   . Diabetes type II Brother   . Hypertension Brother   . Diabetes type II Mother    History  Substance Use Topics  . Smoking status: Never Smoker   . Smokeless tobacco: Not on file  . Alcohol Use: No   OB History   Grav Para Term Preterm Abortions TAB SAB Ect Mult Living                 Review of Systems  Constitutional: Positive for fever.  Eyes: Positive for visual disturbance.  Gastrointestinal: Positive for nausea. Negative for vomiting.  Skin: Positive for color change and rash.  All other systems reviewed and are negative.  Allergies   Compazine; Penicillins; Aspirin; Cephalexin; Ketorolac tromethamine; Metoclopramide hcl; Ondansetron hcl; Sumatriptan; and Tramadol  Home Medications   Prior to Admission medications   Medication Sig Start Date End Date Taking? Authorizing Provider  acetaminophen (TYLENOL) 500 MG tablet Take 500-1,000 mg by mouth daily as needed. pain    Historical Provider, MD  cloNIDine (CATAPRES) 0.1 MG tablet Take 0.1 mg by mouth 2 (two) times daily.  06/08/11   Christiane Haorinna L Sullivan, MD  labetalol (NORMODYNE) 200 MG tablet Take 200 mg by mouth 2 (two) times daily.    Historical Provider, MD  oxyCODONE-acetaminophen (PERCOCET) 7.5-325 MG per tablet Take 1 tablet by mouth every 6 (six) hours as needed for pain. 03/30/14   Hope Orlene OchM Neese, NP  valACYclovir (VALTREX) 1000 MG tablet Take 1 tablet (1,000 mg total) by mouth 3 (three) times daily. 03/30/14 04/13/14  Hope Orlene OchM Neese, NP   Pulse 94  Temp(Src) 98 F (36.7 C) (Oral)  Resp 18  SpO2 96%  Physical Exam  Constitutional: She is oriented to person, place, and time. She appears well-developed and well-nourished. No distress.  HENT:  Head: Normocephalic and atraumatic.  Right Ear: Hearing normal.  Left Ear: Hearing normal.  Nose: Nose normal.  Mouth/Throat: Oropharynx is clear  and moist and mucous membranes are normal.  Eyes: Conjunctivae and EOM are normal. Pupils are equal, round, and reactive to light.  Neck: Normal range of motion. Neck supple.  Cardiovascular: Regular rhythm, S1 normal and S2 normal.  Exam reveals no gallop and no friction rub.   No murmur heard. Pulmonary/Chest: Effort normal and breath sounds normal. No respiratory distress. She exhibits no tenderness.  Abdominal: Soft. Normal appearance and bowel sounds are normal. There is no hepatosplenomegaly. There is no tenderness. There is no rebound, no guarding, no tenderness at McBurney's point and negative Murphy's sign. No hernia.  Musculoskeletal: Normal range of motion.  Neurological: She is  alert and oriented to person, place, and time. She has normal strength. No cranial nerve deficit or sensory deficit. Coordination normal. GCS eye subscore is 4. GCS verbal subscore is 5. GCS motor subscore is 6.  Skin: Skin is warm, dry and intact. No rash noted. No cyanosis.  3 small ulcerated areas under L breast with small amount of erythema and no drainage.  Psychiatric: She has a normal mood and affect. Her speech is normal and behavior is normal. Thought content normal.   ED Course  Procedures  DIAGNOSTIC STUDIES: Oxygen Saturation is 96% on RA, normal by my interpretation.    COORDINATION OF CARE: 2:00 PM-Discussed treatment plan which includes discharge pt with anti-viral medication. Encouraged pt to F/U with PCP for Herpes Zoster vaccine. Pt agreed to plan.   Labs Review Labs Reviewed - No data to display  Imaging Review No results found.   EKG Interpretation None      MDM   Final diagnoses:  Herpes zoster    Patient previously diagnosed with herpes zoster on the left chest and under the breast. She reports she took 5 days of acyclovir and symptoms improved, now are starting to come back. She reports itching, burning and pain with no blisters. The area is inflamed along the intertriginous region under the breasts. There are 2 small ulcerated areas, possibly areas of previous vesicles. No drainage, no purulence. Patient will require treatment for possible tinea in this region as well as recurrent treatment for herpes zoster.  I personally performed the services described in this documentation, which was scribed in my presence. The recorded information has been reviewed and is accurate.    [name]. Melaine Mcphee, MD 04/10/14 [phone]

## 2014-04-10 NOTE — ED Notes (Signed)
Pt states she was seen here 6-7 days ago and dx with shingles. Pt states she finished her medications and still has symptoms. Pt reports blister-like areas to back and under left breast.

## 2014-04-10 NOTE — Discharge Instructions (Signed)
Shingles  Shingles is caused by the same virus that causes chickenpox. The first feelings may be pain or tingling. A rash will follow in a couple days. The rash may occur on any area of the body. Long-lasting pain is more likely in an elderly person. It can last months to years. There are medicines that can help prevent pain if you start taking them early.  HOME CARE    Place cool cloths on the rash.   Only take medicine as told by your doctor.   You may use calamine lotion to relieve itchy skin.   Avoid touching:   Babies.   Children with inflamed skin (eczema).   People who have gotten transplanted organs.   People with chronic illnesses, such as leukemia and AIDS.   If the rash is on the face, you may need to see a specialist. Keep all appointments. Shingles must be kept away from the eyes, if possible.   Keep all appointments.   Avoid touching the eyes or eye area, if possible.  GET HELP RIGHT AWAY IF:    You have any pain on the face or eye.   Your medicines do not help.   Your redness or puffiness (swelling) spreads.   You have a fever.   You notice any red lines going away from the rash area.  MAKE SURE YOU:    Understand these instructions.   Will watch your condition.   Will get help right away if you are not doing well or get worse.  Document Released: 05/04/2008 Document Revised: 02/08/2012 Document Reviewed: 05/04/2008  [employer] Patient Information 2014 [employer]

## 2014-05-30 DEATH — deceased
